# Patient Record
Sex: Male | Born: 1935 | Race: White | Hispanic: No | Marital: Married | Smoking: Never smoker
Health system: Southern US, Community
[De-identification: ages and names within clinical notes are randomized; demographics above are authoritative.]

## PROBLEM LIST (undated history)

## (undated) DIAGNOSIS — G4730 Sleep apnea, unspecified: Secondary | ICD-10-CM

## (undated) DIAGNOSIS — M1990 Unspecified osteoarthritis, unspecified site: Secondary | ICD-10-CM

## (undated) DIAGNOSIS — C61 Malignant neoplasm of prostate: Secondary | ICD-10-CM

## (undated) DIAGNOSIS — J449 Chronic obstructive pulmonary disease, unspecified: Secondary | ICD-10-CM

## (undated) DIAGNOSIS — F32A Depression, unspecified: Secondary | ICD-10-CM

## (undated) DIAGNOSIS — H409 Unspecified glaucoma: Secondary | ICD-10-CM

## (undated) DIAGNOSIS — F0390 Unspecified dementia without behavioral disturbance: Secondary | ICD-10-CM

## (undated) DIAGNOSIS — F329 Major depressive disorder, single episode, unspecified: Secondary | ICD-10-CM

## (undated) DIAGNOSIS — R55 Syncope and collapse: Secondary | ICD-10-CM

## (undated) HISTORY — DX: Depression, unspecified: F32.A

## (undated) HISTORY — DX: Unspecified glaucoma: H40.9

## (undated) HISTORY — DX: Unspecified osteoarthritis, unspecified site: M19.90

## (undated) HISTORY — DX: Syncope and collapse: R55

## (undated) HISTORY — DX: Chronic obstructive pulmonary disease, unspecified: J44.9

## (undated) HISTORY — DX: Major depressive disorder, single episode, unspecified: F32.9

## (undated) HISTORY — PX: HERNIA REPAIR: SHX51

## (undated) HISTORY — DX: Sleep apnea, unspecified: G47.30

---

## 1998-08-30 ENCOUNTER — Ambulatory Visit (HOSPITAL_BASED_OUTPATIENT_CLINIC_OR_DEPARTMENT_OTHER): Admission: RE | Admit: 1998-08-30 | Discharge: 1998-08-30 | Payer: Self-pay | Admitting: Surgery

## 2003-08-27 ENCOUNTER — Encounter: Payer: Self-pay | Admitting: Gastroenterology

## 2003-08-27 ENCOUNTER — Encounter: Admission: RE | Admit: 2003-08-27 | Discharge: 2003-08-27 | Payer: Self-pay | Admitting: Gastroenterology

## 2005-10-02 ENCOUNTER — Encounter: Admission: RE | Admit: 2005-10-02 | Discharge: 2005-10-02 | Payer: Self-pay | Admitting: Emergency Medicine

## 2005-10-02 IMAGING — CR DG CHEST 2V
2 series · 2 of 2 positions shown · non-contrast
Comparison: none

CLINICAL DATA: Cough. 
 PA AND LATERAL CHEST: 
 Lungs are clear.  Heart size is normal.  No effusion.  No focal bony abnormality.

[w chest pa]
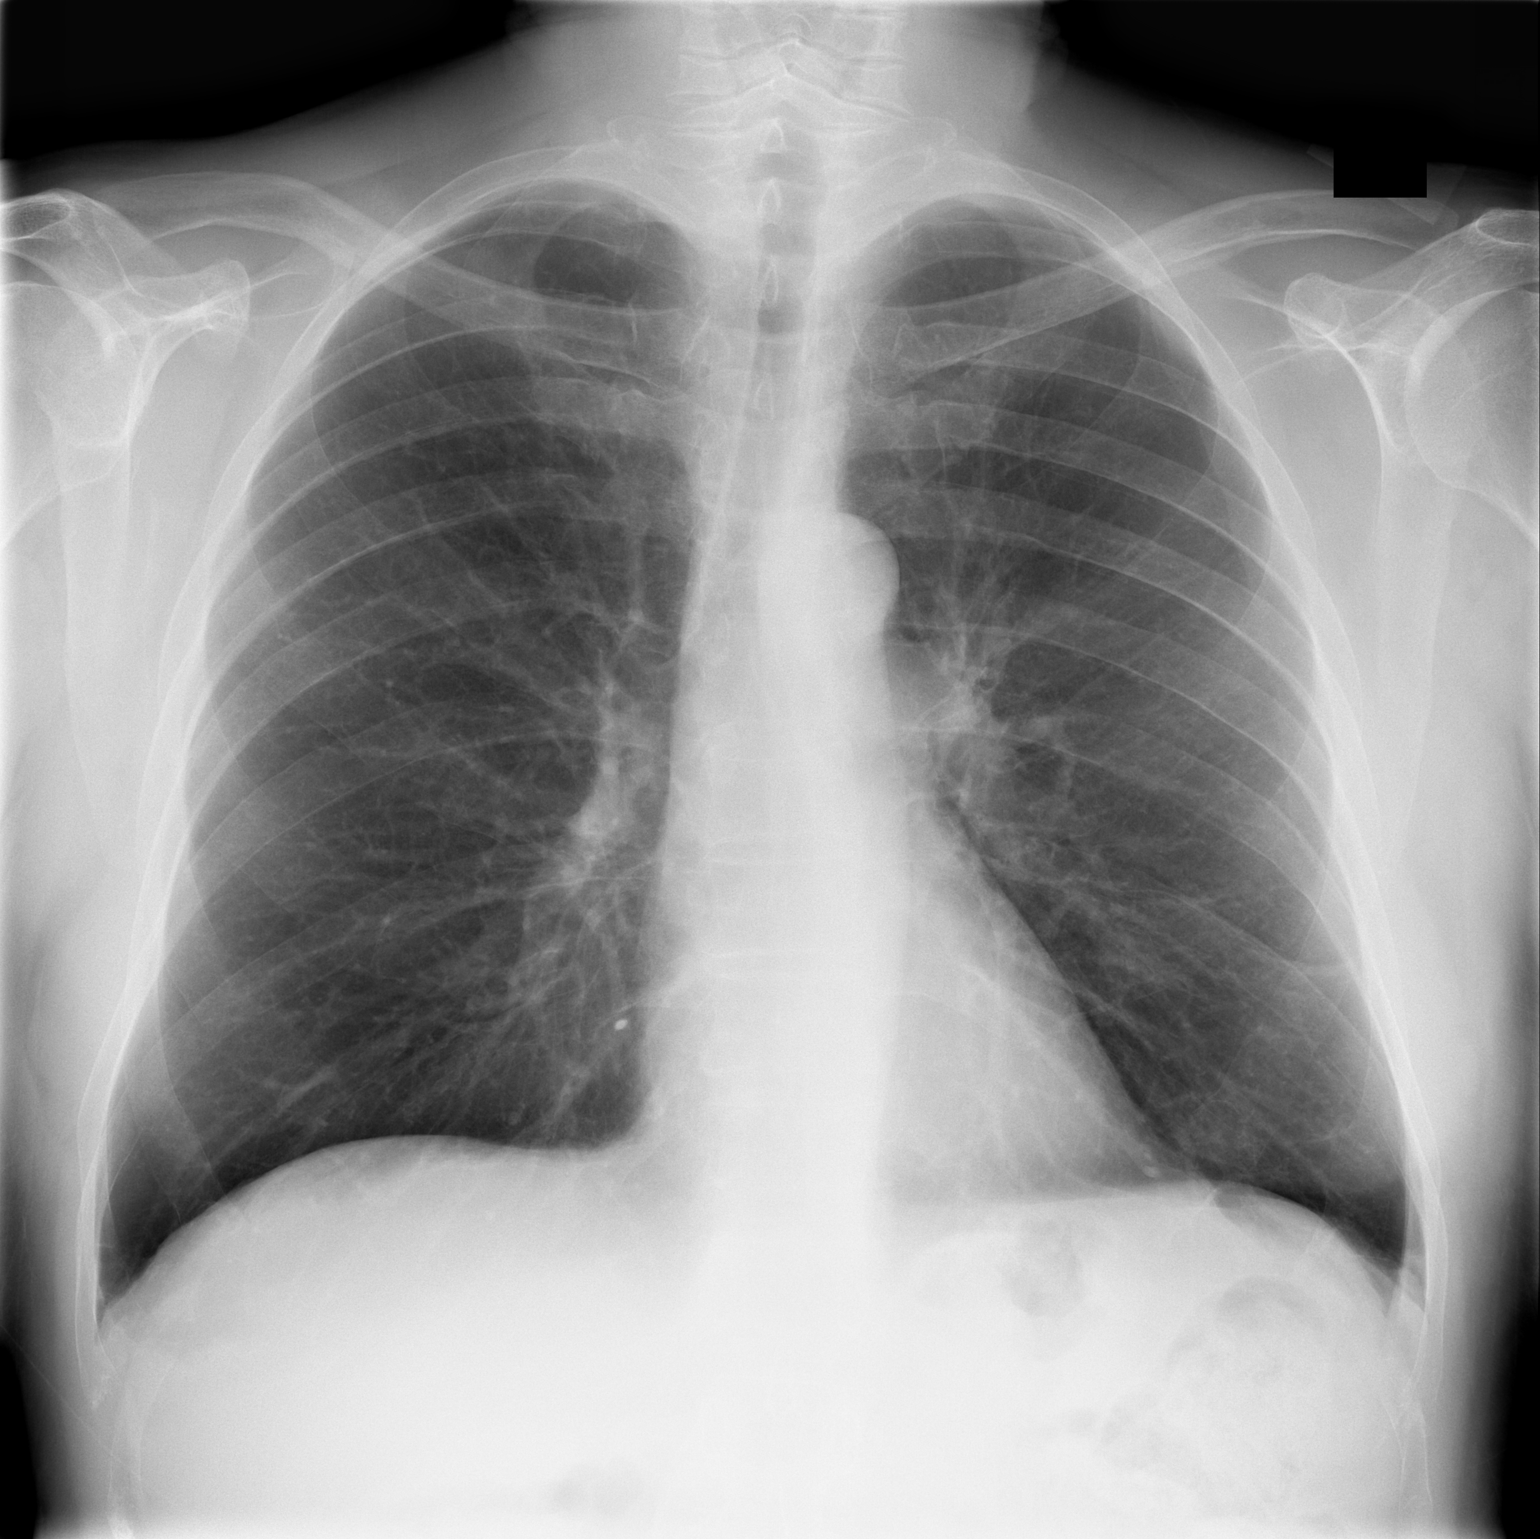

[w chest lat]
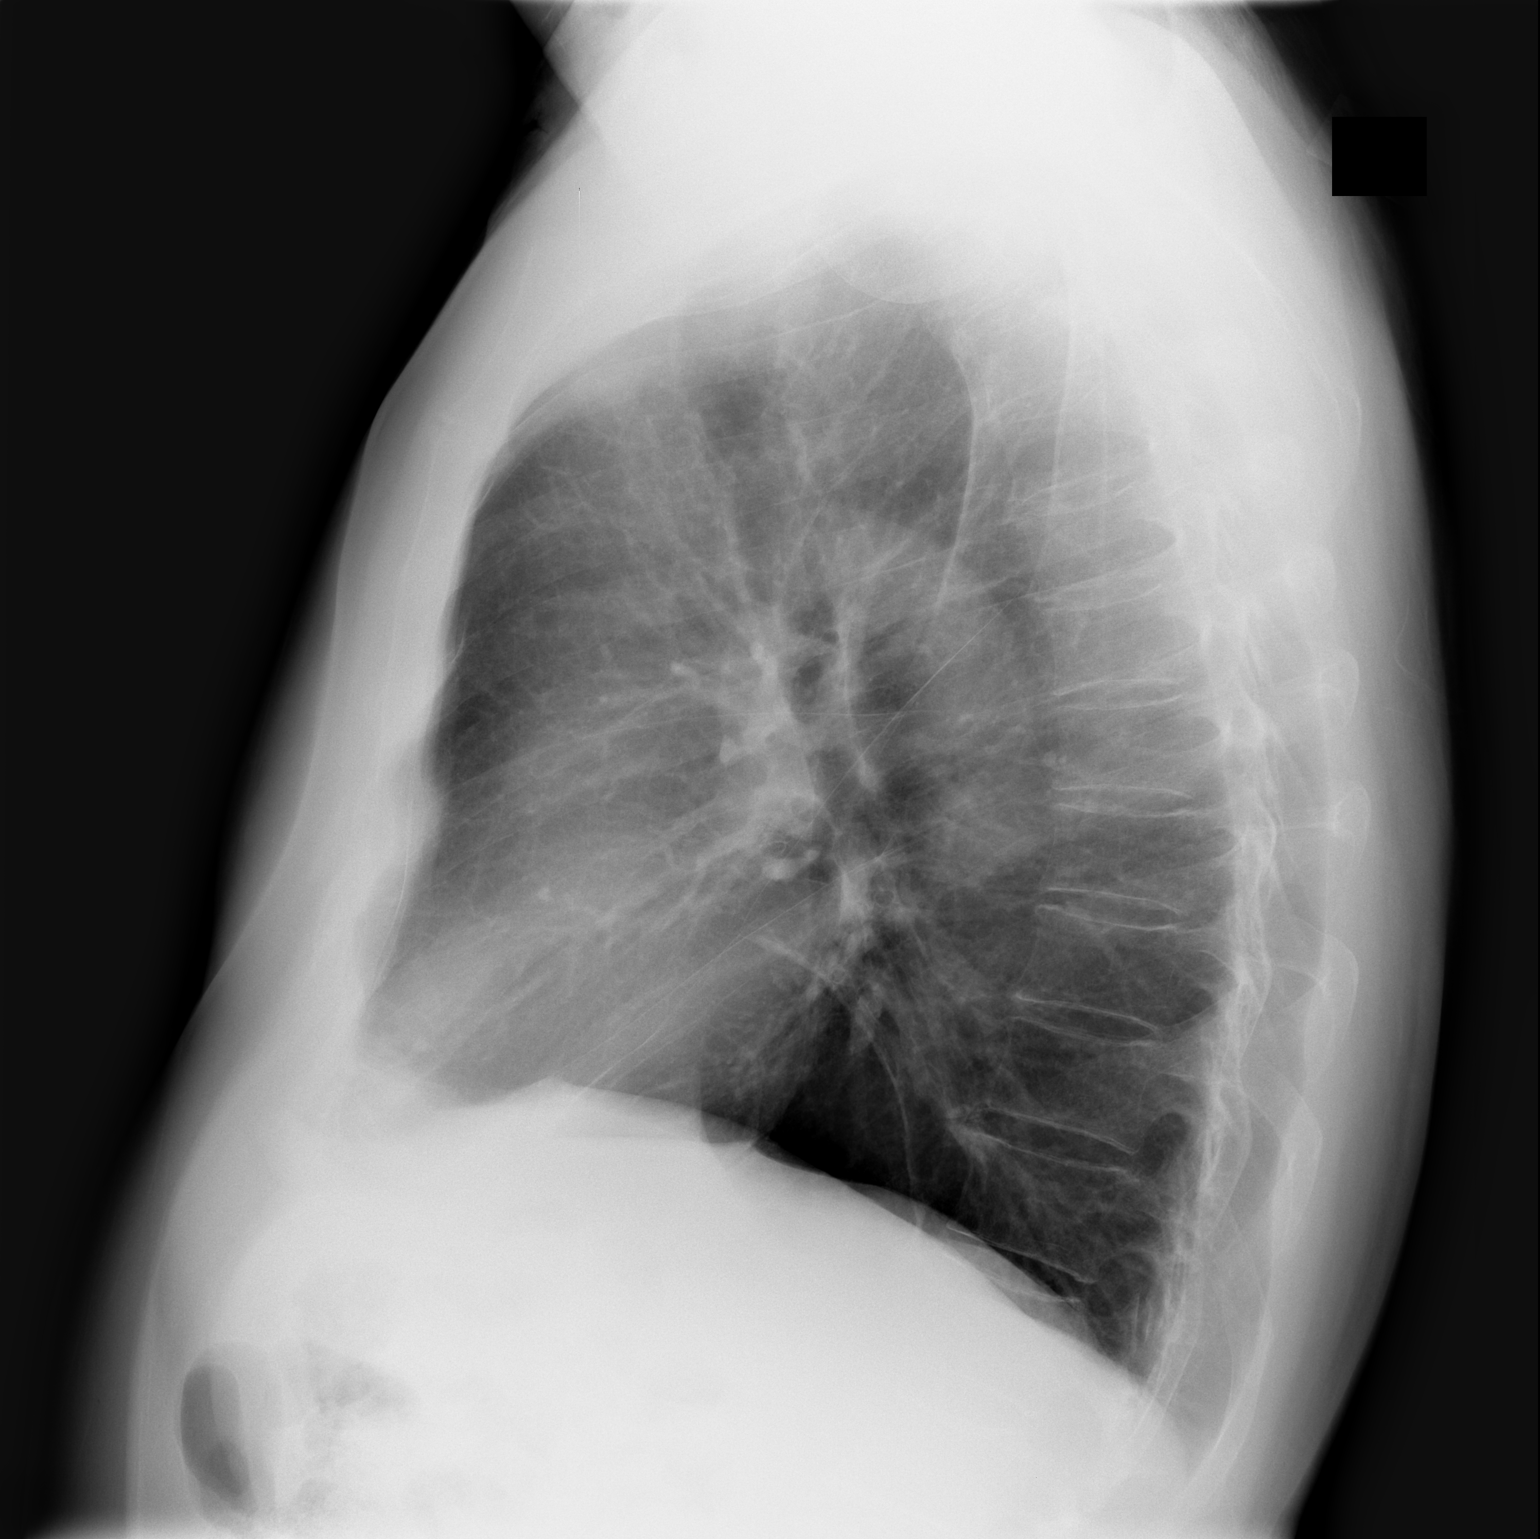

[2 of 2 positions shown; findings below may reference images not displayed]

IMPRESSION: No acute disease.

## 2008-03-26 ENCOUNTER — Encounter: Admission: RE | Admit: 2008-03-26 | Discharge: 2008-03-26 | Payer: Self-pay | Admitting: Emergency Medicine

## 2008-03-26 IMAGING — CT CT VIRTUAL COLONOSCOPY DIAGNOSTIC
2 of 7 series · 12 of 46 positions shown, 14 images · non-contrast
Comparison: None

CLINICAL DATA: The diverticulosis.  Redundant colon on barium
enema.  Diarrhea, screening.

CT VIRTUAL COLONOSCOPY FOR SCREENING:
TECHNIQUE: The patient was given a standard Lo-So bowel
preparation with Gastrografin and barium for fluid and stool
tagging respectively.  The quality of the bowel preparation is
excellent.  Automated CO2 insufflation of the colon was performed
prior to image acquisition and colonic distention is excellent.

[Series 6: prone · axial · 0.78mm/px · z∈[-500,-82]mm · 9 of 418 slices shown, 11 images]
[im 42/418  soft-tissue]
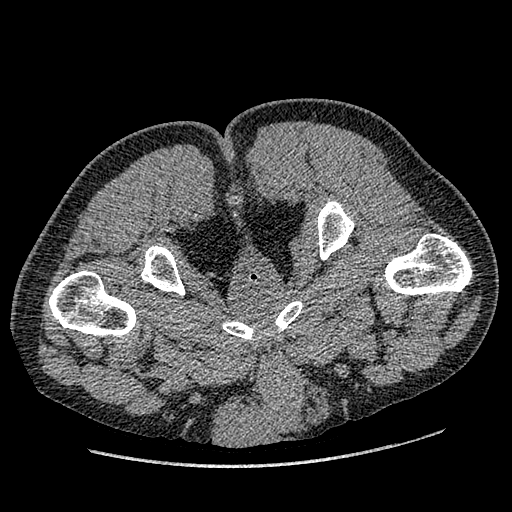
[im 42/418  bone]
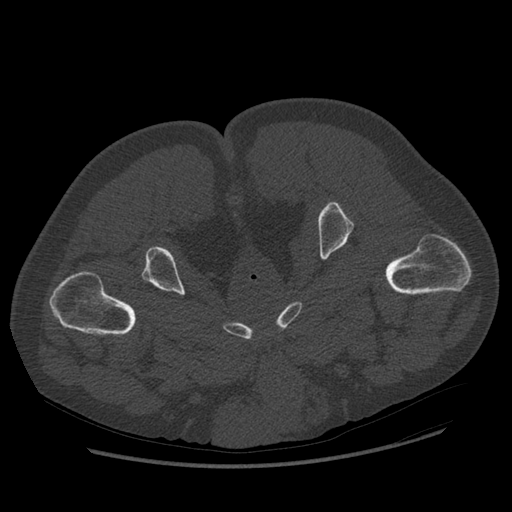
[im 84/418  soft-tissue]
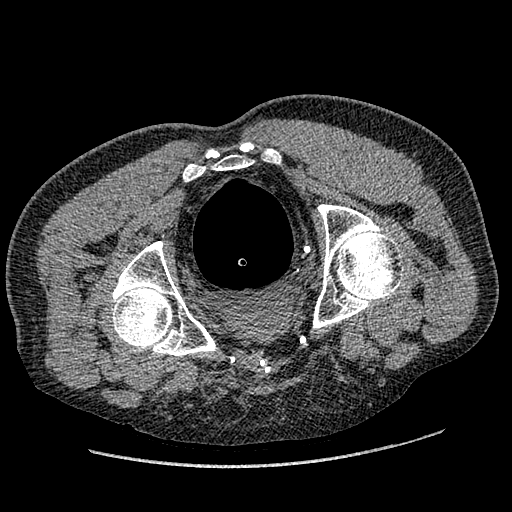
[im 126/418  soft-tissue]
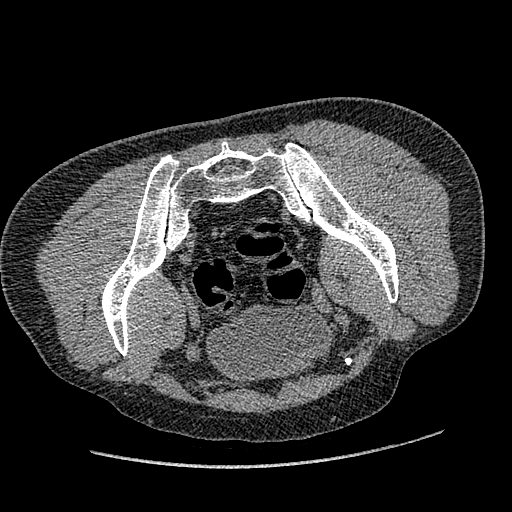
[im 167/418  soft-tissue]
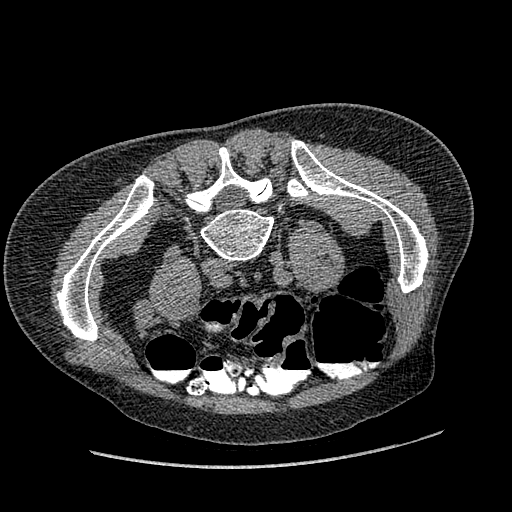
[im 209/418  soft-tissue]
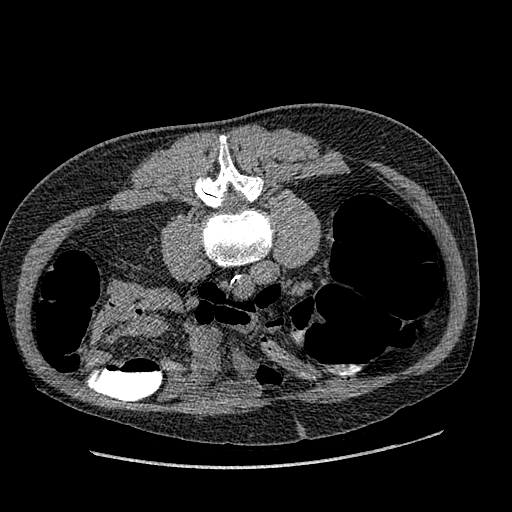
[im 251/418  soft-tissue]
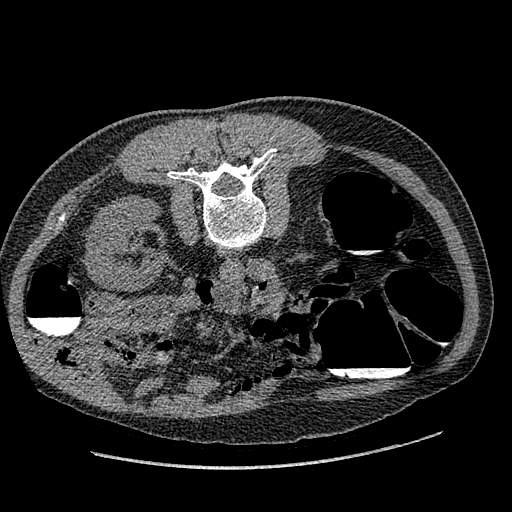
[im 292/418  soft-tissue]
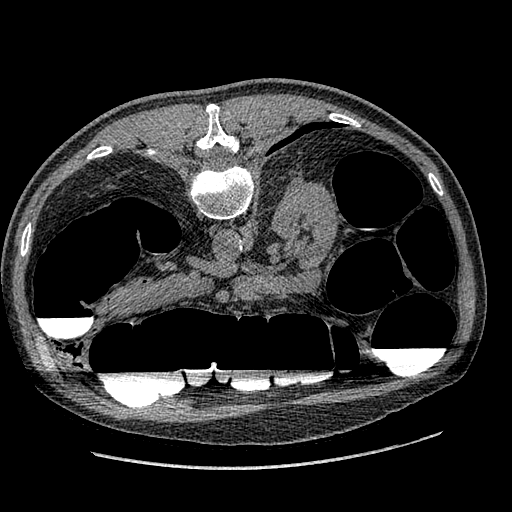
[im 334/418  soft-tissue]
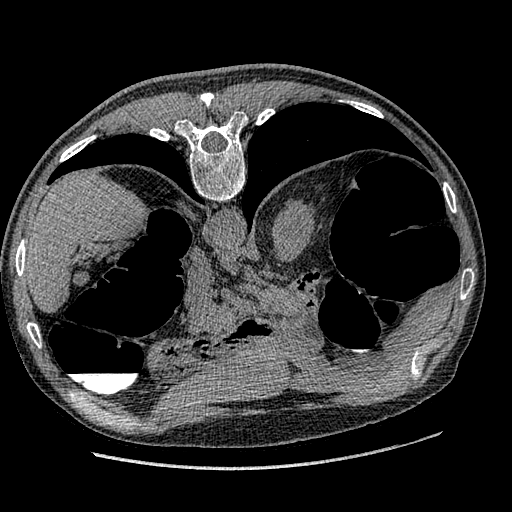
[im 376/418  soft-tissue]
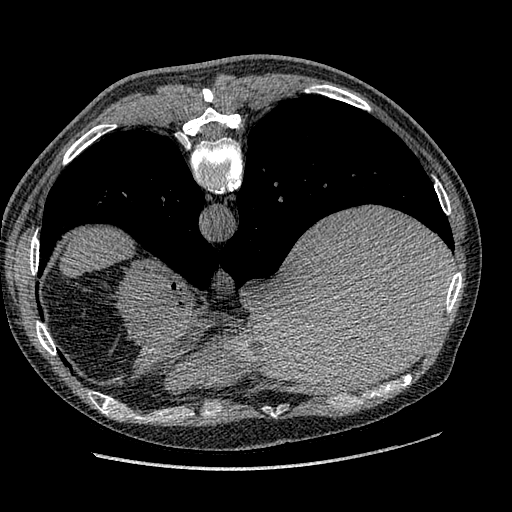
[im 376/418  bone]
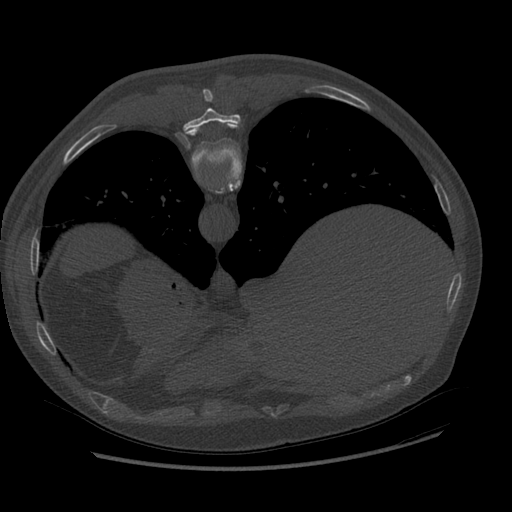

[Series 200: cor · coronal · 0.98mm/px · 3 of 145 slices shown]
[im 49/145  soft-tissue]
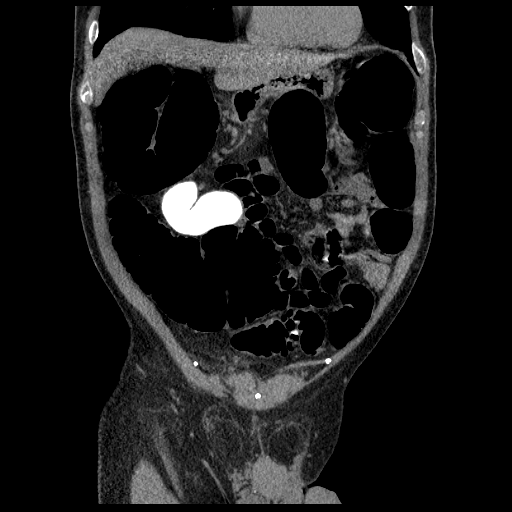
[im 65/145  soft-tissue]
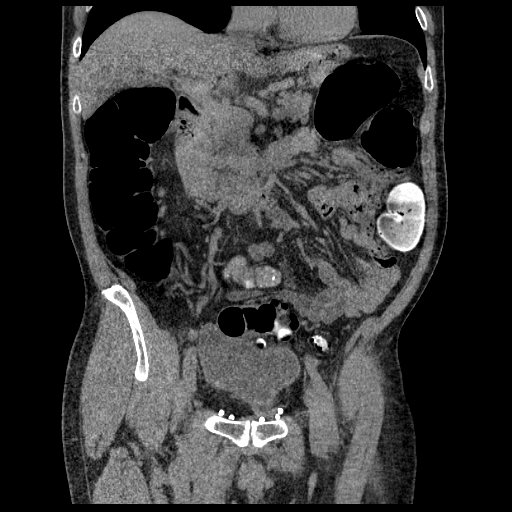
[im 81/145  soft-tissue]
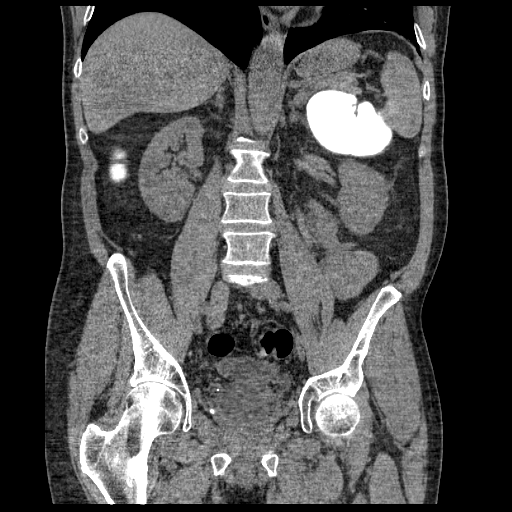

[12 of 46 positions shown; findings below may reference images not displayed]

FINDINGS: On the supine images, 178 cm from the anal verge within
the proximal to mid transverse colon, there is a 7 mm sessile
polypoid area noted only on the supine images.  This area cannot be
confirmed with the prone images.  This does not appear to contain
barium.  This could represent a small 7 mm polyp.

No other suspicious polypoid lesions.  No strictures or masses.
Scattered diverticula throughout the sigmoid and distal descending
colon.
IMPRESSION: Possible 7 mm polyp in the proximal to mid transverse colon 178 cm
from the anal verge.  This is only seen on the supine view.

This is a C-RADS category C2:  Intermediate polyp 6-9 mm.
Recommend continued surveillance or colonoscopy.

Virtual colonoscopy is not designed to detect diminutive polyps
(i.e., less than or equal to 5 mm), the presence or absence of
which may not affect clinical management

CT ABDOMEN AND PELVIS WITHOUT CONTRAST

CT ABDOMEN
FINDINGS: Linear scarring or atelectasis in the left lung base.
Right lung base clear.

Small low-density lesion in the dome of the liver measuring 14 mm,
nonspecific.  This most likely represents a small benign cyst
statistically.  Spleen, pancreas, adrenals unremarkable.  Small
nonobstructing stone in the mid pole of the right kidney.  No
stones on the left.  Kidneys otherwise unremarkable.

Minimal atherosclerotic calcifications in the abdominal aorta.  No
aneurysm.  No free fluid, free air, or adenopathy.
IMPRESSION: 14 mm nonspecific low density lesion in the dome of the liver.
Statistically, this most likely represents small cyst.

Punctate nonobstructing right mid pole renal stone.

CT PELVIS
FINDINGS: Bilateral inguinal hernias present containing fat.  No
free fluid, free air, or adenopathy.
IMPRESSION: Bilateral inguinal hernias containing fat.

## 2008-07-06 ENCOUNTER — Encounter: Admission: RE | Admit: 2008-07-06 | Discharge: 2008-07-06 | Payer: Self-pay | Admitting: Emergency Medicine

## 2008-07-06 IMAGING — CR DG CHEST 2V
2 series · 2 of 2 positions shown · non-contrast
Comparison: Chest x-ray of [DATE]

CLINICAL DATA: Cough, chest pain, smoking history

CHEST - 2 VIEW

[w chest pa]
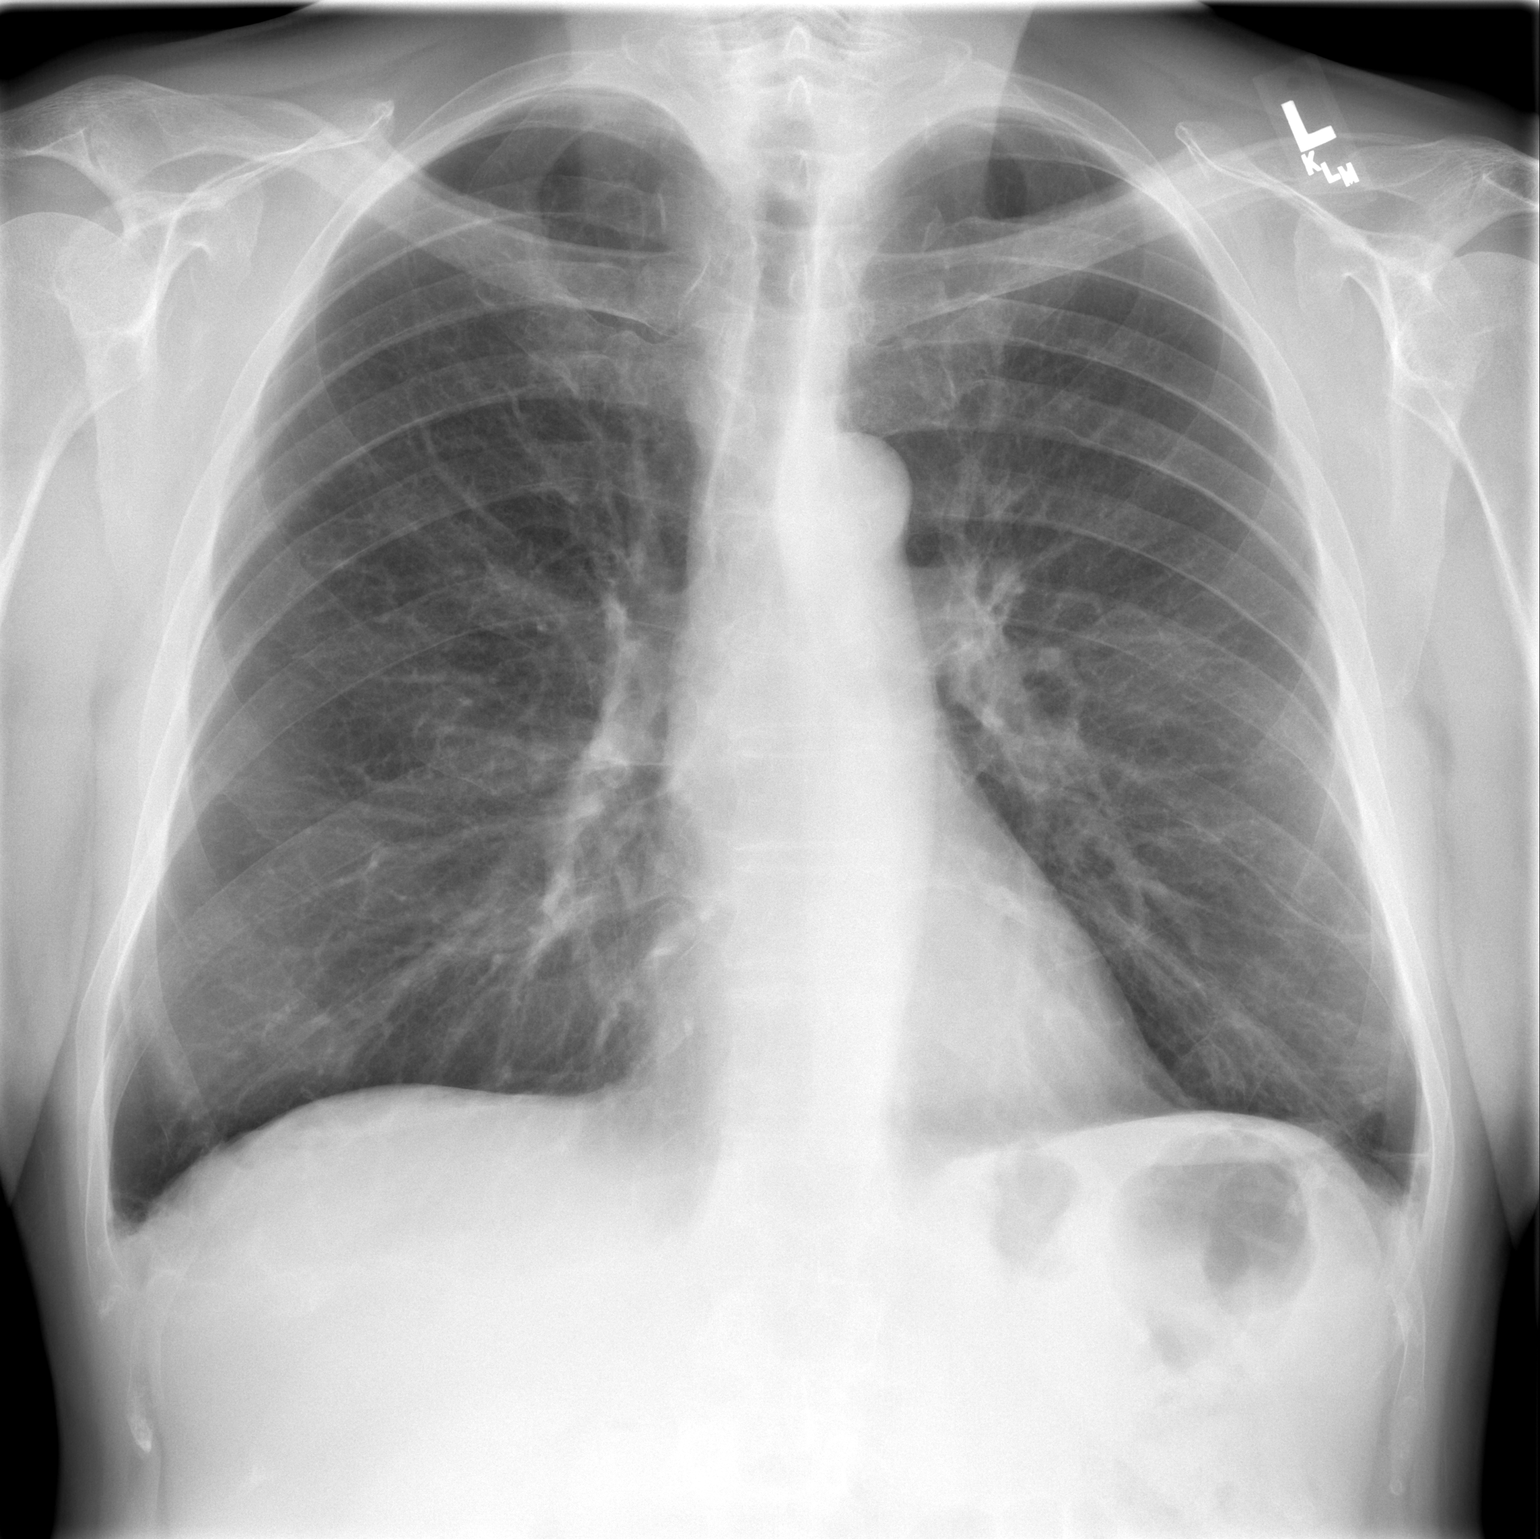

[w chest lat]
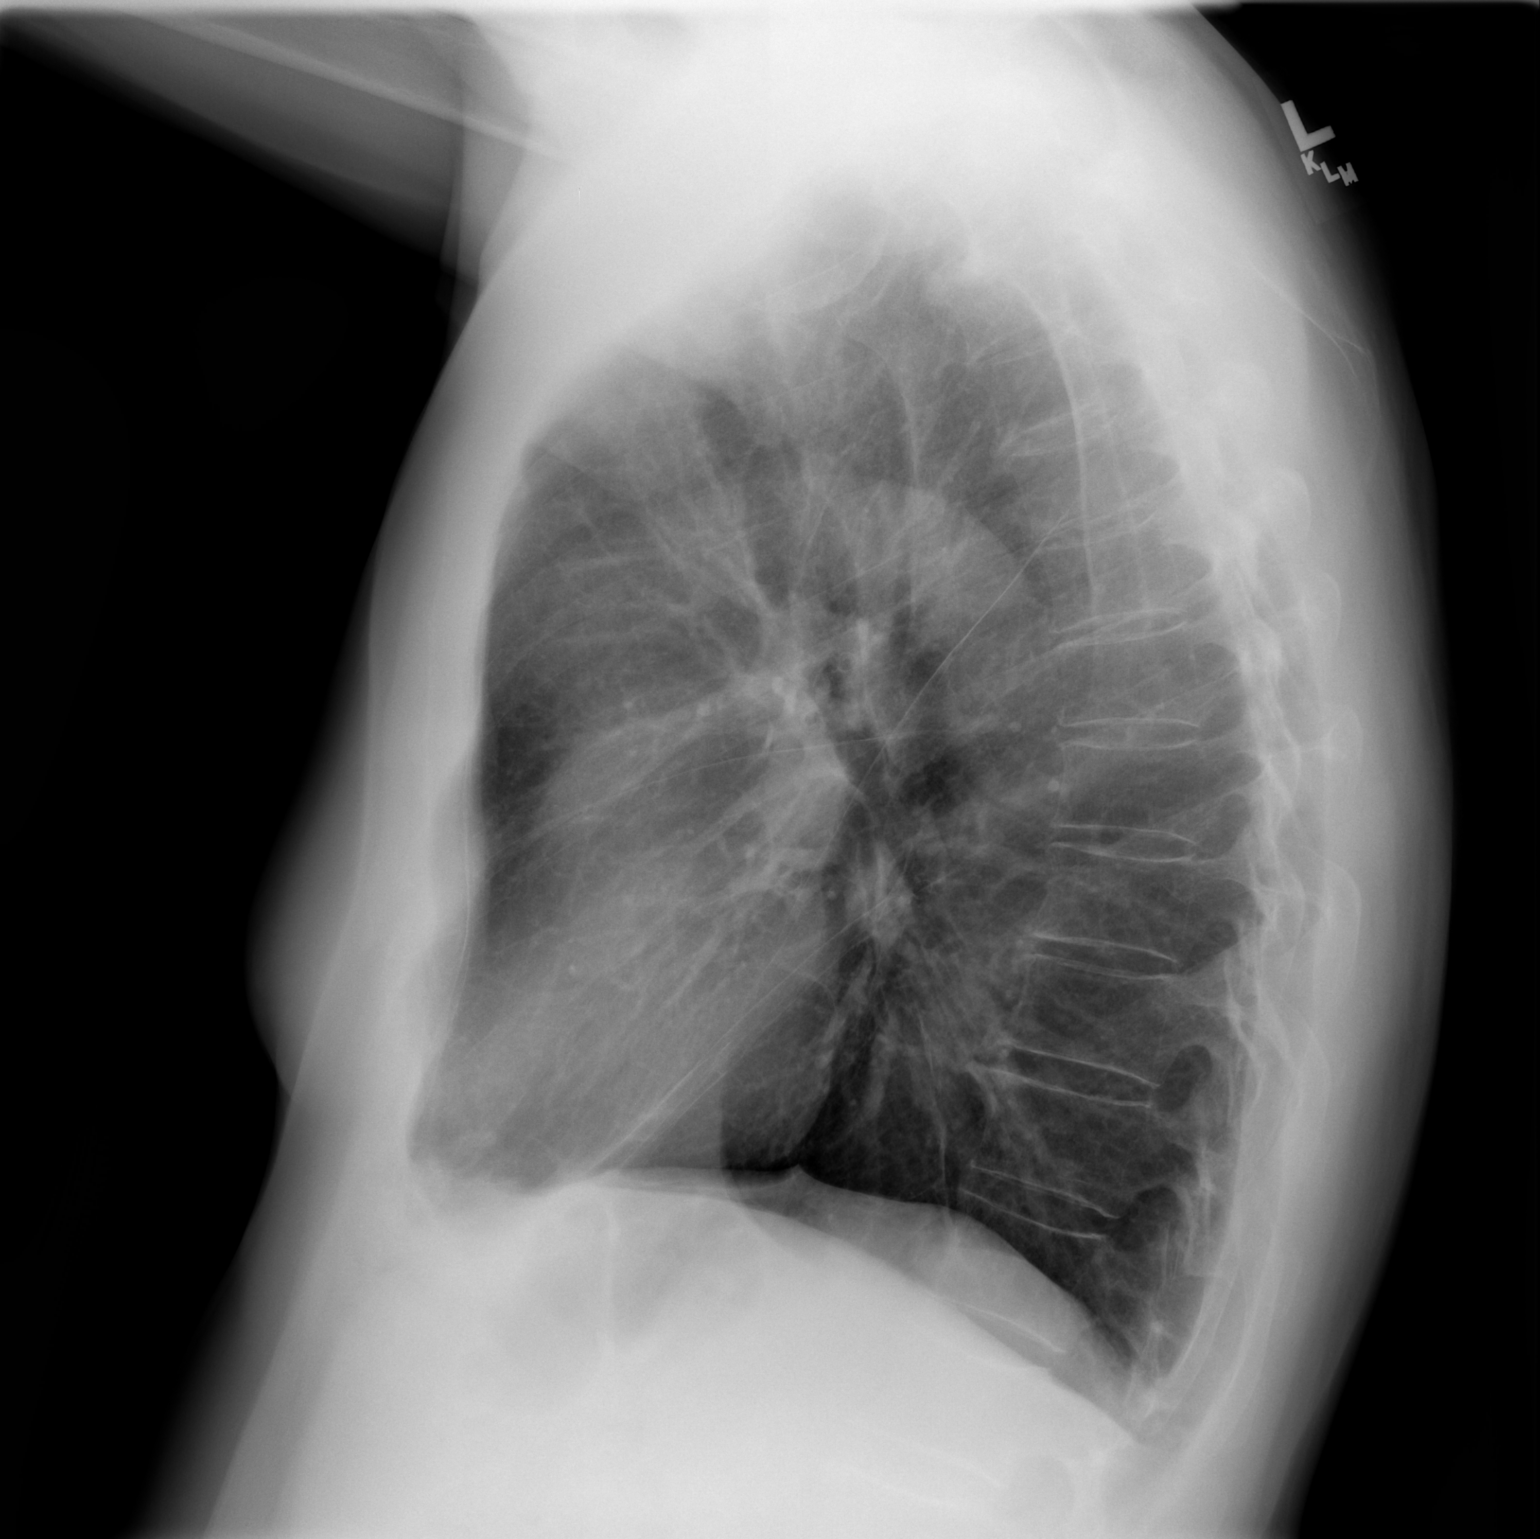

[2 of 2 positions shown; findings below may reference images not displayed]

FINDINGS: The lungs are clear and slightly hyperaerated.  The heart
is within normal limits in size.  No bony abnormality is seen.
IMPRESSION: No active lung disease.  Slight hyperaeration.

## 2008-08-02 ENCOUNTER — Encounter: Admission: RE | Admit: 2008-08-02 | Discharge: 2008-08-02 | Payer: Self-pay | Admitting: Surgery

## 2008-08-02 IMAGING — CT CT PELVIS W/ CM
2 of 3 series · 17 of 46 positions shown, 19 images · IV contrast (READICAT/WATER & [ID] OMNI 300)
Comparison: [DATE]

CLINICAL DATA: Recurrent bilateral inguinal hernias.

CT PELVIS WITH CONTRAST
TECHNIQUE: Multidetector CT imaging of the pelvis was performed
following the standard protocol during administration of
intravenous contrast.
Contrast: 100 ml [3O]

[Series 3: routine pelvis · axial · 0.77mm/px · z∈[-285,-45]mm · 14 of 53 slices shown, 16 images]
[im 4/53  soft-tissue]
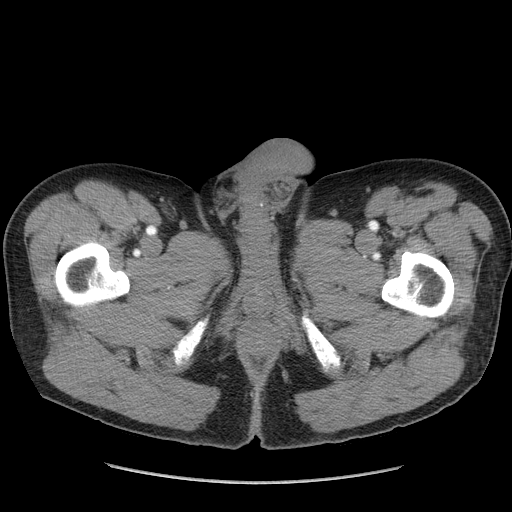
[im 4/53  bone]
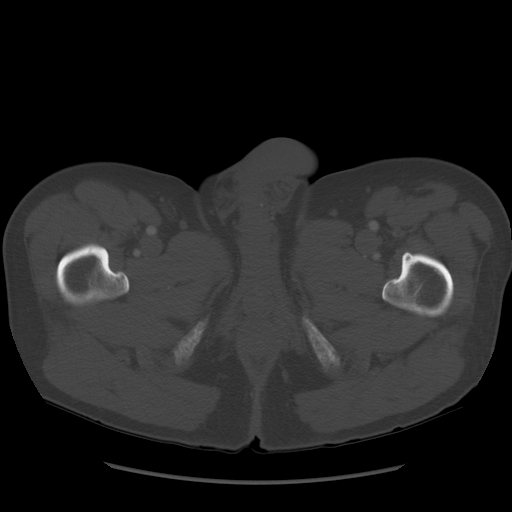
[im 7/53  soft-tissue]
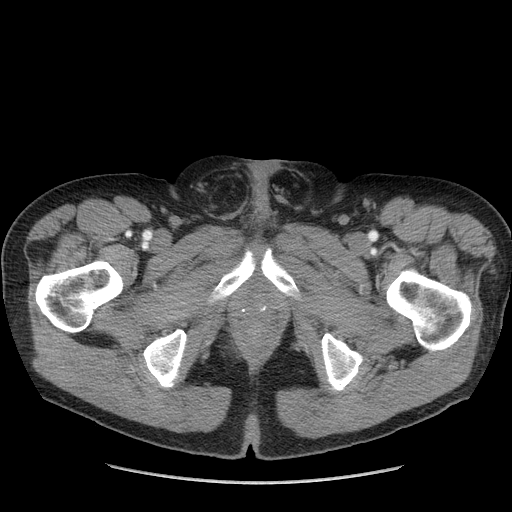
[im 11/53  soft-tissue]
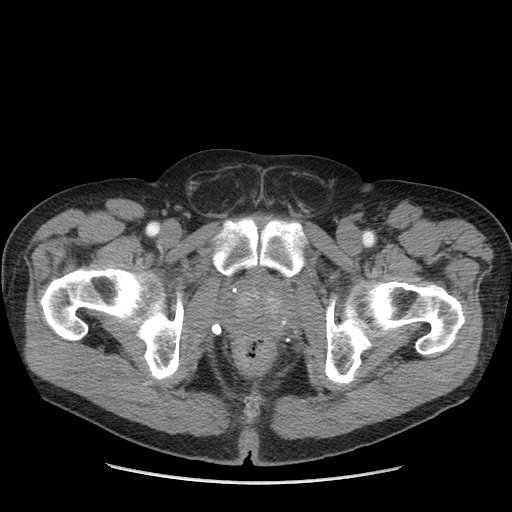
[im 14/53  soft-tissue]
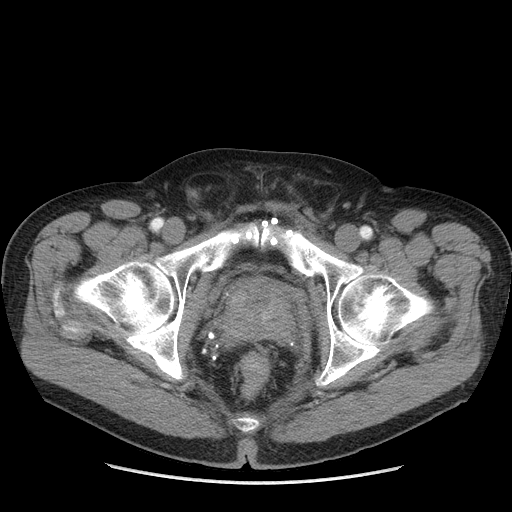
[im 17/53  soft-tissue]
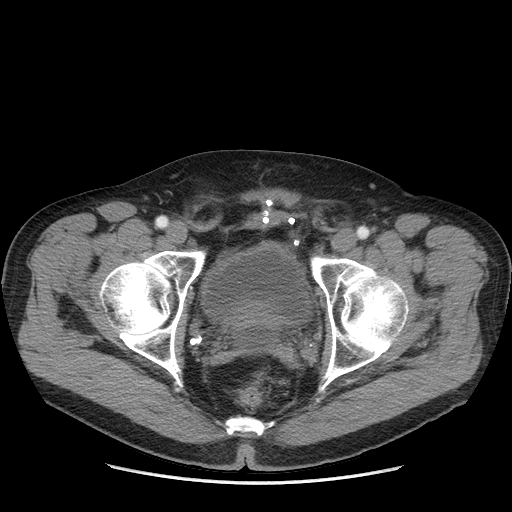
[im 21/53  soft-tissue]
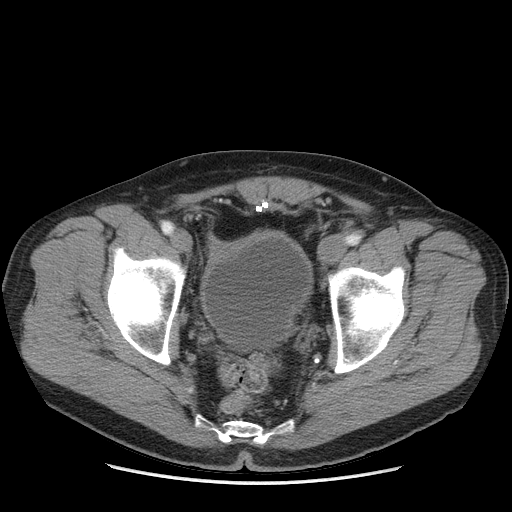
[im 24/53  soft-tissue]
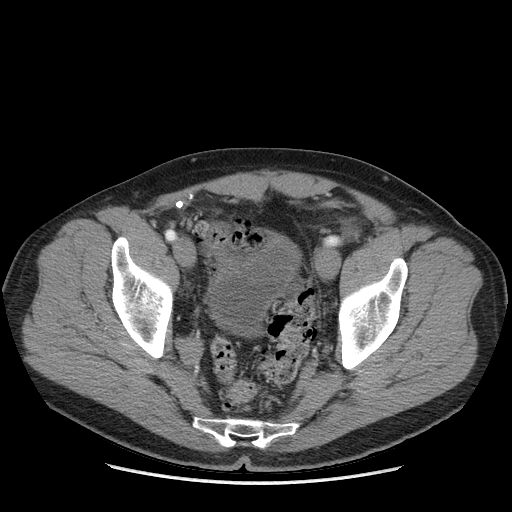
[im 29/53  soft-tissue]
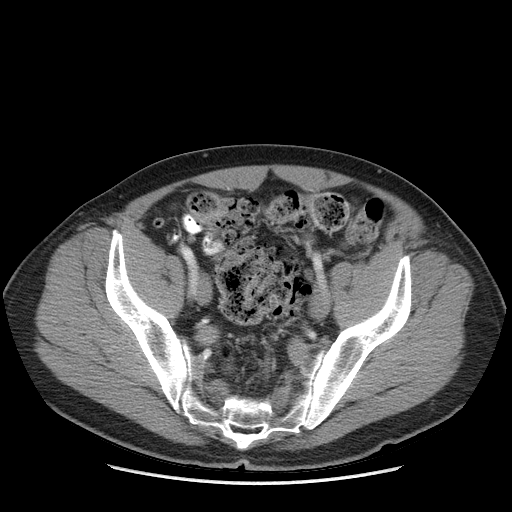
[im 32/53  soft-tissue]
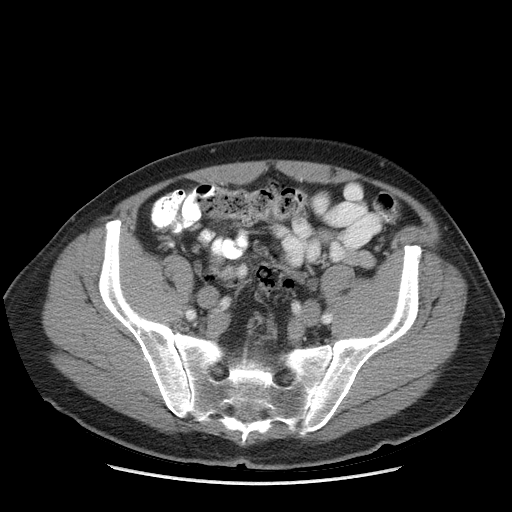
[im 32/53  bone]
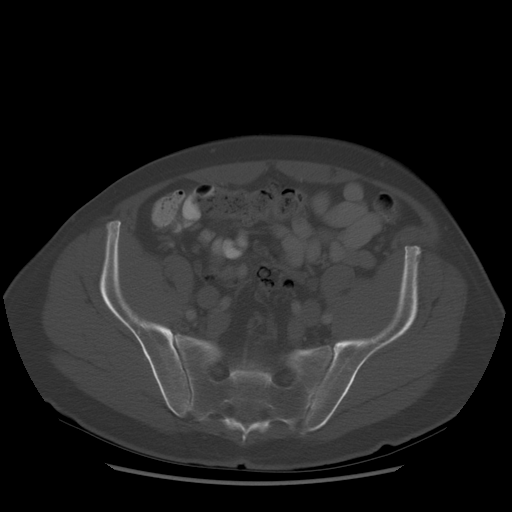
[im 36/53  soft-tissue]
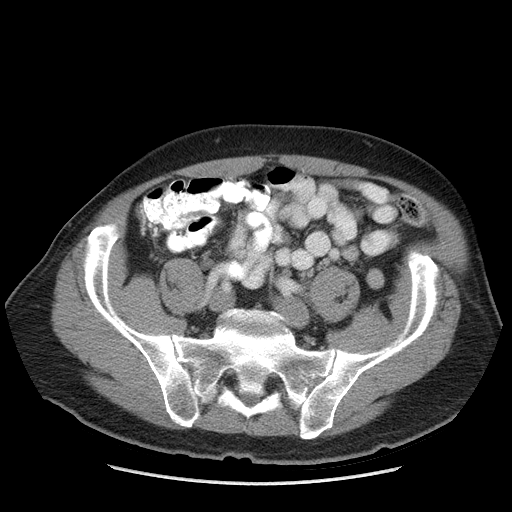
[im 39/53  soft-tissue]
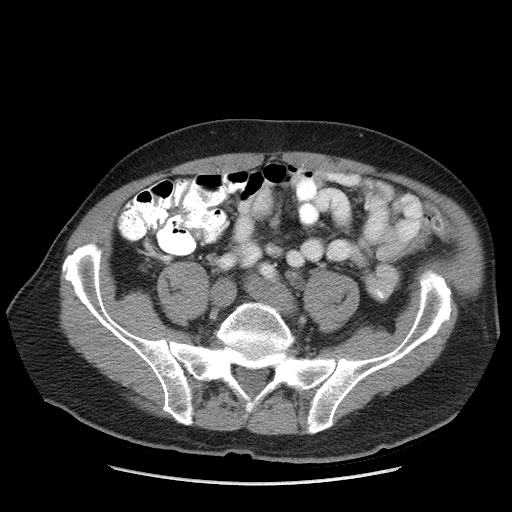
[im 42/53  soft-tissue]
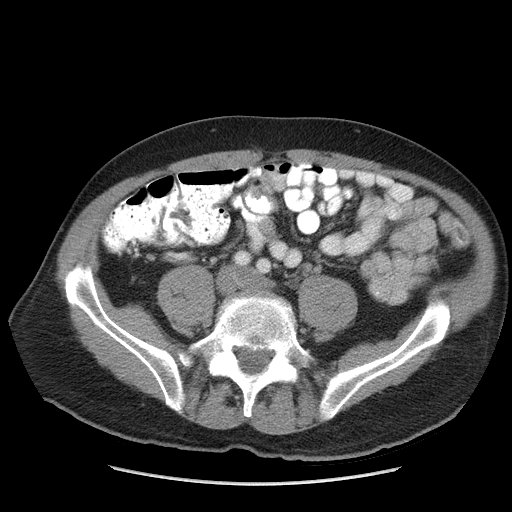
[im 46/53  soft-tissue]
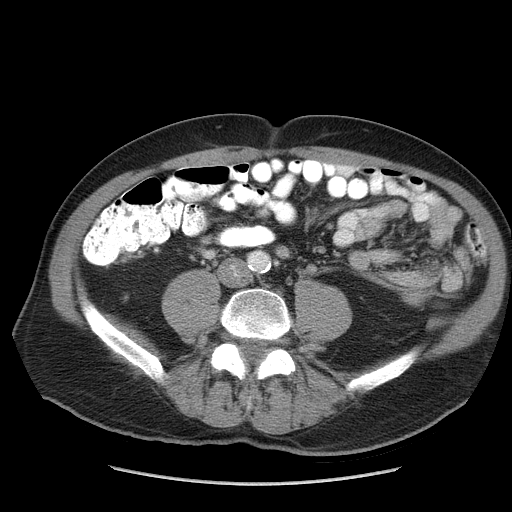
[im 49/53  soft-tissue]
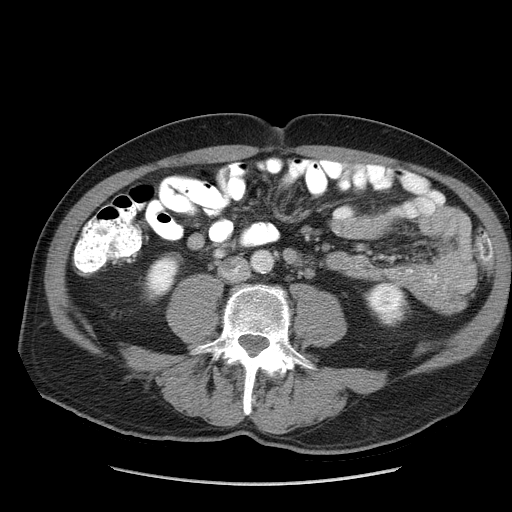

[Series 602: sagittal body · sagittal · 0.77mm/px · 3 of 158 slices shown]
[im 53/158  soft-tissue]
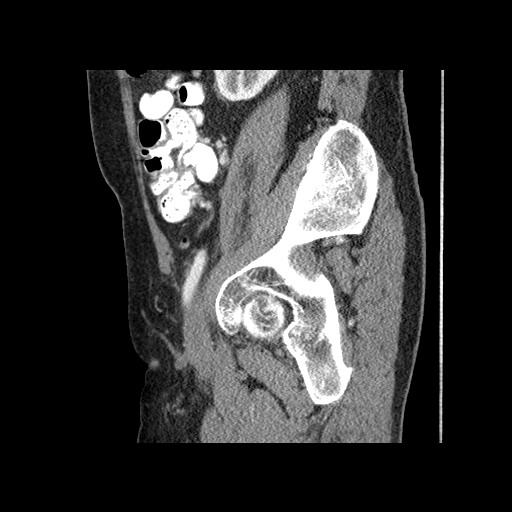
[im 70/158  soft-tissue]
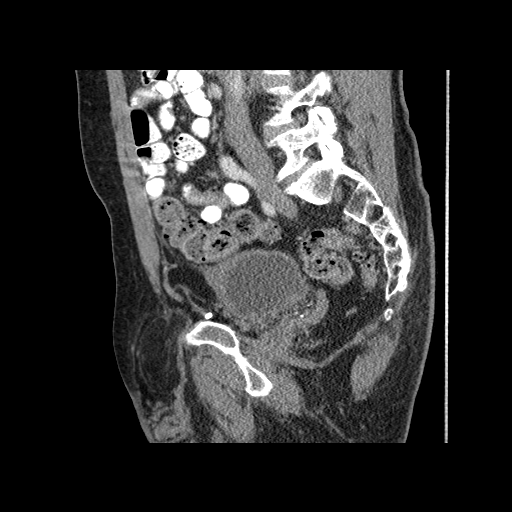
[im 88/158  soft-tissue]
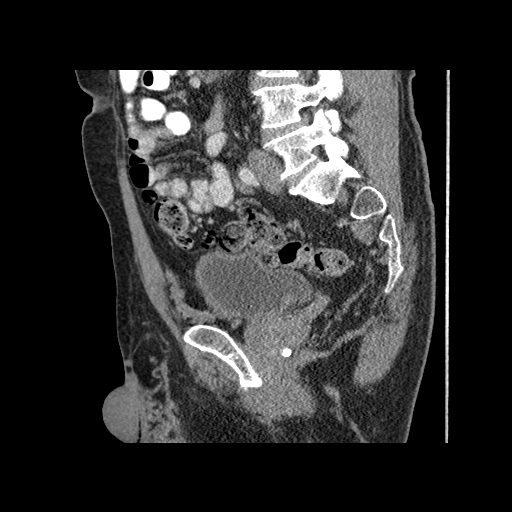

[17 of 46 positions shown; findings below may reference images not displayed]

FINDINGS: There are bilateral inguinal hernias containing fat,
similar in appearance to [DATE].  Evidence of previous hernia
repair is noted bilaterally.  Prostate is mildly enlarged and
somewhat heterogeneous, measuring approximately 5.4 cm.  Anterior
bladder wall appears mildly thickened. Visualized bowel
unremarkable.  Visualized kidneys are also unremarkable.  No
pathologically enlarged lymph nodes.  No free fluid.  No worrisome
lytic or sclerotic lesions.
IMPRESSION: 1.  Stable bilateral inguinal hernias contain fat.
2.  Mildly enlarged and heterogeneous prostate with anterior
bladder wall thickening.

## 2009-04-22 ENCOUNTER — Encounter: Admission: RE | Admit: 2009-04-22 | Discharge: 2009-04-22 | Payer: Self-pay | Admitting: Gastroenterology

## 2009-04-22 IMAGING — CT CT PELVIS W/ CM
3 of 5 series · 12 of 36 positions shown, 18 images · IV contrast (READICAT/WATER & [ID] OMNI 300)
Comparison: CT abdomen pelvis [DATE]

CT HEAD

Addendum Begins

Additional correction:  The CT ABDOMEN section is mislabeled CT
HEAD and the CT PELVIS section is mislabeled CT ABDOMEN.
Addendum Ends
Incorrect EXAM and TECHNIQUE in original report.
Correction:
CT ABDOMEN AND PELVIS WITH CONTRAST
TECHNIQUE: Multidetector CT imaging of the abdomen and pelvis was
performed using the standard protocol following bolus
administration of intravenous contrast.
CLINICAL DATA: Abdominal pain, diarrhea, concern for
diverticulitis.
CT HEAD WITHOUT CONTRAST
TECHNIQUE: Multidetector CT imaging of the head was performed
following the standard protocol without intravenous contrast.
Multidetector CT imaging of the abdomen and pelvis was performed
following the standard protocol during bolus administration of
intravenous contrast.
Contrast: 100 ml Omnipaque 300

[Series 3: routine abdomen · axial · 0.80mm/px · z∈[-451,-91]mm · 8 of 94 slices shown, 13 images]
[im 11/94  soft-tissue]
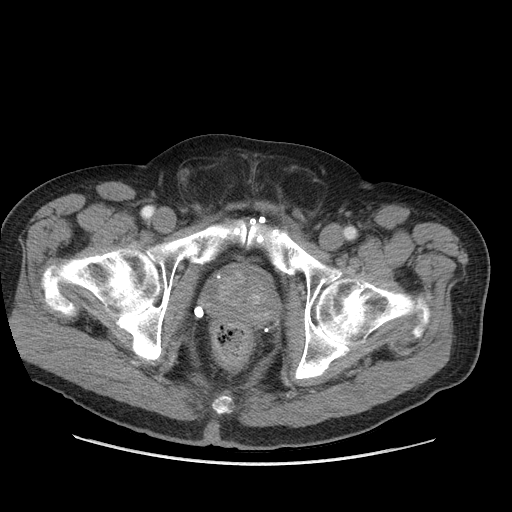
[im 11/94  bone]
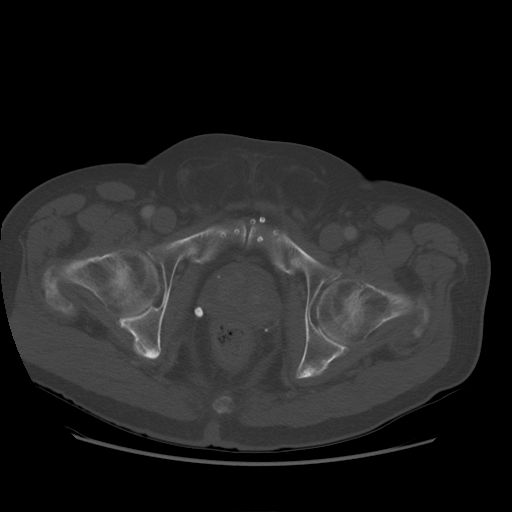
[im 21/94  soft-tissue]
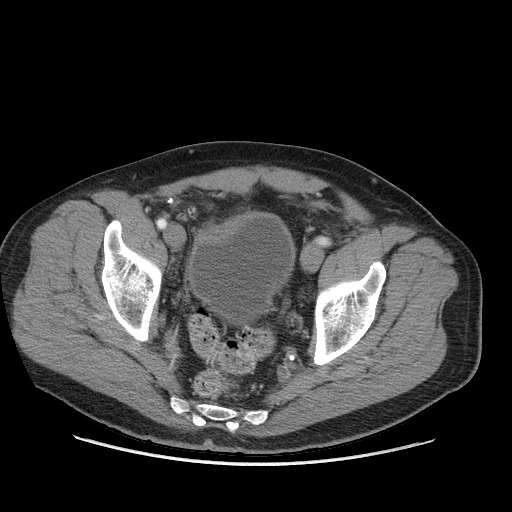
[im 32/94  soft-tissue]
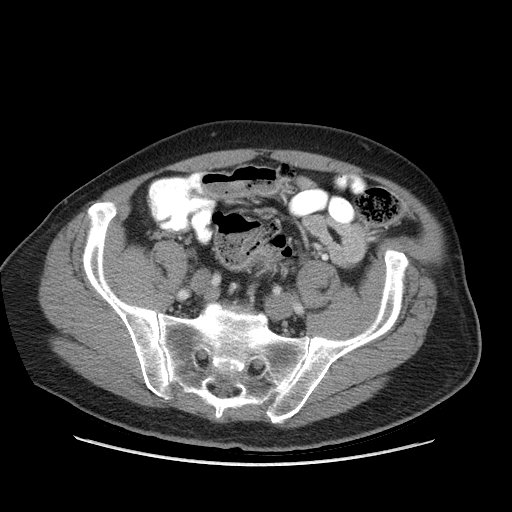
[im 42/94  soft-tissue]
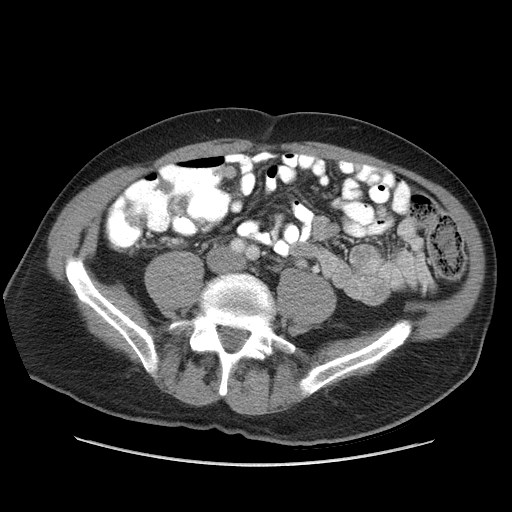
[im 52/94  soft-tissue]
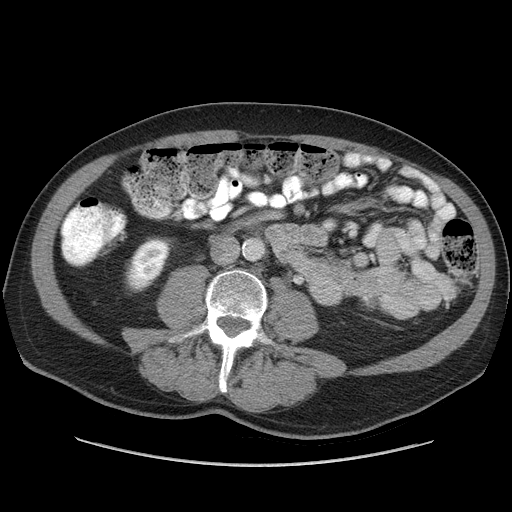
[im 52/94  lung]
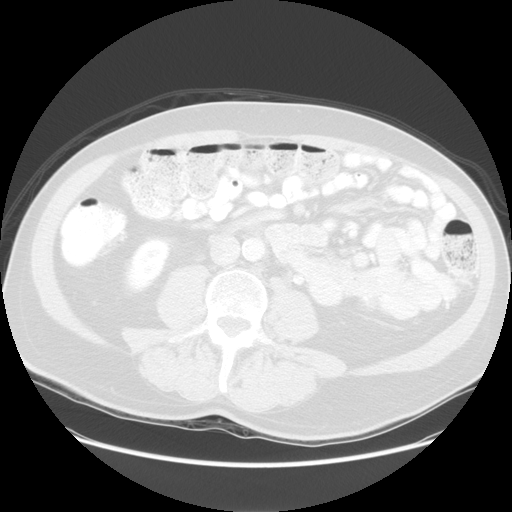
[im 63/94  soft-tissue]
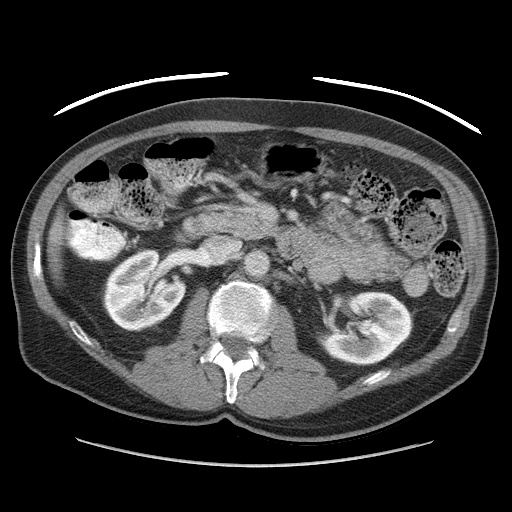
[im 63/94  lung]
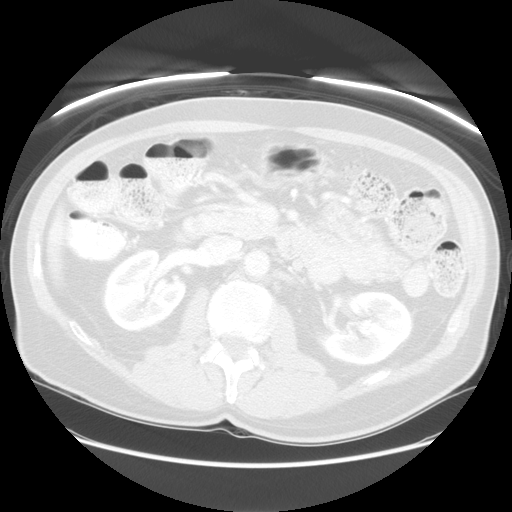
[im 73/94  soft-tissue]
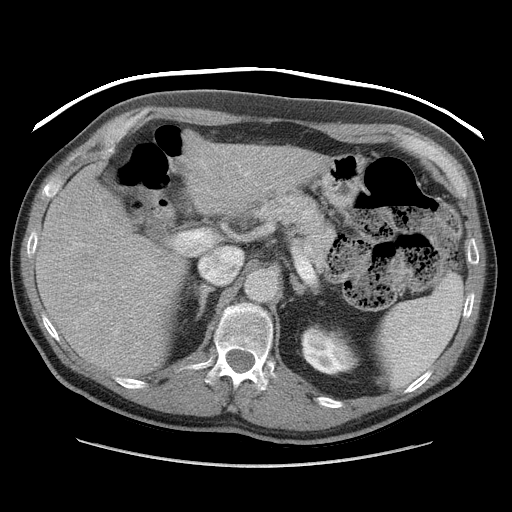
[im 73/94  lung]
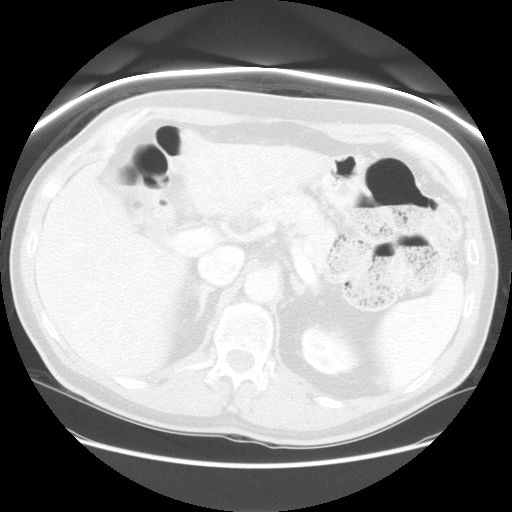
[im 83/94  soft-tissue]
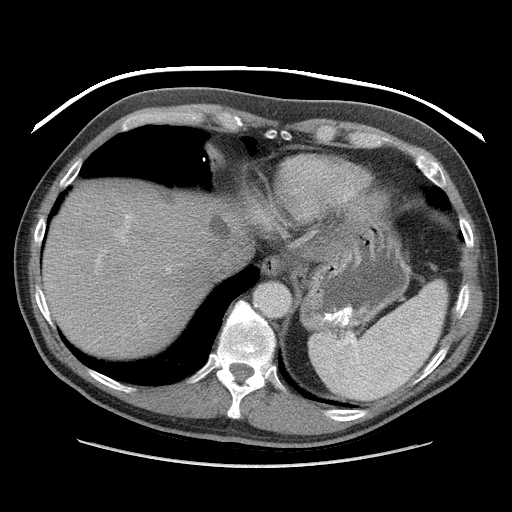
[im 83/94  lung]
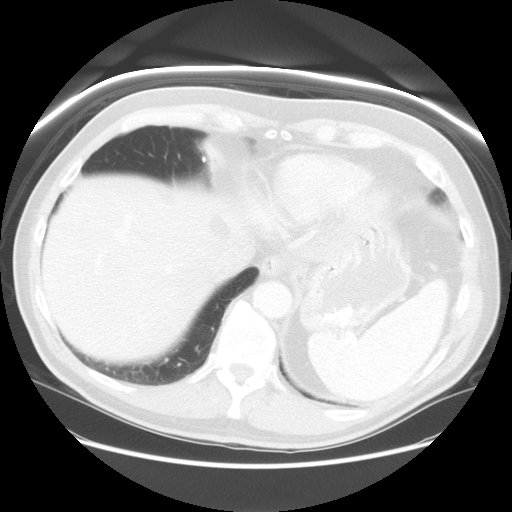

[Series 601: coronal body · coronal · 0.96mm/px · 1 of 121 slices shown, 2 images]
[im 41/121  soft-tissue]
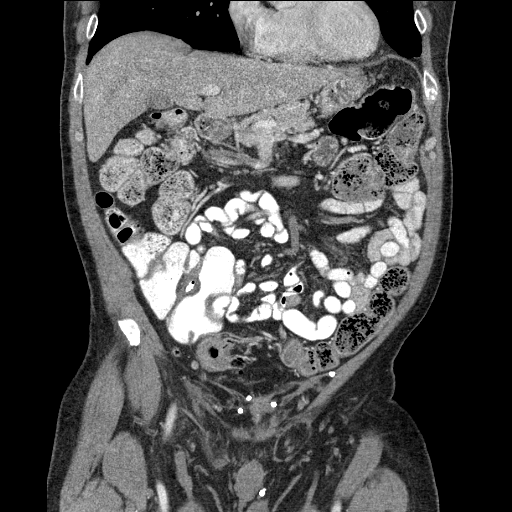
[im 41/121  bone]
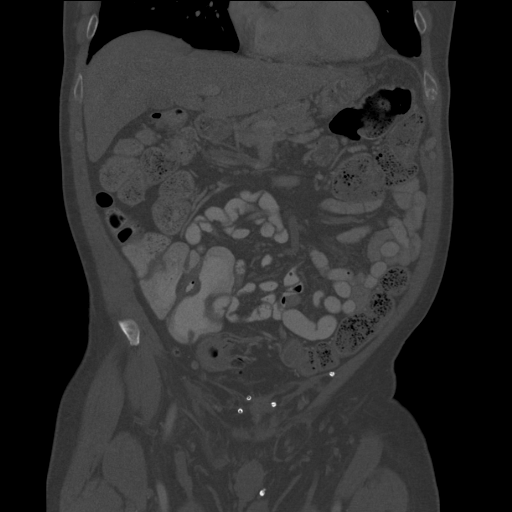

[Series 602: sagittal body · sagittal · 0.96mm/px · 3 of 165 slices shown]
[im 11/165  soft-tissue]
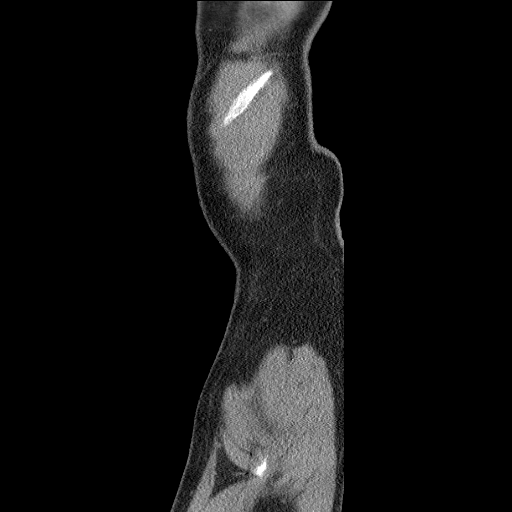
[im 33/165  soft-tissue]
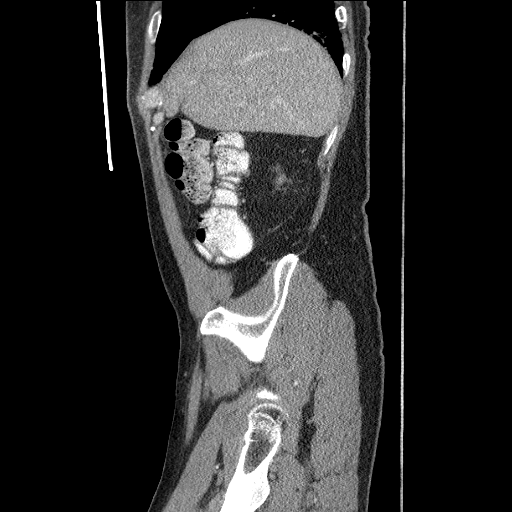
[im 55/165  soft-tissue]
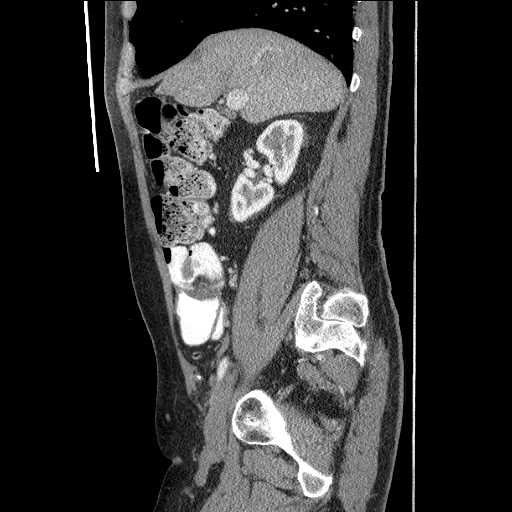

[12 of 36 positions shown; findings below may reference images not displayed]

FINDINGS: There is mild increase in basilar atelectasis compared
to prior.  No pericardial or pleural effusion.  18 mm hypodensity
in the superior left hepatic lobe has simple fluid attenuation and
is unchanged from prior.  The gallbladder, pancreas, spleen,
adrenal glands, and kidneys appear normal.

The stomach, small bowel, cecum, and appendix appear normal.  There
is a moderate volume of stool throughout the entirety of the colon.
No evidence of diverticulitis.

Abdominal aorta is normal caliber.  No evidence of abdominal
lymphadenopathy.
IMPRESSION: 1.  No acute abdominal process evident.
2.  Moderate volume of stool throughout the colon could represent
constipation.
3.  Stable hepatic cyst.
4.  No evidence of diverticulitis.

CT ABDOMEN
FINDINGS: The bladder and prostate appear normal.  The rectum
appears normal.  There are scattered diverticula of the sigmoid
colon without evidence of acute inflammation.  No free fluid in the
pelvis.

No evidence of pelvic lymphadenopathy.  Bilateral fat filled
inguinal hernias. Review of  bone windows demonstrates no
aggressive osseous lesions.
IMPRESSION: 1.  Sigmoid diverticulosis without evidence acute diverticulitis.

2.  Bilateral fat filled inguinal hernias.

## 2011-12-05 DIAGNOSIS — R972 Elevated prostate specific antigen [PSA]: Secondary | ICD-10-CM | POA: Diagnosis not present

## 2011-12-11 DIAGNOSIS — R972 Elevated prostate specific antigen [PSA]: Secondary | ICD-10-CM | POA: Diagnosis not present

## 2011-12-11 DIAGNOSIS — N401 Enlarged prostate with lower urinary tract symptoms: Secondary | ICD-10-CM | POA: Diagnosis not present

## 2012-04-15 DIAGNOSIS — H409 Unspecified glaucoma: Secondary | ICD-10-CM | POA: Diagnosis not present

## 2012-04-15 DIAGNOSIS — H4011X0 Primary open-angle glaucoma, stage unspecified: Secondary | ICD-10-CM | POA: Diagnosis not present

## 2012-04-21 DIAGNOSIS — E669 Obesity, unspecified: Secondary | ICD-10-CM | POA: Diagnosis not present

## 2012-04-21 DIAGNOSIS — G4733 Obstructive sleep apnea (adult) (pediatric): Secondary | ICD-10-CM | POA: Diagnosis not present

## 2012-05-09 DIAGNOSIS — R972 Elevated prostate specific antigen [PSA]: Secondary | ICD-10-CM | POA: Diagnosis not present

## 2012-05-12 DIAGNOSIS — N401 Enlarged prostate with lower urinary tract symptoms: Secondary | ICD-10-CM | POA: Diagnosis not present

## 2012-05-12 DIAGNOSIS — R972 Elevated prostate specific antigen [PSA]: Secondary | ICD-10-CM | POA: Diagnosis not present

## 2012-09-15 DIAGNOSIS — L570 Actinic keratosis: Secondary | ICD-10-CM | POA: Diagnosis not present

## 2012-09-15 DIAGNOSIS — L538 Other specified erythematous conditions: Secondary | ICD-10-CM | POA: Diagnosis not present

## 2012-10-06 DIAGNOSIS — Z125 Encounter for screening for malignant neoplasm of prostate: Secondary | ICD-10-CM | POA: Diagnosis not present

## 2012-10-06 DIAGNOSIS — Z0000 Encounter for general adult medical examination without abnormal findings: Secondary | ICD-10-CM | POA: Diagnosis not present

## 2012-10-06 DIAGNOSIS — K5909 Other constipation: Secondary | ICD-10-CM | POA: Diagnosis not present

## 2012-10-06 DIAGNOSIS — D751 Secondary polycythemia: Secondary | ICD-10-CM | POA: Diagnosis not present

## 2012-10-10 DIAGNOSIS — L259 Unspecified contact dermatitis, unspecified cause: Secondary | ICD-10-CM | POA: Diagnosis not present

## 2012-10-13 DIAGNOSIS — Z1212 Encounter for screening for malignant neoplasm of rectum: Secondary | ICD-10-CM | POA: Diagnosis not present

## 2012-10-13 DIAGNOSIS — Z1331 Encounter for screening for depression: Secondary | ICD-10-CM | POA: Diagnosis not present

## 2012-10-13 DIAGNOSIS — F329 Major depressive disorder, single episode, unspecified: Secondary | ICD-10-CM | POA: Diagnosis not present

## 2012-10-13 DIAGNOSIS — D751 Secondary polycythemia: Secondary | ICD-10-CM | POA: Diagnosis not present

## 2012-10-13 DIAGNOSIS — Z125 Encounter for screening for malignant neoplasm of prostate: Secondary | ICD-10-CM | POA: Diagnosis not present

## 2012-10-13 DIAGNOSIS — G4730 Sleep apnea, unspecified: Secondary | ICD-10-CM | POA: Diagnosis not present

## 2012-10-13 DIAGNOSIS — Z0000 Encounter for general adult medical examination without abnormal findings: Secondary | ICD-10-CM | POA: Diagnosis not present

## 2012-10-13 DIAGNOSIS — Z23 Encounter for immunization: Secondary | ICD-10-CM | POA: Diagnosis not present

## 2012-10-14 DIAGNOSIS — H409 Unspecified glaucoma: Secondary | ICD-10-CM | POA: Diagnosis not present

## 2012-10-14 DIAGNOSIS — H4011X0 Primary open-angle glaucoma, stage unspecified: Secondary | ICD-10-CM | POA: Diagnosis not present

## 2012-11-04 DIAGNOSIS — N401 Enlarged prostate with lower urinary tract symptoms: Secondary | ICD-10-CM | POA: Diagnosis not present

## 2012-11-11 DIAGNOSIS — R972 Elevated prostate specific antigen [PSA]: Secondary | ICD-10-CM | POA: Diagnosis not present

## 2012-11-11 DIAGNOSIS — N401 Enlarged prostate with lower urinary tract symptoms: Secondary | ICD-10-CM | POA: Diagnosis not present

## 2012-11-28 DIAGNOSIS — E669 Obesity, unspecified: Secondary | ICD-10-CM | POA: Diagnosis not present

## 2012-11-28 DIAGNOSIS — G4733 Obstructive sleep apnea (adult) (pediatric): Secondary | ICD-10-CM | POA: Diagnosis not present

## 2013-01-05 DIAGNOSIS — R109 Unspecified abdominal pain: Secondary | ICD-10-CM | POA: Diagnosis not present

## 2013-01-05 DIAGNOSIS — G4730 Sleep apnea, unspecified: Secondary | ICD-10-CM | POA: Diagnosis not present

## 2013-01-05 DIAGNOSIS — F17200 Nicotine dependence, unspecified, uncomplicated: Secondary | ICD-10-CM | POA: Diagnosis not present

## 2013-01-05 DIAGNOSIS — J0100 Acute maxillary sinusitis, unspecified: Secondary | ICD-10-CM | POA: Diagnosis not present

## 2013-04-15 DIAGNOSIS — H4011X0 Primary open-angle glaucoma, stage unspecified: Secondary | ICD-10-CM | POA: Diagnosis not present

## 2013-04-15 DIAGNOSIS — H409 Unspecified glaucoma: Secondary | ICD-10-CM | POA: Diagnosis not present

## 2013-05-13 DIAGNOSIS — L2089 Other atopic dermatitis: Secondary | ICD-10-CM | POA: Diagnosis not present

## 2013-05-13 DIAGNOSIS — L219 Seborrheic dermatitis, unspecified: Secondary | ICD-10-CM | POA: Diagnosis not present

## 2013-05-26 DIAGNOSIS — N401 Enlarged prostate with lower urinary tract symptoms: Secondary | ICD-10-CM | POA: Diagnosis not present

## 2013-05-28 DIAGNOSIS — K648 Other hemorrhoids: Secondary | ICD-10-CM | POA: Diagnosis not present

## 2013-05-28 DIAGNOSIS — K5730 Diverticulosis of large intestine without perforation or abscess without bleeding: Secondary | ICD-10-CM | POA: Diagnosis not present

## 2013-05-28 DIAGNOSIS — D126 Benign neoplasm of colon, unspecified: Secondary | ICD-10-CM | POA: Diagnosis not present

## 2013-05-28 DIAGNOSIS — Z86010 Personal history of colonic polyps: Secondary | ICD-10-CM | POA: Diagnosis not present

## 2013-06-03 DIAGNOSIS — D0460 Carcinoma in situ of skin of unspecified upper limb, including shoulder: Secondary | ICD-10-CM | POA: Diagnosis not present

## 2013-06-03 DIAGNOSIS — L988 Other specified disorders of the skin and subcutaneous tissue: Secondary | ICD-10-CM | POA: Diagnosis not present

## 2013-06-03 DIAGNOSIS — L821 Other seborrheic keratosis: Secondary | ICD-10-CM | POA: Diagnosis not present

## 2013-06-03 DIAGNOSIS — D485 Neoplasm of uncertain behavior of skin: Secondary | ICD-10-CM | POA: Diagnosis not present

## 2013-06-03 DIAGNOSIS — L570 Actinic keratosis: Secondary | ICD-10-CM | POA: Diagnosis not present

## 2013-06-03 DIAGNOSIS — L820 Inflamed seborrheic keratosis: Secondary | ICD-10-CM | POA: Diagnosis not present

## 2013-06-03 DIAGNOSIS — L2089 Other atopic dermatitis: Secondary | ICD-10-CM | POA: Diagnosis not present

## 2013-09-14 DIAGNOSIS — L2089 Other atopic dermatitis: Secondary | ICD-10-CM | POA: Diagnosis not present

## 2013-09-14 DIAGNOSIS — D0460 Carcinoma in situ of skin of unspecified upper limb, including shoulder: Secondary | ICD-10-CM | POA: Diagnosis not present

## 2013-09-14 DIAGNOSIS — L538 Other specified erythematous conditions: Secondary | ICD-10-CM | POA: Diagnosis not present

## 2013-09-14 DIAGNOSIS — D485 Neoplasm of uncertain behavior of skin: Secondary | ICD-10-CM | POA: Diagnosis not present

## 2013-09-14 DIAGNOSIS — C44721 Squamous cell carcinoma of skin of unspecified lower limb, including hip: Secondary | ICD-10-CM | POA: Diagnosis not present

## 2013-10-08 ENCOUNTER — Encounter: Payer: Self-pay | Admitting: Cardiology

## 2013-10-08 DIAGNOSIS — Z0000 Encounter for general adult medical examination without abnormal findings: Secondary | ICD-10-CM | POA: Diagnosis not present

## 2013-10-08 DIAGNOSIS — Z125 Encounter for screening for malignant neoplasm of prostate: Secondary | ICD-10-CM | POA: Diagnosis not present

## 2013-10-08 DIAGNOSIS — F329 Major depressive disorder, single episode, unspecified: Secondary | ICD-10-CM | POA: Diagnosis not present

## 2013-10-08 DIAGNOSIS — D751 Secondary polycythemia: Secondary | ICD-10-CM | POA: Diagnosis not present

## 2013-10-15 DIAGNOSIS — M1990 Unspecified osteoarthritis, unspecified site: Secondary | ICD-10-CM | POA: Diagnosis not present

## 2013-10-15 DIAGNOSIS — F329 Major depressive disorder, single episode, unspecified: Secondary | ICD-10-CM | POA: Diagnosis not present

## 2013-10-15 DIAGNOSIS — D751 Secondary polycythemia: Secondary | ICD-10-CM | POA: Diagnosis not present

## 2013-10-15 DIAGNOSIS — H409 Unspecified glaucoma: Secondary | ICD-10-CM | POA: Diagnosis not present

## 2013-10-15 DIAGNOSIS — F17200 Nicotine dependence, unspecified, uncomplicated: Secondary | ICD-10-CM | POA: Diagnosis not present

## 2013-10-15 DIAGNOSIS — J449 Chronic obstructive pulmonary disease, unspecified: Secondary | ICD-10-CM | POA: Diagnosis not present

## 2013-10-15 DIAGNOSIS — G4730 Sleep apnea, unspecified: Secondary | ICD-10-CM | POA: Diagnosis not present

## 2013-10-15 DIAGNOSIS — Z0000 Encounter for general adult medical examination without abnormal findings: Secondary | ICD-10-CM | POA: Diagnosis not present

## 2013-10-15 DIAGNOSIS — Z125 Encounter for screening for malignant neoplasm of prostate: Secondary | ICD-10-CM | POA: Diagnosis not present

## 2013-10-15 DIAGNOSIS — Z23 Encounter for immunization: Secondary | ICD-10-CM | POA: Diagnosis not present

## 2013-10-15 DIAGNOSIS — Z1331 Encounter for screening for depression: Secondary | ICD-10-CM | POA: Diagnosis not present

## 2013-10-16 ENCOUNTER — Ambulatory Visit (HOSPITAL_COMMUNITY)
Admission: RE | Admit: 2013-10-16 | Discharge: 2013-10-16 | Disposition: A | Discharge status: Home / Self Care | Payer: Medicare Other | Source: Ambulatory Visit | Attending: Vascular Surgery | Admitting: Vascular Surgery

## 2013-10-16 ENCOUNTER — Other Ambulatory Visit (HOSPITAL_COMMUNITY): Payer: Self-pay | Admitting: Internal Medicine

## 2013-10-16 DIAGNOSIS — Z1212 Encounter for screening for malignant neoplasm of rectum: Secondary | ICD-10-CM | POA: Diagnosis not present

## 2013-10-16 DIAGNOSIS — R55 Syncope and collapse: Secondary | ICD-10-CM

## 2013-10-21 DIAGNOSIS — C44721 Squamous cell carcinoma of skin of unspecified lower limb, including hip: Secondary | ICD-10-CM | POA: Diagnosis not present

## 2013-10-21 DIAGNOSIS — L538 Other specified erythematous conditions: Secondary | ICD-10-CM | POA: Diagnosis not present

## 2013-10-21 DIAGNOSIS — T148XXA Other injury of unspecified body region, initial encounter: Secondary | ICD-10-CM | POA: Diagnosis not present

## 2013-10-21 DIAGNOSIS — Z4802 Encounter for removal of sutures: Secondary | ICD-10-CM | POA: Diagnosis not present

## 2013-10-21 DIAGNOSIS — Z6827 Body mass index (BMI) 27.0-27.9, adult: Secondary | ICD-10-CM | POA: Diagnosis not present

## 2013-10-21 DIAGNOSIS — L2089 Other atopic dermatitis: Secondary | ICD-10-CM | POA: Diagnosis not present

## 2013-10-21 DIAGNOSIS — L905 Scar conditions and fibrosis of skin: Secondary | ICD-10-CM | POA: Diagnosis not present

## 2013-10-21 DIAGNOSIS — F17200 Nicotine dependence, unspecified, uncomplicated: Secondary | ICD-10-CM | POA: Diagnosis not present

## 2013-10-22 ENCOUNTER — Ambulatory Visit (INDEPENDENT_AMBULATORY_CARE_PROVIDER_SITE_OTHER): Payer: Medicare Other | Admitting: Cardiology

## 2013-10-22 ENCOUNTER — Encounter: Payer: Self-pay | Admitting: Cardiology

## 2013-10-22 VITALS — BP 148/87 | HR 72 | Ht 73.0 in | Wt 212.0 lb

## 2013-10-22 DIAGNOSIS — R079 Chest pain, unspecified: Secondary | ICD-10-CM | POA: Diagnosis not present

## 2013-10-22 DIAGNOSIS — R55 Syncope and collapse: Secondary | ICD-10-CM | POA: Diagnosis not present

## 2013-10-22 NOTE — Patient Instructions (Signed)
Your physician has requested that you have a lexiscan myoview. Please follow instruction sheet, as given.  Your physician recommends that you schedule a follow-up appointment in: WITH DR. Delton See AFTER LEXI  Your physician recommends that you continue on your current medications as directed. Please refer to the Current Medication list given to you today.

## 2013-10-22 NOTE — Progress Notes (Signed)
Patient ID: CHARLEY MISKE, male   DOB: December 03, 1933, 79 y.o.   MRN: [id number]     Patient Name: Wayne Walls Date of Encounter: 10/22/2013  Primary Care Provider:  Minda Meo, MD Primary Cardiologist:  Tobias Alexander, H  Problem List   No past medical history on file. No past surgical history on file.  Allergies  Allergies not on file  HPI  A very pleasant 79 year old gentleman who was referred to [place] after an episode of syncope. It happen while he was brushing his teeth and he was found by his wife, it is unknown for how long he was unconscious. He denies any preceding symptoms such as chest pain, SOB, palpitations. He is usually quite active, walking his two dogs daily for about a mile a day without any chest pain or SOB. Lately he went through a stressful time (moving, selling house) and felt more fatigued than usual. He admits to drinking 3 alcohol beverages every evening. He has no prior cardiac history, he was never diagnosed with hypertension and states that his blood pressure at home it's usually 120s, however at the last PCP visit his blood pressure was 140. He has been on and off smoking throughout his life currently smoking one pack a day. He denies any claudications.    Home Medications  Prior to Admission medications   Not on File   Family History  No family history on file.  Social History  History   Social History  . Marital Status: Married    Spouse Name: N/A    Number of Children: N/A  . Years of Education: N/A   Occupational History  . Not on file.   Social History Main Topics  . Smoking status: Current Every Day Smoker  . Smokeless tobacco: Not on file  . Alcohol Use: Not on file  . Drug Use: Not on file  . Sexual Activity: Not on file   Other Topics Concern  . Not on file   Social History Narrative  . No narrative on file     Review of Systems, as per HPI, otherwise negative General:  No chills, fever, night sweats or weight  changes.  Cardiovascular:  No chest pain, dyspnea on exertion, edema, orthopnea, palpitations, paroxysmal nocturnal dyspnea. Dermatological: No rash, lesions/masses Respiratory: No cough, dyspnea Urologic: No hematuria, dysuria Abdominal:   No nausea, vomiting, diarrhea, bright red blood per rectum, melena, or hematemesis Neurologic:  No visual changes, wkns, changes in mental status. All other systems reviewed and are otherwise negative except as noted above.  Physical Exam  Height 6\' 1"  (1.854 m), weight 212 lb (96.163 kg).  General: Pleasant, NAD Psych: Normal affect. Neuro: Alert and oriented X 3. Moves all extremities spontaneously. HEENT: Normal  Neck: Supple without bruits or JVD. Lungs:  Resp regular and unlabored, CTA. Heart: RRR no s3, s4, or murmurs. Abdomen: Soft, non-tender, non-distended, BS + x 4.  Extremities: No clubbing, cyanosis or edema. DP/PT/Radials 2+ and equal bilaterally.  Labs: From his PCP  Crea 0.8 AST 18 ALT 18 TAG 79 HDL 46 LDL 96  Accessory Clinical Findings  echocardiogram  ECG - sinus rhythm, 72 beats per minute, right bundle branch block and left anterior fascicular block, bifascicular block, abnormal EKG.    Assessment & Plan  Every pleasant 79 year old male  1. Syncope, abnormal ECG, no prior cardiac work up, we will order an exercise nuclear stress test. His risk factors include heavy smoking.  2. Hypertension - controlled  at home, we will reevaluate at the next visit.  3. Lipid profile - WNL  4. Smoking cessation - the patient asked about electronic cigarettes, that ar not ideal substitute, but better than cigarettes that contain smoke.    Follow up in 3 weeks.    Lars Masson, MD, [facility] 10/22/2013, 12:15 PM

## 2013-11-02 DIAGNOSIS — Z4802 Encounter for removal of sutures: Secondary | ICD-10-CM | POA: Diagnosis not present

## 2013-11-11 ENCOUNTER — Ambulatory Visit (HOSPITAL_COMMUNITY): Payer: Medicare Other | Attending: Cardiology | Admitting: Radiology

## 2013-11-11 ENCOUNTER — Encounter: Payer: Self-pay | Admitting: Cardiology

## 2013-11-11 VITALS — BP 127/75 | HR 56 | Ht 73.0 in | Wt 212.0 lb

## 2013-11-11 DIAGNOSIS — R55 Syncope and collapse: Secondary | ICD-10-CM | POA: Insufficient documentation

## 2013-11-11 DIAGNOSIS — R9431 Abnormal electrocardiogram [ECG] [EKG]: Secondary | ICD-10-CM | POA: Diagnosis not present

## 2013-11-11 DIAGNOSIS — F17200 Nicotine dependence, unspecified, uncomplicated: Secondary | ICD-10-CM | POA: Diagnosis not present

## 2013-11-11 DIAGNOSIS — R5381 Other malaise: Secondary | ICD-10-CM | POA: Diagnosis not present

## 2013-11-11 DIAGNOSIS — Z8249 Family history of ischemic heart disease and other diseases of the circulatory system: Secondary | ICD-10-CM | POA: Insufficient documentation

## 2013-11-11 DIAGNOSIS — I779 Disorder of arteries and arterioles, unspecified: Secondary | ICD-10-CM | POA: Diagnosis not present

## 2013-11-11 DIAGNOSIS — R079 Chest pain, unspecified: Secondary | ICD-10-CM

## 2013-11-11 MED ORDER — TECHNETIUM TC 99M SESTAMIBI GENERIC - CARDIOLITE
33.0000 | Freq: Once | INTRAVENOUS | Status: AC | PRN
  Administered 2013-11-11: 33 via INTRAVENOUS

## 2013-11-11 MED ORDER — TECHNETIUM TC 99M SESTAMIBI GENERIC - CARDIOLITE
11.0000 | Freq: Once | INTRAVENOUS | Status: AC | PRN
  Administered 2013-11-11: 11 via INTRAVENOUS

## 2013-11-11 MED ORDER — REGADENOSON 0.4 MG/5ML IV SOLN
0.4000 mg | Freq: Once | INTRAVENOUS | Status: AC
  Administered 2013-11-11: 0.4 mg via INTRAVENOUS

## 2013-11-11 NOTE — Progress Notes (Signed)
[facility] SITE 3 NUCLEAR MED [address] [place], [place] [address] [phone]    Cardiology Nuclear Med Study  Wayne Walls is a 80 y.o. male     MRN : [id number]     DOB: 1933-09-14  Procedure Date: 11/11/2013  Nuclear Med Background Indication for Stress Test:  Evaluation for Ischemia, Abnormal EKG and Syncope episode 3 to 4 weeks ago History:  No known CAD Cardiac Risk Factors: Carotid Disease, Family History - CAD and Smoker  Symptoms:  Fatigue and Syncope   Nuclear Pre-Procedure Caffeine/Decaff Intake:  None >12 hrs NPO After: 7:30am   Lungs:  clear O2 Sat: 94% on room air. IV 0.9% NS with Angio Cath:  20g  IV Site: R Antecubital x 1, tolerated well IV Started by:  Irean Hong, RN  Chest Size (in):  46 Cup Size: n/a  Height: 6\' 1"  (1.854 m)  Weight:  212 lb (96.163 kg)  BMI:  Body mass index is 27.98 kg/(m^2). Tech Comments:  No medications this am per patient    Nuclear Med Study 1 or 2 day study: 1 day  Stress Test Type:  Treadmill/Lexiscan  Reading MD: Kristeen Miss, MD  Order Authorizing Provider:  Tobias Alexander, MD  Resting Radionuclide: Technetium 63m Sestamibi  Resting Radionuclide Dose: 11.0 mCi   Stress Radionuclide:  Technetium 6m Sestamibi  Stress Radionuclide Dose: 33.0 mCi           Stress Protocol Rest HR: 56 Stress HR: 100  Rest BP: 127/75 Stress BP: 152/78  Exercise Time (min): n/a METS: n/a           Dose of Adenosine (mg):  n/a Dose of Lexiscan: 0.4 mg  Dose of Atropine (mg): n/a Dose of Dobutamine: n/a mcg/kg/min (at max HR)  Stress Test Technologist: Nelson Chimes, BS-ES  Nuclear Technologist:  Harlow Asa, CNMT     Rest Procedure:  Myocardial perfusion imaging was performed at rest 45 minutes following the intravenous administration of Technetium 18m Sestamibi. Rest ECG: NSR-RBBB  Stress Procedure:  The patient received IV Lexiscan 0.4 mg over 15-seconds with concurrent low level exercise and then  Technetium 66m Sestamibi was injected at 30-seconds while the patient continued walking one more minute.  Quantitative spect images were obtained after a 45-minute delay. Stress ECG: No significant change from baseline ECG  QPS Raw Data Images:  Normal; no motion artifact; normal heart/lung ratio. Stress Images:  There is a small mild defect in the basal lateral region with fairly normal uptake in the other regions.   Rest Images:  There is a small mild defect in the basal lateral region with fairly normal uptake in the other regions.  Subtraction (SDS):  No evidence of ischemia.  The basal lateral attenuation is fixed.  Transient Ischemic Dilatation (Normal <1.22):  1.06 Lung/Heart Ratio (Normal <0.45):  0.35  Quantitative Gated Spect Images QGS EDV:  140 ml QGS ESV:  56 ml  Impression Exercise Capacity:  Lexiscan with low level exercise. BP Response:  Normal blood pressure response. Clinical Symptoms:  No significant symptoms noted. ECG Impression:  No significant ST segment change suggestive of ischemia. Comparison with Prior Nuclear Study: No images to compare  Overall Impression:  Low risk stress nuclear study   There is a small, fixed defect in the basal lateral region. SSS=4,   There is no ischemia.  The basal lateral region contracts well but  I cannot rule out a previous subendocardial MI. .  LV Ejection Fraction:  60%.  LV Wall Motion:  NL LV Function; NL Wall Motion.   [name], [name]., MD, [facility] 11/11/2013, 4:24 PM Office - [phone] Pager [phone]

## 2013-11-12 DIAGNOSIS — N401 Enlarged prostate with lower urinary tract symptoms: Secondary | ICD-10-CM | POA: Diagnosis not present

## 2013-11-12 DIAGNOSIS — N139 Obstructive and reflux uropathy, unspecified: Secondary | ICD-10-CM | POA: Diagnosis not present

## 2013-11-12 DIAGNOSIS — R972 Elevated prostate specific antigen [PSA]: Secondary | ICD-10-CM | POA: Diagnosis not present

## 2013-11-16 DIAGNOSIS — T148XXA Other injury of unspecified body region, initial encounter: Secondary | ICD-10-CM | POA: Diagnosis not present

## 2013-11-16 DIAGNOSIS — Z6828 Body mass index (BMI) 28.0-28.9, adult: Secondary | ICD-10-CM | POA: Diagnosis not present

## 2013-11-17 ENCOUNTER — Ambulatory Visit (INDEPENDENT_AMBULATORY_CARE_PROVIDER_SITE_OTHER): Payer: Medicare Other | Admitting: Cardiology

## 2013-11-17 ENCOUNTER — Encounter: Payer: Self-pay | Admitting: Cardiology

## 2013-11-17 ENCOUNTER — Ambulatory Visit: Payer: Medicare Other | Admitting: Cardiology

## 2013-11-17 VITALS — BP 102/76 | HR 70 | Ht 72.0 in | Wt 212.0 lb

## 2013-11-17 DIAGNOSIS — R55 Syncope and collapse: Secondary | ICD-10-CM

## 2013-11-17 DIAGNOSIS — H409 Unspecified glaucoma: Secondary | ICD-10-CM | POA: Diagnosis not present

## 2013-11-17 DIAGNOSIS — H4011X0 Primary open-angle glaucoma, stage unspecified: Secondary | ICD-10-CM | POA: Diagnosis not present

## 2013-11-17 DIAGNOSIS — R079 Chest pain, unspecified: Secondary | ICD-10-CM | POA: Diagnosis not present

## 2013-11-17 NOTE — Progress Notes (Signed)
Patient ID: AVISHAI REIHL, male   DOB: 04-Apr-1933, 79 y.o.   MRN: [id number]     Patient Name: OSIEL STICK Date of Encounter: 11/17/2013  Primary Care Provider:  Minda Meo, MD Primary Cardiologist:  Tobias Alexander, H  Problem List   Past Medical History  Diagnosis Date  . Unspecified sleep apnea     Chronic  . Tobacco use   . Depression   . Osteoarthrosis, unspecified whether generalized or localized, unspecified site   . Unspecified glaucoma(365.9)   . COPD (chronic obstructive pulmonary disease)   . Syncope     Carotids, cardiology   No past surgical history on file.  Allergies  No Known Allergies  HPI  A very pleasant 79 year old gentleman who was referred to [place] after an episode of syncope. It happen while he was brushing his teeth and he was found by his wife, it is unknown for how long he was unconscious. He denies any preceding symptoms such as chest pain, SOB, palpitations. He is usually quite active, walking his two dogs daily for about a mile a day without any chest pain or SOB. Lately he went through a stressful time (moving, selling house) and felt more fatigued than usual. He admits to drinking 3 alcohol beverages every evening. He has no prior cardiac history, he was never diagnosed with hypertension and states that his blood pressure at home it's usually 120s, however at the last PCP visit his blood pressure was 140. He has been on and off smoking throughout his life currently smoking one pack a day. He denies any claudications.   Follow up after 1 months, the patient is asymptomatic, no more syncopal episodes.  Home Medications  Prior to Admission medications   Not on File   Family History  No family history on file.  Social History  History   Social History  . Marital Status: Married    Spouse Name: N/A    Number of Children: N/A  . Years of Education: N/A   Occupational History  . Not on file.   Social History Main Topics  .  Smoking status: Current Every Day Smoker  . Smokeless tobacco: Not on file  . Alcohol Use: Not on file  . Drug Use: Not on file  . Sexual Activity: Not on file   Other Topics Concern  . Not on file   Social History Narrative  . No narrative on file     Review of Systems, as per HPI, otherwise negative General:  No chills, fever, night sweats or weight changes.  Cardiovascular:  No chest pain, dyspnea on exertion, edema, orthopnea, palpitations, paroxysmal nocturnal dyspnea. Dermatological: No rash, lesions/masses Respiratory: No cough, dyspnea Urologic: No hematuria, dysuria Abdominal:   No nausea, vomiting, diarrhea, bright red blood per rectum, melena, or hematemesis Neurologic:  No visual changes, wkns, changes in mental status. All other systems reviewed and are otherwise negative except as noted above.  Physical Exam  Blood pressure 102/76, pulse 70, height 6' (1.829 m), weight 212 lb (96.163 kg), SpO2 92.00%.  General: Pleasant, NAD Psych: Normal affect. Neuro: Alert and oriented X 3. Moves all extremities spontaneously. HEENT: Normal  Neck: Supple without bruits or JVD. Lungs:  Resp regular and unlabored, CTA. Heart: RRR no s3, s4, or murmurs. Abdomen: Soft, non-tender, non-distended, BS + x 4.  Extremities: No clubbing, cyanosis or edema. DP/PT/Radials 2+ and equal bilaterally.  Labs: From his PCP  Crea 0.8 AST 18 ALT 18 TAG 79  HDL 46 LDL 96  Accessory Clinical Findings  echocardiogram  ECG - sinus rhythm, 72 beats per minute, right bundle branch block and left anterior fascicular block, bifascicular block, abnormal EKG.  Lexiscan nuclear stress test 11/12/13 Impression  Exercise Capacity: Lexiscan with low level exercise.  BP Response: Normal blood pressure response.  Clinical Symptoms: No significant symptoms noted.  ECG Impression: No significant ST segment change suggestive of ischemia.  Comparison with Prior Nuclear Study: No images to compare    Overall Impression: Low risk stress nuclear study There is a small, fixed defect in the basal lateral region. SSS=4, There is no ischemia. The basal lateral region contracts well but I cannot rule out a previous subendocardial MI. .  LV Ejection Fraction: 60%. LV Wall Motion: NL LV Function; NL Wall Motion.   Assessment & Plan  Every pleasant 79 year old male  1. Syncope, abnormal ECG, no prior cardiac work up, we will order an exercise nuclear stress test. His risk factors include heavy smoking. Hi stress test was negative for ischemia, normal LVEF.  2. Hypertension - controlled   3. Lipid profile - WNL  4. Smoking cessation - the patient asked about electronic cigarettes, that ar not ideal substitute, but better than cigarettes that contain smoke.   5. OSA - on CPAP, followed by Armanda Magic  Follow up in 1 year with lipid profile.    Tobias Alexander, Rexene Edison, MD, [facility] 11/17/2013, 9:12 AM

## 2013-11-17 NOTE — Patient Instructions (Addendum)
Your physician wants you to follow-up in: 1 year with fasting lab work ( lipid panel,cmet ). You will receive a reminder letter in the mail two months in advance. If you don't receive a letter, please call our office to schedule the follow-up appointment.

## 2014-02-09 DIAGNOSIS — Z85828 Personal history of other malignant neoplasm of skin: Secondary | ICD-10-CM | POA: Diagnosis not present

## 2014-02-09 DIAGNOSIS — L570 Actinic keratosis: Secondary | ICD-10-CM | POA: Diagnosis not present

## 2014-02-09 DIAGNOSIS — L821 Other seborrheic keratosis: Secondary | ICD-10-CM | POA: Diagnosis not present

## 2014-02-09 DIAGNOSIS — L723 Sebaceous cyst: Secondary | ICD-10-CM | POA: Diagnosis not present

## 2014-02-09 DIAGNOSIS — Q828 Other specified congenital malformations of skin: Secondary | ICD-10-CM | POA: Diagnosis not present

## 2014-02-09 DIAGNOSIS — L538 Other specified erythematous conditions: Secondary | ICD-10-CM | POA: Diagnosis not present

## 2014-02-10 DIAGNOSIS — K5900 Constipation, unspecified: Secondary | ICD-10-CM | POA: Diagnosis not present

## 2014-04-29 DIAGNOSIS — R079 Chest pain, unspecified: Secondary | ICD-10-CM | POA: Diagnosis not present

## 2014-05-26 DIAGNOSIS — H409 Unspecified glaucoma: Secondary | ICD-10-CM | POA: Diagnosis not present

## 2014-05-26 DIAGNOSIS — H4011X0 Primary open-angle glaucoma, stage unspecified: Secondary | ICD-10-CM | POA: Diagnosis not present

## 2014-06-25 DIAGNOSIS — Z85828 Personal history of other malignant neoplasm of skin: Secondary | ICD-10-CM | POA: Diagnosis not present

## 2014-06-25 DIAGNOSIS — L988 Other specified disorders of the skin and subcutaneous tissue: Secondary | ICD-10-CM | POA: Diagnosis not present

## 2014-06-25 DIAGNOSIS — L538 Other specified erythematous conditions: Secondary | ICD-10-CM | POA: Diagnosis not present

## 2014-06-25 DIAGNOSIS — L905 Scar conditions and fibrosis of skin: Secondary | ICD-10-CM | POA: Diagnosis not present

## 2014-10-07 DIAGNOSIS — L304 Erythema intertrigo: Secondary | ICD-10-CM | POA: Diagnosis not present

## 2014-10-07 DIAGNOSIS — L821 Other seborrheic keratosis: Secondary | ICD-10-CM | POA: Diagnosis not present

## 2014-10-07 DIAGNOSIS — Z85828 Personal history of other malignant neoplasm of skin: Secondary | ICD-10-CM | POA: Diagnosis not present

## 2014-10-07 DIAGNOSIS — D1801 Hemangioma of skin and subcutaneous tissue: Secondary | ICD-10-CM | POA: Diagnosis not present

## 2014-10-07 DIAGNOSIS — Q828 Other specified congenital malformations of skin: Secondary | ICD-10-CM | POA: Diagnosis not present

## 2014-10-07 DIAGNOSIS — L111 Transient acantholytic dermatosis [Grover]: Secondary | ICD-10-CM | POA: Diagnosis not present

## 2014-10-07 DIAGNOSIS — L570 Actinic keratosis: Secondary | ICD-10-CM | POA: Diagnosis not present

## 2014-10-13 DIAGNOSIS — F329 Major depressive disorder, single episode, unspecified: Secondary | ICD-10-CM | POA: Diagnosis not present

## 2014-10-13 DIAGNOSIS — Z0000 Encounter for general adult medical examination without abnormal findings: Secondary | ICD-10-CM | POA: Diagnosis not present

## 2014-10-13 DIAGNOSIS — D751 Secondary polycythemia: Secondary | ICD-10-CM | POA: Diagnosis not present

## 2014-10-13 DIAGNOSIS — K5909 Other constipation: Secondary | ICD-10-CM | POA: Diagnosis not present

## 2014-10-13 DIAGNOSIS — Z125 Encounter for screening for malignant neoplasm of prostate: Secondary | ICD-10-CM | POA: Diagnosis not present

## 2014-10-18 DIAGNOSIS — Z1212 Encounter for screening for malignant neoplasm of rectum: Secondary | ICD-10-CM | POA: Diagnosis not present

## 2014-10-21 DIAGNOSIS — Z720 Tobacco use: Secondary | ICD-10-CM | POA: Diagnosis not present

## 2014-10-21 DIAGNOSIS — F329 Major depressive disorder, single episode, unspecified: Secondary | ICD-10-CM | POA: Diagnosis not present

## 2014-10-21 DIAGNOSIS — G4739 Other sleep apnea: Secondary | ICD-10-CM | POA: Diagnosis not present

## 2014-10-21 DIAGNOSIS — Z23 Encounter for immunization: Secondary | ICD-10-CM | POA: Diagnosis not present

## 2014-10-21 DIAGNOSIS — Z0000 Encounter for general adult medical examination without abnormal findings: Secondary | ICD-10-CM | POA: Diagnosis not present

## 2014-10-21 DIAGNOSIS — J449 Chronic obstructive pulmonary disease, unspecified: Secondary | ICD-10-CM | POA: Diagnosis not present

## 2014-10-21 DIAGNOSIS — Z1389 Encounter for screening for other disorder: Secondary | ICD-10-CM | POA: Diagnosis not present

## 2014-10-21 DIAGNOSIS — Z6828 Body mass index (BMI) 28.0-28.9, adult: Secondary | ICD-10-CM | POA: Diagnosis not present

## 2014-10-21 DIAGNOSIS — D751 Secondary polycythemia: Secondary | ICD-10-CM | POA: Diagnosis not present

## 2014-10-21 DIAGNOSIS — M1990 Unspecified osteoarthritis, unspecified site: Secondary | ICD-10-CM | POA: Diagnosis not present

## 2014-12-08 DIAGNOSIS — R05 Cough: Secondary | ICD-10-CM | POA: Diagnosis not present

## 2014-12-08 DIAGNOSIS — Z6827 Body mass index (BMI) 27.0-27.9, adult: Secondary | ICD-10-CM | POA: Diagnosis not present

## 2014-12-08 DIAGNOSIS — J069 Acute upper respiratory infection, unspecified: Secondary | ICD-10-CM | POA: Diagnosis not present

## 2015-02-15 DIAGNOSIS — C44629 Squamous cell carcinoma of skin of left upper limb, including shoulder: Secondary | ICD-10-CM | POA: Diagnosis not present

## 2015-02-15 DIAGNOSIS — Z85828 Personal history of other malignant neoplasm of skin: Secondary | ICD-10-CM | POA: Diagnosis not present

## 2015-03-24 DIAGNOSIS — H4011X1 Primary open-angle glaucoma, mild stage: Secondary | ICD-10-CM | POA: Diagnosis not present

## 2015-06-16 ENCOUNTER — Telehealth: Payer: Self-pay | Admitting: Cardiology

## 2015-06-16 DIAGNOSIS — G4733 Obstructive sleep apnea (adult) (pediatric): Secondary | ICD-10-CM

## 2015-06-16 NOTE — Telephone Encounter (Signed)
Patient has a follow up appointment in August with Turner.  He is in need of supplies, [facility] said they would help him out if I would send in an order for him to get new supplies until he sees turner.  I have submitted the order to [facility], and sent messages to the appropriate people to let them know the order was in.

## 2015-06-16 NOTE — Telephone Encounter (Signed)
New Message  Pt calling to speak w/ Romelle Starcher, Pt stated [facility] needs paperwork from Dr. Radford Pax concerning CPAP supplies. Please call back and discuss.

## 2015-07-13 ENCOUNTER — Ambulatory Visit (INDEPENDENT_AMBULATORY_CARE_PROVIDER_SITE_OTHER): Payer: Medicare Other | Admitting: Cardiology

## 2015-07-13 ENCOUNTER — Encounter: Payer: Self-pay | Admitting: Cardiology

## 2015-07-13 VITALS — BP 110/58 | HR 87 | Ht 72.0 in | Wt 215.8 lb

## 2015-07-13 DIAGNOSIS — G4733 Obstructive sleep apnea (adult) (pediatric): Secondary | ICD-10-CM | POA: Insufficient documentation

## 2015-07-13 NOTE — Patient Instructions (Signed)
Medication Instructions:  Your physician recommends that you continue on your current medications as directed. Please refer to the Current Medication list given to you today.   Labwork: None  Testing/Procedures: None  Follow-Up: Your physician wants you to follow-up in: 1 year with Dr. Radford Pax. You will receive a reminder letter in the mail two months in advance. If you don't receive a letter, please call our office to schedule the follow-up appointment.   Any Other Special Instructions Will Be Listed Below (If Applicable). [facility] will be in touch with you soon for supplies.

## 2015-07-13 NOTE — Progress Notes (Signed)
Cardiology Office Note   Date:  07/13/2015   ID:  Wayne Walls, DOB 1933/08/18, MRN [id number]  PCP:  Geoffery Lyons, MD    Chief Complaint  Patient presents with  . Follow-up    OSA      History of Present Illness: Wayne Walls is a 81 y.o. male who presents for followup of OSA.  He was followed with me in the past but has not seen me in several years.  He is doing well with his CPAP therapy.  He tolerates his device well.  He tolerates the nasal mask and feels the pressure is adequate.  He feels rested in the am and has no daytime sleepiness.He has no dry mouth or nasal congestion.      Past Medical History  Diagnosis Date  . Unspecified sleep apnea     Chronic  . Tobacco use   . Depression   . Osteoarthrosis, unspecified whether generalized or localized, unspecified site   . Unspecified glaucoma   . COPD (chronic obstructive pulmonary disease)   . Syncope     Carotids, cardiology    History reviewed. No pertinent past surgical history.   Current Outpatient Prescriptions  Medication Sig Dispense Refill  . aspirin 81 MG tablet Take 81 mg by mouth daily.    . cholecalciferol (VITAMIN D) 1000 UNITS tablet Take 1,000 Units by mouth daily.    . clotrimazole-betamethasone (LOTRISONE) cream As directed    . fluocinonide cream (LIDEX) 0.05 % As directed    . latanoprost (XALATAN) 0.005 % ophthalmic solution As directed    . escitalopram (LEXAPRO) 20 MG tablet Take 1 tablet by mouth daily.    . traZODone (DESYREL) 50 MG tablet Take 1 tablet by mouth daily.     No current facility-administered medications for this visit.    Allergies:   Review of patient's allergies indicates no known allergies.    Social History:  The patient  reports that he has been smoking.  He does not have any smokeless tobacco history on file.   Family History:  The patient's family history is not on file.    ROS:  Please see the history of present illness.    Otherwise, review of systems are positive for none.   All other systems are reviewed and negative.    PHYSICAL EXAM: VS:  BP 110/58 mmHg  Pulse 87  Ht 6' (1.829 m)  Wt 215 lb 12.8 oz (97.886 kg)  BMI 29.26 kg/m2  SpO2 92% , BMI Body mass index is 29.26 kg/(m^2). GEN: Well nourished, well developed, in no acute distress HEENT: normal Neck: no JVD, carotid bruits, or masses Cardiac: RRR; no murmurs, rubs, or gallops,no edema  Respiratory:  clear to auscultation bilaterally, normal work of breathing GI: soft, nontender, nondistended, + BS MS: no deformity or atrophy Skin: warm and dry, no rash Neuro:  Strength and sensation are intact Psych: euthymic mood, full affect   EKG:  EKG is not ordered today.    Recent Labs: No results found for requested labs within last 365 days.    Lipid Panel No results found for: CHOL, TRIG, HDL, CHOLHDL, VLDL, LDLCALC, LDLDIRECT    Wt Readings from Last 3 Encounters:  07/13/15 215 lb 12.8 oz (97.886 kg)  11/17/13 212 lb (96.163 kg)  11/11/13 212 lb (96.163 kg)      ASSESSMENT AND PLAN:  1.  OSA  on CPAP and tolerating well.  His d/l today showed an AHI of 3.9/hr on 11cm H2O and 100% compliance in using more than 4 hours nightly. He will continue on current settings.     Current medicines are reviewed at length with the patient today.  The patient does not have concerns regarding medicines.  The following changes have been made:  no change  Labs/ tests ordered today: See above Assessment and Plan No orders of the defined types were placed in this encounter.     Disposition:   FU with me in 1 year  Signed, Sueanne Margarita, MD  07/13/2015 2:53 PM    [facility] [place], [place], [place]  [address] Phone: [phone]; Fax: [phone]

## 2015-07-20 ENCOUNTER — Encounter: Payer: Self-pay | Admitting: Cardiology

## 2015-07-22 ENCOUNTER — Telehealth: Payer: Self-pay | Admitting: Cardiology

## 2015-07-22 NOTE — Telephone Encounter (Signed)
Pt called to let Dr. Radford Pax know that no one from  [facility] has  call him for the CPAP supplies. Pt though that they would called him by this past Wednesday.

## 2015-07-22 NOTE — Telephone Encounter (Signed)
New message     Pt calling in regards to CPAP machine  Pt was with [employer] and pt states being switched to a new company Pt checking on status of switch to new company Please call to discuss

## 2015-07-26 NOTE — Telephone Encounter (Signed)
Message sent back to [facility] to have them contact patient.   Patient is aware

## 2015-07-29 ENCOUNTER — Telehealth: Payer: Self-pay | Admitting: Cardiology

## 2015-07-29 NOTE — Telephone Encounter (Signed)
Spoke with Jonni Sanger [facility]), they are processing the paperwork and will contact the patient by Tuesday or Wednesday for him to be able to pick up supplies. Patient informed.

## 2015-07-29 NOTE — Telephone Encounter (Signed)
New Message  Pt following up on new CPAP distributor previously discussed w/ Dr Radford Pax. Please call back and discuss.

## 2015-09-22 DIAGNOSIS — H2513 Age-related nuclear cataract, bilateral: Secondary | ICD-10-CM | POA: Diagnosis not present

## 2015-09-22 DIAGNOSIS — H401131 Primary open-angle glaucoma, bilateral, mild stage: Secondary | ICD-10-CM | POA: Diagnosis not present

## 2015-10-12 DIAGNOSIS — L304 Erythema intertrigo: Secondary | ICD-10-CM | POA: Diagnosis not present

## 2015-10-12 DIAGNOSIS — L821 Other seborrheic keratosis: Secondary | ICD-10-CM | POA: Diagnosis not present

## 2015-10-12 DIAGNOSIS — D225 Melanocytic nevi of trunk: Secondary | ICD-10-CM | POA: Diagnosis not present

## 2015-10-12 DIAGNOSIS — L814 Other melanin hyperpigmentation: Secondary | ICD-10-CM | POA: Diagnosis not present

## 2015-10-12 DIAGNOSIS — L570 Actinic keratosis: Secondary | ICD-10-CM | POA: Diagnosis not present

## 2015-10-12 DIAGNOSIS — Z85828 Personal history of other malignant neoplasm of skin: Secondary | ICD-10-CM | POA: Diagnosis not present

## 2015-10-12 DIAGNOSIS — D1801 Hemangioma of skin and subcutaneous tissue: Secondary | ICD-10-CM | POA: Diagnosis not present

## 2015-10-17 DIAGNOSIS — Z125 Encounter for screening for malignant neoplasm of prostate: Secondary | ICD-10-CM | POA: Diagnosis not present

## 2015-10-17 DIAGNOSIS — K5909 Other constipation: Secondary | ICD-10-CM | POA: Diagnosis not present

## 2015-10-17 DIAGNOSIS — Z79899 Other long term (current) drug therapy: Secondary | ICD-10-CM | POA: Diagnosis not present

## 2015-10-24 DIAGNOSIS — Z720 Tobacco use: Secondary | ICD-10-CM | POA: Diagnosis not present

## 2015-10-24 DIAGNOSIS — D751 Secondary polycythemia: Secondary | ICD-10-CM | POA: Diagnosis not present

## 2015-10-24 DIAGNOSIS — F329 Major depressive disorder, single episode, unspecified: Secondary | ICD-10-CM | POA: Diagnosis not present

## 2015-10-24 DIAGNOSIS — Z6828 Body mass index (BMI) 28.0-28.9, adult: Secondary | ICD-10-CM | POA: Diagnosis not present

## 2015-10-24 DIAGNOSIS — Z79899 Other long term (current) drug therapy: Secondary | ICD-10-CM | POA: Diagnosis not present

## 2015-10-24 DIAGNOSIS — N401 Enlarged prostate with lower urinary tract symptoms: Secondary | ICD-10-CM | POA: Diagnosis not present

## 2015-10-24 DIAGNOSIS — J449 Chronic obstructive pulmonary disease, unspecified: Secondary | ICD-10-CM | POA: Diagnosis not present

## 2015-10-24 DIAGNOSIS — Z0000 Encounter for general adult medical examination without abnormal findings: Secondary | ICD-10-CM | POA: Diagnosis not present

## 2015-10-24 DIAGNOSIS — Z23 Encounter for immunization: Secondary | ICD-10-CM | POA: Diagnosis not present

## 2015-10-24 DIAGNOSIS — R972 Elevated prostate specific antigen [PSA]: Secondary | ICD-10-CM | POA: Diagnosis not present

## 2015-10-24 DIAGNOSIS — Z1389 Encounter for screening for other disorder: Secondary | ICD-10-CM | POA: Diagnosis not present

## 2015-10-24 DIAGNOSIS — G4739 Other sleep apnea: Secondary | ICD-10-CM | POA: Diagnosis not present

## 2016-03-23 DIAGNOSIS — H04123 Dry eye syndrome of bilateral lacrimal glands: Secondary | ICD-10-CM | POA: Diagnosis not present

## 2016-05-02 DIAGNOSIS — F329 Major depressive disorder, single episode, unspecified: Secondary | ICD-10-CM | POA: Diagnosis not present

## 2016-05-02 DIAGNOSIS — M1990 Unspecified osteoarthritis, unspecified site: Secondary | ICD-10-CM | POA: Diagnosis not present

## 2016-05-02 DIAGNOSIS — J449 Chronic obstructive pulmonary disease, unspecified: Secondary | ICD-10-CM | POA: Diagnosis not present

## 2016-05-02 DIAGNOSIS — Z720 Tobacco use: Secondary | ICD-10-CM | POA: Diagnosis not present

## 2016-05-02 DIAGNOSIS — D751 Secondary polycythemia: Secondary | ICD-10-CM | POA: Diagnosis not present

## 2016-05-02 DIAGNOSIS — R972 Elevated prostate specific antigen [PSA]: Secondary | ICD-10-CM | POA: Diagnosis not present

## 2016-05-02 DIAGNOSIS — N401 Enlarged prostate with lower urinary tract symptoms: Secondary | ICD-10-CM | POA: Diagnosis not present

## 2016-05-02 DIAGNOSIS — Z6827 Body mass index (BMI) 27.0-27.9, adult: Secondary | ICD-10-CM | POA: Diagnosis not present

## 2016-05-02 DIAGNOSIS — G4739 Other sleep apnea: Secondary | ICD-10-CM | POA: Diagnosis not present

## 2016-05-02 DIAGNOSIS — Z1389 Encounter for screening for other disorder: Secondary | ICD-10-CM | POA: Diagnosis not present

## 2016-05-17 DIAGNOSIS — H401111 Primary open-angle glaucoma, right eye, mild stage: Secondary | ICD-10-CM | POA: Diagnosis not present

## 2016-05-17 DIAGNOSIS — H401122 Primary open-angle glaucoma, left eye, moderate stage: Secondary | ICD-10-CM | POA: Diagnosis not present

## 2016-06-08 ENCOUNTER — Encounter (HOSPITAL_COMMUNITY): Payer: Self-pay | Admitting: Emergency Medicine

## 2016-06-08 ENCOUNTER — Emergency Department (HOSPITAL_COMMUNITY): Payer: Medicare Other

## 2016-06-08 ENCOUNTER — Emergency Department (HOSPITAL_COMMUNITY)
Admission: EM | Admit: 2016-06-08 | Discharge: 2016-06-08 | Disposition: A | Discharge status: Home / Self Care | Payer: Medicare Other | Attending: Emergency Medicine | Admitting: Emergency Medicine

## 2016-06-08 DIAGNOSIS — F17200 Nicotine dependence, unspecified, uncomplicated: Secondary | ICD-10-CM | POA: Insufficient documentation

## 2016-06-08 DIAGNOSIS — K5900 Constipation, unspecified: Secondary | ICD-10-CM | POA: Diagnosis not present

## 2016-06-08 DIAGNOSIS — Z7982 Long term (current) use of aspirin: Secondary | ICD-10-CM | POA: Diagnosis not present

## 2016-06-08 DIAGNOSIS — J449 Chronic obstructive pulmonary disease, unspecified: Secondary | ICD-10-CM | POA: Diagnosis not present

## 2016-06-08 LAB — COMPREHENSIVE METABOLIC PANEL
ALK PHOS: 94 U/L (ref 38–126)
ALT: 17 U/L (ref 17–63)
AST: 22 U/L (ref 15–41)
Albumin: 3.7 g/dL (ref 3.5–5.0)
Anion gap: 7 (ref 5–15)
BUN: 14 mg/dL (ref 6–20)
CHLORIDE: 108 mmol/L (ref 101–111)
CO2: 22 mmol/L (ref 22–32)
Calcium: 9 mg/dL (ref 8.9–10.3)
Creatinine, Ser: 0.89 mg/dL (ref 0.61–1.24)
GFR calc Af Amer: >60 mL/min (ref >60)
GFR calc non Af Amer: >60 mL/min (ref >60)
GLUCOSE: 97 mg/dL (ref 65–99)
Potassium: 4 mmol/L (ref 3.5–5.1)
SODIUM: 137 mmol/L (ref 135–145)
Total Bilirubin: 1.2 mg/dL (ref 0.3–1.2)
Total Protein: 6.4 g/dL — ABNORMAL LOW (ref 6.5–8.1)

## 2016-06-08 LAB — CBC
HCT: 51.6 % (ref 39.0–52.0)
Hemoglobin: 18 g/dL — ABNORMAL HIGH (ref 13.0–17.0)
MCH: 34.7 pg — ABNORMAL HIGH (ref 26.0–34.0)
MCHC: 34.9 g/dL (ref 30.0–36.0)
MCV: 99.6 fL (ref 78.0–100.0)
Platelets: 145 10*3/uL — ABNORMAL LOW (ref 150–400)
RBC: 5.18 MIL/uL (ref 4.22–5.81)
RDW: 13.6 % (ref 11.5–15.5)
WBC: 8.8 10*3/uL (ref 4.0–10.5)

## 2016-06-08 LAB — URINALYSIS, ROUTINE W REFLEX MICROSCOPIC
Bilirubin Urine: NEGATIVE
GLUCOSE, UA: NEGATIVE mg/dL
Hgb urine dipstick: NEGATIVE
Ketones, ur: NEGATIVE mg/dL
Leukocytes, UA: NEGATIVE
Nitrite: NEGATIVE
PH: 7.5 (ref 5.0–8.0)
Protein, ur: NEGATIVE mg/dL
SPECIFIC GRAVITY, URINE: 1.016 (ref 1.005–1.030)

## 2016-06-08 IMAGING — DX DG ABDOMEN ACUTE W/ 1V CHEST
4 series · 4 of 4 positions shown · non-contrast
Comparison: CT abdomen and pelvis [DATE]

CLINICAL DATA: Constipation for 4 days, not improved with laxative.

EXAM:
DG ABDOMEN ACUTE W/ 1V CHEST

[chest pa]
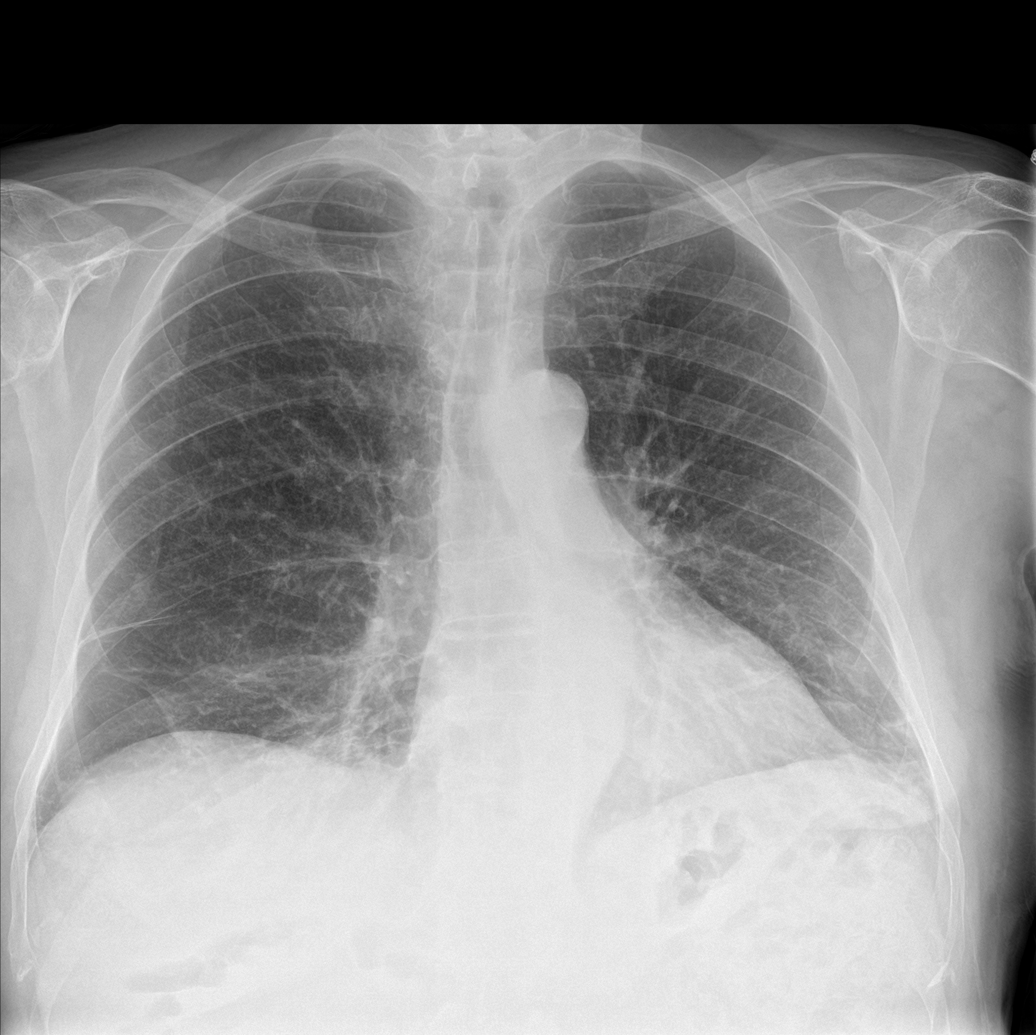

[abdomen erect]
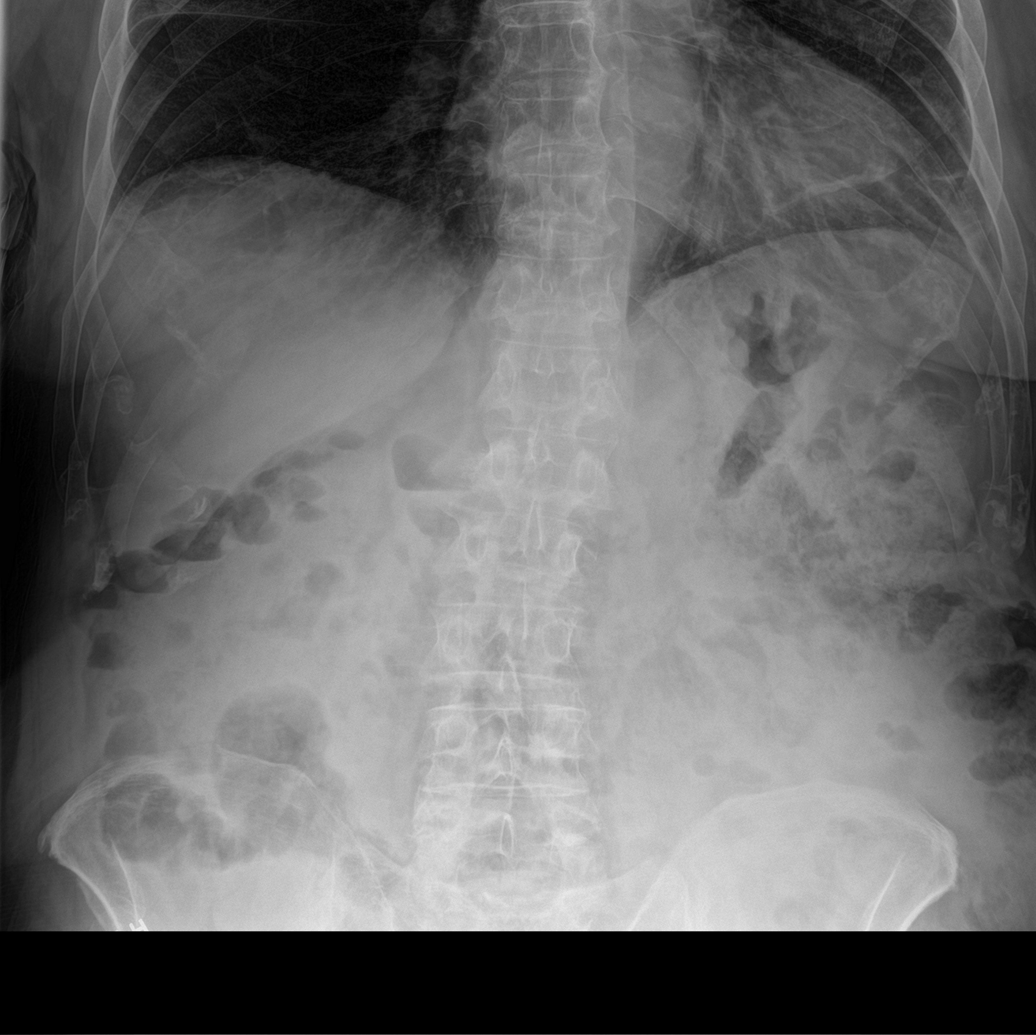

[abdomen supine (1 of 2)]
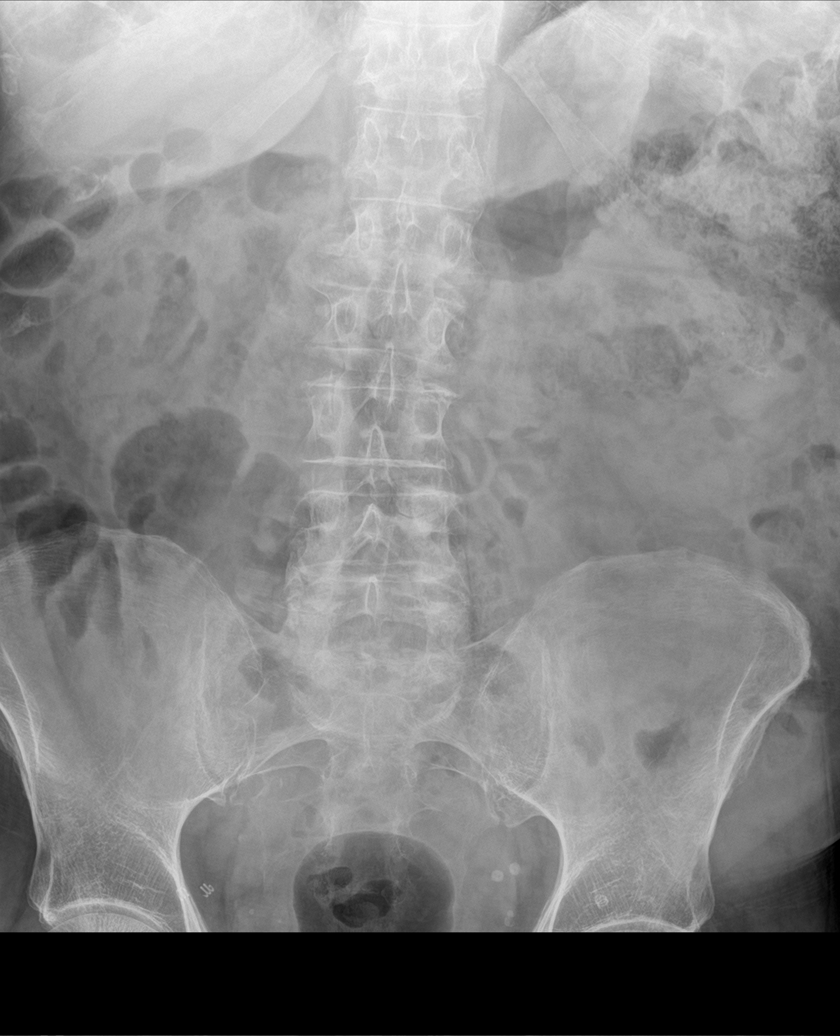

[abdomen supine (2 of 2)]
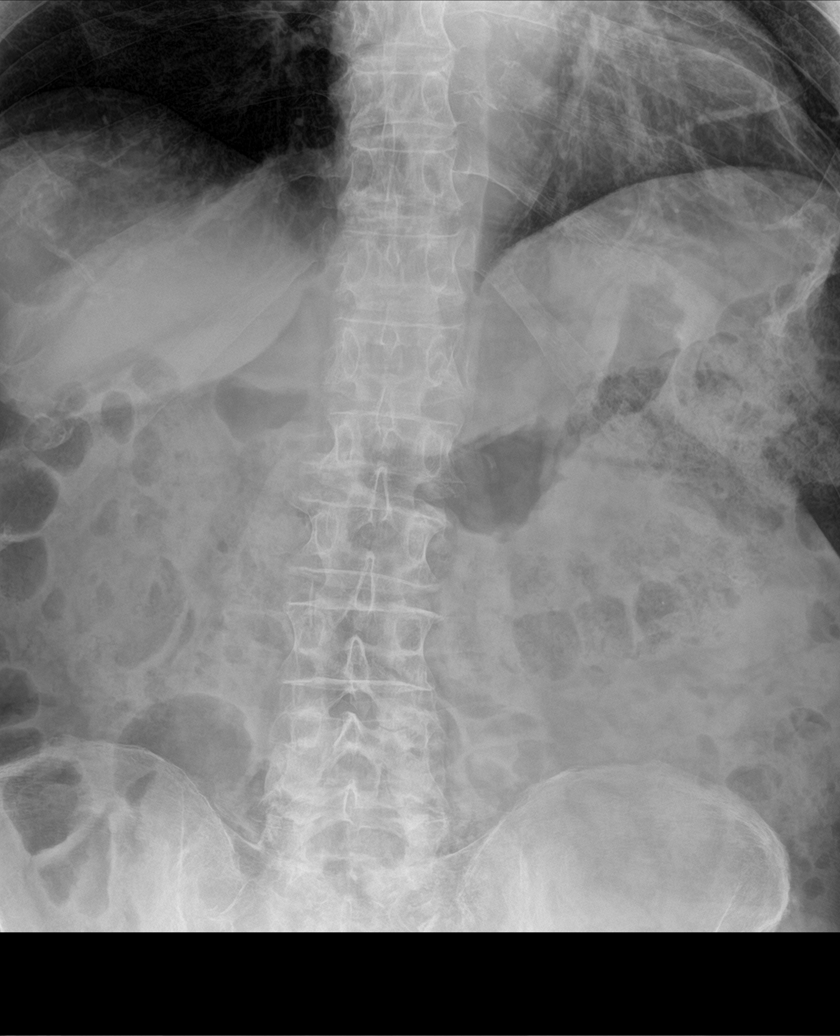

[4 of 4 positions shown; findings below may reference images not displayed]

FINDINGS: Cardiomediastinal silhouette is normal. Bibasilar strandy densities.
Lungs are otherwise clear, no pleural effusions. No pneumothorax.
Soft tissue planes and included osseous structures are nonacute. Old
RIGHT posterior ninth rib fracture.

Bowel gas pattern is nondilated and nonobstructive. Moderate amount
of retained large bowel stool. No intra-abdominal mass effect,
pathologic calcifications or free air. Soft tissue planes and
included osseous structures are non-suspicious. Status post inguinal
herniorrhaphy. Phleboliths project in the pelvis.
IMPRESSION: Bibasilar atelectasis.

Moderate amount of retained large bowel stool, nonobstructive bowel
gas pattern.

## 2016-06-08 MED ORDER — POLYETHYLENE GLYCOL 3350 17 G PO PACK: 17.0000 g | PACK | Freq: Every day | ORAL | Status: DC

## 2016-06-08 NOTE — ED Notes (Signed)
Per pt, "ive not had a bowel movement for several days, i took a laxative last night and its not worked, i also took an enema and it didn't have any affect either". Denies pain. Pt AAOX4, ambulatory with steady gait. VSS.

## 2016-06-08 NOTE — ED Notes (Signed)
Patient transported to X-ray 

## 2016-06-08 NOTE — Discharge Instructions (Signed)
Return to the ED with any concerns including abdominal pain, vomiting, decreased level of alertness/lethargy, or any other alarming symptoms  You should take miralax 2-3 times daily until you have a bowel movement, if you start to have loose stools, then decrease miralax to once daily

## 2016-06-08 NOTE — ED Provider Notes (Signed)
CSN: [id number]     Arrival date & time 06/08/16  1803 History   First MD Initiated Contact with Patient 06/08/16 2139     Chief Complaint  Patient presents with  . Constipation     (Consider location/radiation/quality/duration/timing/severity/associated sxs/prior Treatment) HPI  Pt presenting with c/o constipation.  He states his last bm was several days ago.  He took milk of magnesia last night and did a fleets enema today without passing a stool. He states he does have issues with constipation at times and becomes more irregular with age.  No abdominal pain, no vomiting.  No dysuria.  No fever.  There are no other associated systemic symptoms, there are no other alleviating or modifying factors.  He states he has been advised by GI in the past to take miralax but does not take it regularly.    Past Medical History  Diagnosis Date  . Unspecified sleep apnea     Chronic  . Tobacco use   . Depression   . Osteoarthrosis, unspecified whether generalized or localized, unspecified site   . Unspecified glaucoma   . COPD (chronic obstructive pulmonary disease) ([place])   . Syncope     Carotids, cardiology   Past Surgical History  Procedure Laterality Date  . Hernia repair     No family history on file. Social History  Substance Use Topics  . Smoking status: Current Every Day Smoker  . Smokeless tobacco: None  . Alcohol Use: Yes    Review of Systems  ROS reviewed and all otherwise negative except for mentioned in HPI    Allergies  Review of patient's allergies indicates no known allergies.  Home Medications   Prior to Admission medications   Medication Sig Start Date End Date Taking? Authorizing Provider  aspirin 81 MG tablet Take 81 mg by mouth every evening.    Yes Historical Provider, MD  cholecalciferol (VITAMIN D) 1000 UNITS tablet Take 2,000 Units by mouth daily.    Yes Historical Provider, MD  escitalopram (LEXAPRO) 10 MG tablet Take 10 mg by mouth daily. 05/21/16  Yes  Historical Provider, MD  hydrocortisone 2.5 % cream Apply 1 application topically daily. 05/21/16  Yes Historical Provider, MD  ketoconazole (NIZORAL) 2 % cream Apply 1 application topically daily as needed. 03/27/16  Yes Historical Provider, MD  latanoprost (XALATAN) 0.005 % ophthalmic solution Place 1 drop into the left eye at bedtime. As directed 10/15/13  Yes Historical Provider, MD  polyethylene glycol (MIRALAX) packet Take 17 g by mouth daily. 06/08/16   Alfonzo Beers, MD   BP 140/64 mmHg  Pulse 55  Temp(Src) 97.9 F (36.6 C) (Oral)  Resp 18  SpO2 94%  Vitals reviewed Physical Exam  Physical Examination: General appearance - alert, well appearing, and in no distress Mental status - alert, oriented to person, place, and time Eyes - no conjunctival injection, no scleral icterus Mouth - mucous membranes moist, pharynx normal without lesions Chest - clear to auscultation, no wheezes, rales or rhonchi, symmetric air entry Heart - normal rate, regular rhythm, normal S1, S2, no murmurs, rubs, clicks or gallops Abdomen - soft, nontender, nondistended, no masses or organomegaly Neurological - alert, oriented, normal speech Extremities - peripheral pulses normal, no pedal edema, no clubbing or cyanosis Skin - normal coloration and turgor, no rashes  ED Course  Procedures (including critical care time) Labs Review Labs Reviewed  CBC - Abnormal; Notable for the following:    Hemoglobin 18.0 (*)    MCH 34.7 (*)  Platelets 145 (*)    All other components within normal limits  COMPREHENSIVE METABOLIC PANEL - Abnormal; Notable for the following:    Total Protein 6.4 (*)    All other components within normal limits  URINALYSIS, ROUTINE W REFLEX MICROSCOPIC (NOT AT [facility]) - Abnormal; Notable for the following:    APPearance HAZY (*)    All other components within normal limits    Imaging Review Dg Abd Acute W/chest  06/08/2016  CLINICAL DATA:  Constipation for 4 days, not improved with  laxative. EXAM: DG ABDOMEN ACUTE W/ 1V CHEST COMPARISON:  CT abdomen and pelvis Apr 22, 2009 FINDINGS: Cardiomediastinal silhouette is normal. Bibasilar strandy densities. Lungs are otherwise clear, no pleural effusions. No pneumothorax. Soft tissue planes and included osseous structures are nonacute. Old RIGHT posterior ninth rib fracture. Bowel gas pattern is nondilated and nonobstructive. Moderate amount of retained large bowel stool. No intra-abdominal mass effect, pathologic calcifications or free air. Soft tissue planes and included osseous structures are non-suspicious. Status post inguinal herniorrhaphy. Phleboliths project in the pelvis. IMPRESSION: Bibasilar atelectasis. Moderate amount of retained large bowel stool, nonobstructive bowel gas pattern. Electronically Signed   By: Elon Alas M.D.   On: 06/08/2016 23:20   I have personally reviewed and evaluated these images and lab results as part of my medical decision-making.   EKG Interpretation None      MDM   Final diagnoses:  Constipation, unspecified constipation type    Pt presenting with c/o constipation, he has no abdominal pain, no vomiting.  Xray is c/w constipation but does not show volvulus, sbo or other acute emergent problem.  Advised starting miralax, continuing milk of magnesia.  Discharged with strict return precautions.  Pt agreeable with plan.    Alfonzo Beers, MD 06/08/16 [phone]

## 2016-06-08 NOTE — ED Notes (Signed)
Pt back in room.

## 2016-06-11 DIAGNOSIS — Z85828 Personal history of other malignant neoplasm of skin: Secondary | ICD-10-CM | POA: Diagnosis not present

## 2016-06-11 DIAGNOSIS — L570 Actinic keratosis: Secondary | ICD-10-CM | POA: Diagnosis not present

## 2016-08-23 ENCOUNTER — Telehealth: Payer: Self-pay | Admitting: Cardiology

## 2016-08-23 NOTE — Telephone Encounter (Signed)
New message    Pt wants to know if he needs to bring anything for his appt on Monday ie CPAP machine. Please call.

## 2016-08-23 NOTE — Telephone Encounter (Signed)
Left detailed message on patient's voice mail that he should bring his download card or "chip" as per Talbert Cage, CMA to his appointment with Dr. Radford Pax on Monday. Left call back number in case of other questions.

## 2016-08-27 ENCOUNTER — Ambulatory Visit (INDEPENDENT_AMBULATORY_CARE_PROVIDER_SITE_OTHER): Payer: Medicare Other | Admitting: Cardiology

## 2016-08-27 ENCOUNTER — Encounter: Payer: Self-pay | Admitting: Cardiology

## 2016-08-27 VITALS — BP 120/58 | HR 79 | Ht 72.0 in | Wt 212.0 lb

## 2016-08-27 DIAGNOSIS — G4733 Obstructive sleep apnea (adult) (pediatric): Secondary | ICD-10-CM | POA: Diagnosis not present

## 2016-08-27 NOTE — Progress Notes (Signed)
Cardiology Office Note    Date:  08/27/2016   ID:  GIANLUCAS GOVIN, DOB [birth year], MRN [id number]  PCP:  Geoffery Lyons, MD  Cardiologist:  Fransico Him, MD   No chief complaint on file.   History of Present Illness:  Wayne Walls is a 82 y.o. male who presents for followup of OSA.  He was followed with me in the past but has not seen me in several years.  He is doing well with his CPAP therapy.  He tolerates his device well.  He tolerates the nasal mask and feels the pressure is adequate.  He doesn't really feel rested in the am.  He goes to sleep around 11-12MN and get up from 8-10am but gets up 1-2 times at night. He occasionally gets sleepy during the day and has to nap.  He does not have any significant mouth dryness or nasal congestion.   Past Medical History:  Diagnosis Date  . COPD (chronic obstructive pulmonary disease) ([place])   . Depression   . Osteoarthrosis, unspecified whether generalized or localized, unspecified site   . Syncope    Carotids, cardiology  . Tobacco use   . Unspecified glaucoma   . Unspecified sleep apnea    Chronic    Past Surgical History:  Procedure Laterality Date  . HERNIA REPAIR      Current Medications: Outpatient Medications Prior to Visit  Medication Sig Dispense Refill  . aspirin 81 MG tablet Take 81 mg by mouth every evening.     . cholecalciferol (VITAMIN D) 1000 UNITS tablet Take 2,000 Units by mouth daily.     Marland Kitchen escitalopram (LEXAPRO) 10 MG tablet Take 10 mg by mouth daily.    . hydrocortisone 2.5 % cream Apply 1 application topically daily.    Marland Kitchen ketoconazole (NIZORAL) 2 % cream Apply 1 application topically daily as needed.    . latanoprost (XALATAN) 0.005 % ophthalmic solution Place 1 drop into the left eye at bedtime. As directed    . polyethylene glycol (MIRALAX) packet Take 17 g by mouth daily. 14 each 0   No facility-administered medications prior to visit.      Allergies:   Review of patient's allergies indicates  no known allergies.   Social History   Social History  . Marital status: Married    Spouse name: N/A  . Number of children: N/A  . Years of education: N/A   Social History Main Topics  . Smoking status: Current Every Day Smoker  . Smokeless tobacco: Never Used  . Alcohol use Yes  . Drug use: Unknown  . Sexual activity: Not Asked   Other Topics Concern  . None   Social History Narrative  . None     Family History:  The patient's family history includes Heart disease in his mother; Hypertension in his mother.   ROS:   Please see the history of present illness.    ROS All other systems reviewed and are negative.  No flowsheet data found.     PHYSICAL EXAM:   VS:  BP (!) 120/58   Pulse 79   Ht 6' (1.829 m)   Wt 212 lb (96.2 kg)   SpO2 93%   BMI 28.75 kg/m    GEN: Well nourished, well developed, in no acute distress  HEENT: normal  Neck: no JVD, carotid bruits, or masses Cardiac: RRR; no murmurs, rubs, or gallops,no edema.  Intact distal pulses bilaterally.  Respiratory:  clear to auscultation bilaterally, normal work  of breathing GI: soft, nontender, nondistended, + BS MS: no deformity or atrophy  Skin: warm and dry, no rash Neuro:  Alert and Oriented x 3, Strength and sensation are intact Psych: euthymic mood, full affect  Wt Readings from Last 3 Encounters:  08/27/16 212 lb (96.2 kg)  07/13/15 215 lb 12.8 oz (97.9 kg)  11/17/13 212 lb (96.2 kg)      Studies/Labs Reviewed:   EKG:  EKG is not ordered today.    Recent Labs: 06/08/2016: ALT 17; BUN 14; Creatinine, Ser 0.89; Hemoglobin 18.0; Platelets 145; Potassium 4.0; Sodium 137   Lipid Panel No results found for: CHOL, TRIG, HDL, CHOLHDL, VLDL, LDLCALC, LDLDIRECT  Additional studies/ records that were reviewed today include:  CPAP d/l    ASSESSMENT:    1. OSA (obstructive sleep apnea)      PLAN:  In order of problems listed above:  OSA - the patient is tolerating PAP therapy well without  any problems. The PAP download was reviewed today and showed an AHI of 4.2/hr on 11 cm H2O with 100% compliance in using more than 4 hours nightly.  The patient has been using and benefiting from CPAP use and will continue to benefit from therapy.      Medication Adjustments/Labs and Tests Ordered: Current medicines are reviewed at length with the patient today.  Concerns regarding medicines are outlined above.  Medication changes, Labs and Tests ordered today are listed in the Patient Instructions below.  There are no Patient Instructions on file for this visit.   Signed, Fransico Him, MD  08/27/2016 2:13 PM    [facility] [place], [place], [place]  [address] Phone: [phone]; Fax: [phone]

## 2016-08-27 NOTE — Patient Instructions (Signed)

## 2016-08-27 NOTE — Addendum Note (Signed)
Addended by: Harland German A on: 08/27/2016 02:30 PM   Modules accepted: Orders

## 2016-09-25 ENCOUNTER — Telehealth: Payer: Self-pay | Admitting: Cardiology

## 2016-09-25 NOTE — Telephone Encounter (Signed)
According to his set up date the cut off would be January 9th.   Patient is aware of this date, and knows that he will have to keep that appointment on 12/10/16 in order to be compliant.     I let him know to call if he had any further questions.

## 2016-09-25 NOTE — Telephone Encounter (Signed)
Mr. Wayne Walls wanted has changed his appt to 12/11/15 and wants to know will that still be in compliant . Please call

## 2016-10-11 DIAGNOSIS — Z85828 Personal history of other malignant neoplasm of skin: Secondary | ICD-10-CM | POA: Diagnosis not present

## 2016-10-11 DIAGNOSIS — L821 Other seborrheic keratosis: Secondary | ICD-10-CM | POA: Diagnosis not present

## 2016-10-11 DIAGNOSIS — D225 Melanocytic nevi of trunk: Secondary | ICD-10-CM | POA: Diagnosis not present

## 2016-10-11 DIAGNOSIS — C44712 Basal cell carcinoma of skin of right lower limb, including hip: Secondary | ICD-10-CM | POA: Diagnosis not present

## 2016-10-11 DIAGNOSIS — L858 Other specified epidermal thickening: Secondary | ICD-10-CM | POA: Diagnosis not present

## 2016-10-11 DIAGNOSIS — L309 Dermatitis, unspecified: Secondary | ICD-10-CM | POA: Diagnosis not present

## 2016-10-11 DIAGNOSIS — D1801 Hemangioma of skin and subcutaneous tissue: Secondary | ICD-10-CM | POA: Diagnosis not present

## 2016-10-11 DIAGNOSIS — C44722 Squamous cell carcinoma of skin of right lower limb, including hip: Secondary | ICD-10-CM | POA: Diagnosis not present

## 2016-10-16 DIAGNOSIS — L270 Generalized skin eruption due to drugs and medicaments taken internally: Secondary | ICD-10-CM | POA: Diagnosis not present

## 2016-10-16 DIAGNOSIS — Z85828 Personal history of other malignant neoplasm of skin: Secondary | ICD-10-CM | POA: Diagnosis not present

## 2016-10-16 DIAGNOSIS — L309 Dermatitis, unspecified: Secondary | ICD-10-CM | POA: Diagnosis not present

## 2016-10-17 DIAGNOSIS — Z79899 Other long term (current) drug therapy: Secondary | ICD-10-CM | POA: Diagnosis not present

## 2016-10-17 DIAGNOSIS — D751 Secondary polycythemia: Secondary | ICD-10-CM | POA: Diagnosis not present

## 2016-10-17 DIAGNOSIS — Z125 Encounter for screening for malignant neoplasm of prostate: Secondary | ICD-10-CM | POA: Diagnosis not present

## 2016-10-17 DIAGNOSIS — K5909 Other constipation: Secondary | ICD-10-CM | POA: Diagnosis not present

## 2016-10-24 DIAGNOSIS — M1990 Unspecified osteoarthritis, unspecified site: Secondary | ICD-10-CM | POA: Diagnosis not present

## 2016-10-24 DIAGNOSIS — Z23 Encounter for immunization: Secondary | ICD-10-CM | POA: Diagnosis not present

## 2016-10-24 DIAGNOSIS — R9721 Rising PSA following treatment for malignant neoplasm of prostate: Secondary | ICD-10-CM | POA: Diagnosis not present

## 2016-10-24 DIAGNOSIS — J449 Chronic obstructive pulmonary disease, unspecified: Secondary | ICD-10-CM | POA: Diagnosis not present

## 2016-10-24 DIAGNOSIS — N401 Enlarged prostate with lower urinary tract symptoms: Secondary | ICD-10-CM | POA: Diagnosis not present

## 2016-10-24 DIAGNOSIS — K5909 Other constipation: Secondary | ICD-10-CM | POA: Diagnosis not present

## 2016-10-24 DIAGNOSIS — Z0000 Encounter for general adult medical examination without abnormal findings: Secondary | ICD-10-CM | POA: Diagnosis not present

## 2016-10-24 DIAGNOSIS — D751 Secondary polycythemia: Secondary | ICD-10-CM | POA: Diagnosis not present

## 2016-10-24 DIAGNOSIS — Z720 Tobacco use: Secondary | ICD-10-CM | POA: Diagnosis not present

## 2016-10-24 DIAGNOSIS — F329 Major depressive disorder, single episode, unspecified: Secondary | ICD-10-CM | POA: Diagnosis not present

## 2016-10-24 DIAGNOSIS — Z6828 Body mass index (BMI) 28.0-28.9, adult: Secondary | ICD-10-CM | POA: Diagnosis not present

## 2016-11-02 ENCOUNTER — Other Ambulatory Visit: Payer: Self-pay | Admitting: Internal Medicine

## 2016-11-02 ENCOUNTER — Ambulatory Visit
Admission: RE | Admit: 2016-11-02 | Discharge: 2016-11-02 | Disposition: A | Discharge status: Home / Self Care | Payer: Medicare Other | Source: Ambulatory Visit | Attending: Internal Medicine | Admitting: Internal Medicine

## 2016-11-02 DIAGNOSIS — J449 Chronic obstructive pulmonary disease, unspecified: Secondary | ICD-10-CM

## 2016-11-02 DIAGNOSIS — L308 Other specified dermatitis: Secondary | ICD-10-CM | POA: Diagnosis not present

## 2016-11-02 DIAGNOSIS — C44712 Basal cell carcinoma of skin of right lower limb, including hip: Secondary | ICD-10-CM | POA: Diagnosis not present

## 2016-11-02 DIAGNOSIS — Z85828 Personal history of other malignant neoplasm of skin: Secondary | ICD-10-CM | POA: Diagnosis not present

## 2016-11-02 IMAGING — CR DG CHEST 2V
2 series · 2 of 2 positions shown · non-contrast
Comparison: Chest x-ray of [DATE] and [DATE].

CLINICAL DATA: COPD, current smoker.

EXAM:
CHEST  2 VIEW

[w chest pa]
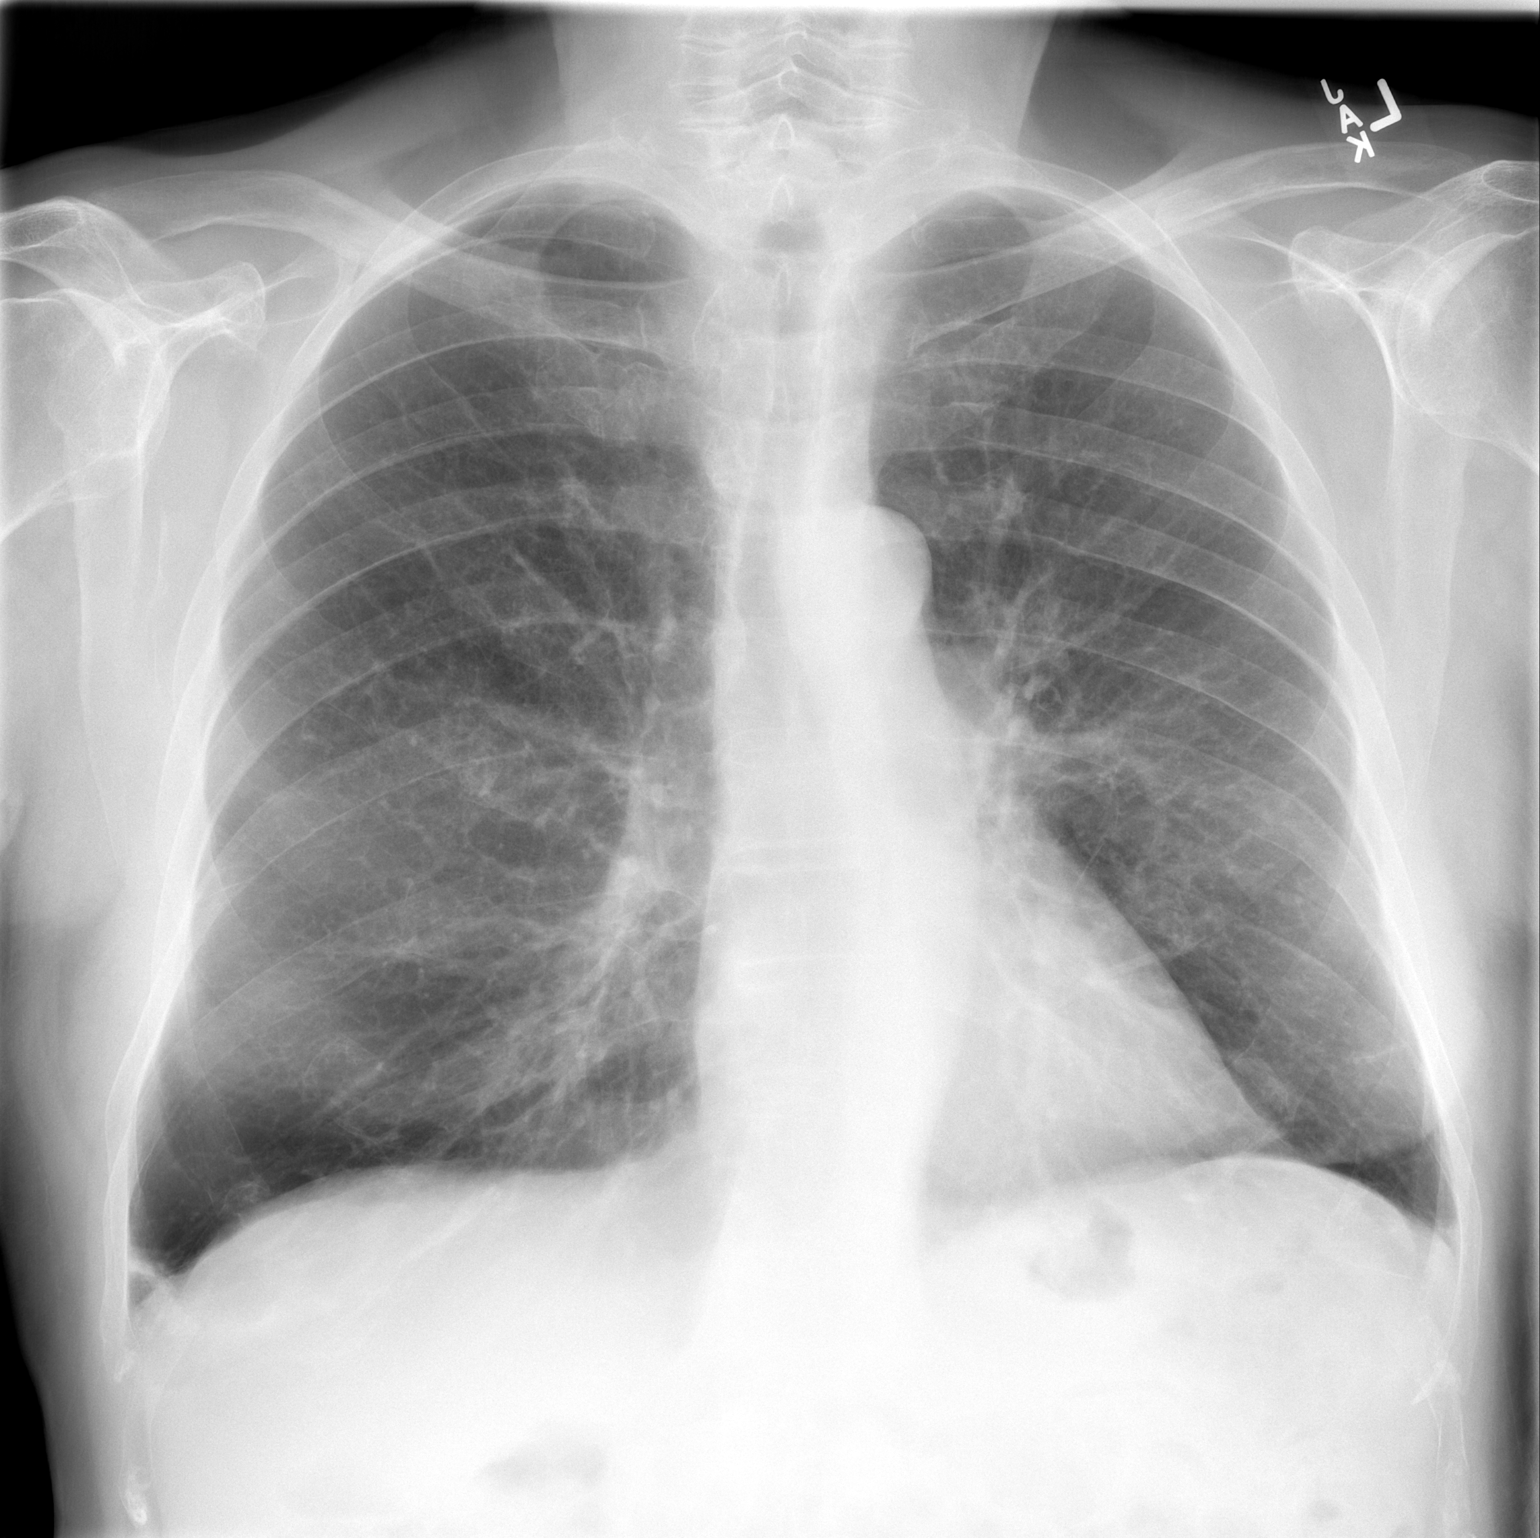

[w chest lat]
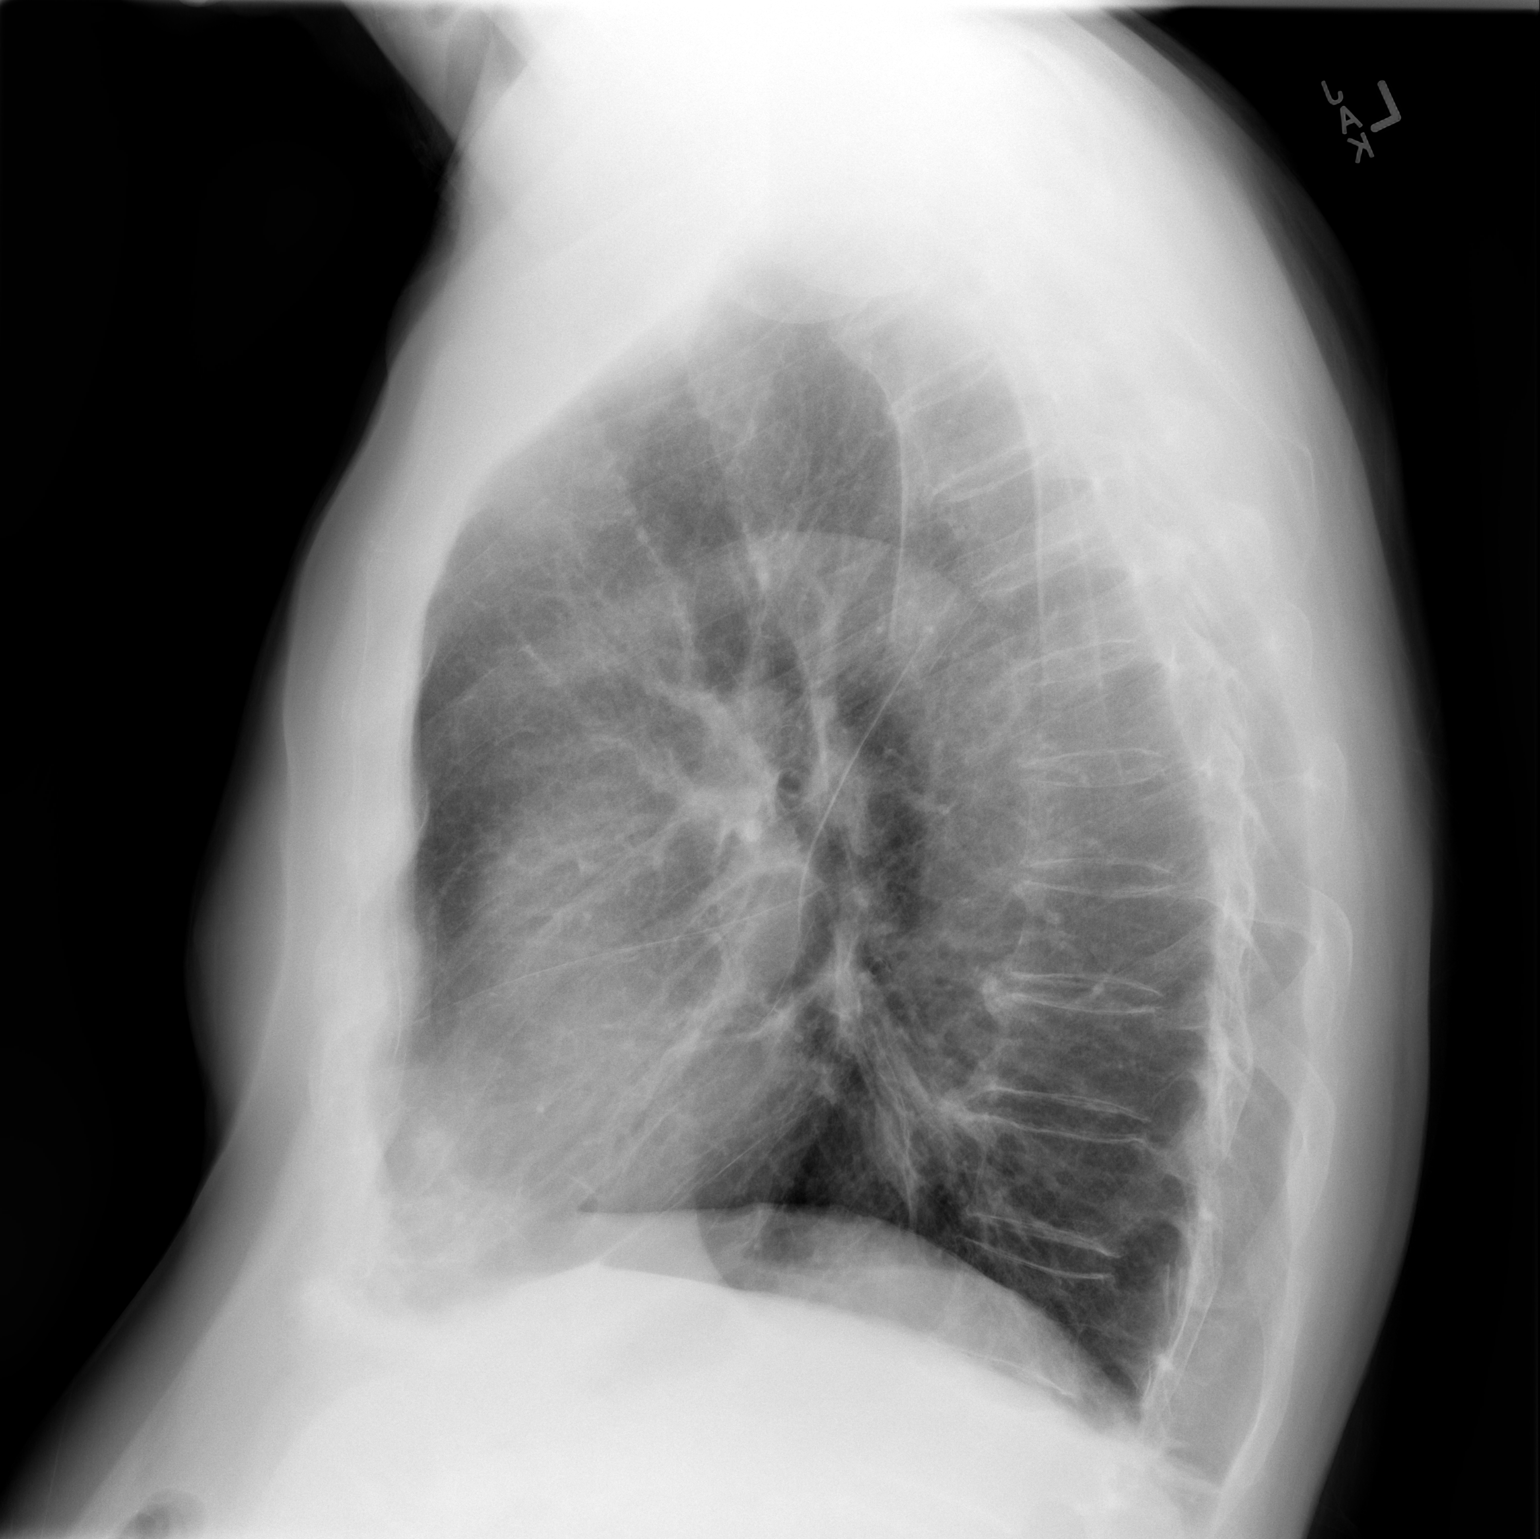

[2 of 2 positions shown; findings below may reference images not displayed]

FINDINGS: The lungs remain hyperinflated. The interstitial markings have
improved consistent with resolution of the atelectasis. Minimal
density persists in the costophrenic gutters most compatible with
scarring. The heart and pulmonary vascularity are normal. The
mediastinum is normal in width. There is no pleural effusion. The
bony thorax is unremarkable.
IMPRESSION: COPD. No pneumonia, CHF, nor other acute cardiopulmonary
abnormality.

## 2016-11-15 ENCOUNTER — Ambulatory Visit: Payer: Medicare Other | Admitting: Cardiology

## 2016-11-15 DIAGNOSIS — Z1212 Encounter for screening for malignant neoplasm of rectum: Secondary | ICD-10-CM | POA: Diagnosis not present

## 2016-12-04 ENCOUNTER — Telehealth: Payer: Self-pay

## 2016-12-04 NOTE — Telephone Encounter (Signed)
Received note from [facility] technician that the patient left a message on her extension that he would like to reschedule OV with Dr. Radford Pax to this week. Informed patient that that would not be possible as Dr. Radford Pax is not in the office this week. Reiterated to patient that only the main [facility] number and the CPAP Assistant's number should be used. Confirmed numbers with patient. He agrees with treatment plan.

## 2016-12-10 ENCOUNTER — Ambulatory Visit (INDEPENDENT_AMBULATORY_CARE_PROVIDER_SITE_OTHER): Payer: Medicare Other | Admitting: Cardiology

## 2016-12-10 ENCOUNTER — Ambulatory Visit: Payer: Medicare Other | Admitting: Cardiology

## 2016-12-10 ENCOUNTER — Encounter: Payer: Self-pay | Admitting: Cardiology

## 2016-12-10 VITALS — BP 124/60 | HR 66 | Ht 72.0 in | Wt 212.8 lb

## 2016-12-10 DIAGNOSIS — G4733 Obstructive sleep apnea (adult) (pediatric): Secondary | ICD-10-CM

## 2016-12-10 NOTE — Progress Notes (Signed)
Cardiology Office Note    Date:  12/10/2016   ID:  Wayne Walls, DOB 15-Jul-1933, MRN [id number]  PCP:  Geoffery Lyons, MD  Cardiologist:  Fransico Him, MD   Chief Complaint  Patient presents with  . Sleep Apnea    History of Present Illness:  Wayne Walls is a 83 y.o. male  who presents for followup of OSA. He is doing well with his CPAP therapy and got a new device several months ago. He tolerates his device well. He tolerates the nasal mask and feels the pressure is adequate. He does not think that he snores.  He feels rested in the am for the most part.   He occasionally gets sleepy during the day and has to nap.  He does not have any significant mouth dryness or nasal congestion.     Past Medical History:  Diagnosis Date  . COPD (chronic obstructive pulmonary disease) ([place])   . Depression   . Osteoarthrosis, unspecified whether generalized or localized, unspecified site   . Syncope    Carotids, cardiology  . Tobacco use   . Unspecified glaucoma(365.9)   . Unspecified sleep apnea    Chronic    Past Surgical History:  Procedure Laterality Date  . HERNIA REPAIR      Current Medications: Outpatient Medications Prior to Visit  Medication Sig Dispense Refill  . aspirin 81 MG tablet Take 81 mg by mouth every evening.     . cholecalciferol (VITAMIN D) 1000 UNITS tablet Take 2,000 Units by mouth daily.     Marland Kitchen escitalopram (LEXAPRO) 10 MG tablet Take 10 mg by mouth daily.    . hydrocortisone 2.5 % cream Apply 1 application topically daily.    Marland Kitchen ketoconazole (NIZORAL) 2 % cream Apply 1 application topically daily as needed.    . latanoprost (XALATAN) 0.005 % ophthalmic solution Place 1 drop into the left eye at bedtime. As directed    . polyethylene glycol (MIRALAX) packet Take 17 g by mouth daily. 14 each 0   No facility-administered medications prior to visit.      Allergies:   Patient has no known allergies.   Social History   Social History  . Marital  status: Married    Spouse name: N/A  . Number of children: N/A  . Years of education: N/A   Social History Main Topics  . Smoking status: Current Every Day Smoker  . Smokeless tobacco: Never Used  . Alcohol use Yes  . Drug use: Unknown  . Sexual activity: Not Asked   Other Topics Concern  . None   Social History Narrative  . None     Family History:  The patient's family history includes Heart disease in his mother; Hypertension in his mother.   ROS:   Please see the history of present illness.    ROS All other systems reviewed and are negative.  No flowsheet data found.     PHYSICAL EXAM:   VS:  BP 124/60   Pulse 66   Ht 6' (1.829 m)   Wt 212 lb 12.8 oz (96.5 kg)   BMI 28.86 kg/m    GEN: Well nourished, well developed, in no acute distress  HEENT: normal  Neck: no JVD, carotid bruits, or masses Cardiac: RRR; no murmurs, rubs, or gallops,no edema.  Intact distal pulses bilaterally.  Respiratory:  clear to auscultation bilaterally, normal work of breathing GI: soft, nontender, nondistended, + BS MS: no deformity or atrophy  Skin: warm and  dry, no rash Neuro:  Alert and Oriented x 3, Strength and sensation are intact Psych: euthymic mood, full affect  Wt Readings from Last 3 Encounters:  12/10/16 212 lb 12.8 oz (96.5 kg)  08/27/16 212 lb (96.2 kg)  07/13/15 215 lb 12.8 oz (97.9 kg)      Studies/Labs Reviewed:   EKG:  EKG is not ordered today.    Recent Labs: 06/08/2016: ALT 17; BUN 14; Creatinine, Ser 0.89; Hemoglobin 18.0; Platelets 145; Potassium 4.0; Sodium 137   Lipid Panel No results found for: CHOL, TRIG, HDL, CHOLHDL, VLDL, LDLCALC, LDLDIRECT  Additional studies/ records that were reviewed today include:  CPAP download    ASSESSMENT:    1. OSA (obstructive sleep apnea)      PLAN:  In order of problems listed above:  OSA - the patient is tolerating PAP therapy well without any problems. The PAP download was reviewed today and showed an  AHI of 0.8/hr on 11 cm H2O with 100% compliance in using more than 4 hours nightly.  The patient has been using and benefiting from CPAP use and will continue to benefit from therapy.      Medication Adjustments/Labs and Tests Ordered: Current medicines are reviewed at length with the patient today.  Concerns regarding medicines are outlined above.  Medication changes, Labs and Tests ordered today are listed in the Patient Instructions below.  There are no Patient Instructions on file for this visit.   Signed, Fransico Him, MD  12/10/2016 2:56 PM    [facility], [place], [place]  [address] Phone: [phone]; Fax: [phone]

## 2016-12-10 NOTE — Patient Instructions (Signed)

## 2016-12-24 ENCOUNTER — Encounter: Payer: Self-pay | Admitting: Cardiology

## 2017-01-02 DIAGNOSIS — H25011 Cortical age-related cataract, right eye: Secondary | ICD-10-CM | POA: Diagnosis not present

## 2017-01-02 DIAGNOSIS — H2513 Age-related nuclear cataract, bilateral: Secondary | ICD-10-CM | POA: Diagnosis not present

## 2017-01-02 DIAGNOSIS — H401122 Primary open-angle glaucoma, left eye, moderate stage: Secondary | ICD-10-CM | POA: Diagnosis not present

## 2017-03-13 DIAGNOSIS — Z1389 Encounter for screening for other disorder: Secondary | ICD-10-CM | POA: Diagnosis not present

## 2017-03-13 DIAGNOSIS — K921 Melena: Secondary | ICD-10-CM | POA: Diagnosis not present

## 2017-03-13 DIAGNOSIS — J449 Chronic obstructive pulmonary disease, unspecified: Secondary | ICD-10-CM | POA: Diagnosis not present

## 2017-03-13 DIAGNOSIS — N401 Enlarged prostate with lower urinary tract symptoms: Secondary | ICD-10-CM | POA: Diagnosis not present

## 2017-03-13 DIAGNOSIS — K5909 Other constipation: Secondary | ICD-10-CM | POA: Diagnosis not present

## 2017-03-13 DIAGNOSIS — D751 Secondary polycythemia: Secondary | ICD-10-CM | POA: Diagnosis not present

## 2017-03-13 DIAGNOSIS — F329 Major depressive disorder, single episode, unspecified: Secondary | ICD-10-CM | POA: Diagnosis not present

## 2017-03-13 DIAGNOSIS — Z720 Tobacco use: Secondary | ICD-10-CM | POA: Diagnosis not present

## 2017-03-13 DIAGNOSIS — Z6828 Body mass index (BMI) 28.0-28.9, adult: Secondary | ICD-10-CM | POA: Diagnosis not present

## 2017-03-13 DIAGNOSIS — M1990 Unspecified osteoarthritis, unspecified site: Secondary | ICD-10-CM | POA: Diagnosis not present

## 2017-06-27 DIAGNOSIS — H401122 Primary open-angle glaucoma, left eye, moderate stage: Secondary | ICD-10-CM | POA: Diagnosis not present

## 2017-08-08 DIAGNOSIS — Z85828 Personal history of other malignant neoplasm of skin: Secondary | ICD-10-CM | POA: Diagnosis not present

## 2017-08-08 DIAGNOSIS — L308 Other specified dermatitis: Secondary | ICD-10-CM | POA: Diagnosis not present

## 2017-08-20 DIAGNOSIS — Z85828 Personal history of other malignant neoplasm of skin: Secondary | ICD-10-CM | POA: Diagnosis not present

## 2017-08-20 DIAGNOSIS — L308 Other specified dermatitis: Secondary | ICD-10-CM | POA: Diagnosis not present

## 2017-08-20 DIAGNOSIS — L304 Erythema intertrigo: Secondary | ICD-10-CM | POA: Diagnosis not present

## 2017-08-20 DIAGNOSIS — L821 Other seborrheic keratosis: Secondary | ICD-10-CM | POA: Diagnosis not present

## 2017-09-27 DIAGNOSIS — R21 Rash and other nonspecific skin eruption: Secondary | ICD-10-CM | POA: Diagnosis not present

## 2017-09-27 DIAGNOSIS — Z23 Encounter for immunization: Secondary | ICD-10-CM | POA: Diagnosis not present

## 2017-09-27 DIAGNOSIS — Z6828 Body mass index (BMI) 28.0-28.9, adult: Secondary | ICD-10-CM | POA: Diagnosis not present

## 2017-09-27 DIAGNOSIS — K921 Melena: Secondary | ICD-10-CM | POA: Diagnosis not present

## 2017-09-27 DIAGNOSIS — D751 Secondary polycythemia: Secondary | ICD-10-CM | POA: Diagnosis not present

## 2017-10-09 ENCOUNTER — Encounter: Payer: Self-pay | Admitting: Oncology

## 2017-10-15 ENCOUNTER — Telehealth: Payer: Self-pay

## 2017-10-15 ENCOUNTER — Ambulatory Visit (HOSPITAL_BASED_OUTPATIENT_CLINIC_OR_DEPARTMENT_OTHER): Payer: Medicare Other | Admitting: Oncology

## 2017-10-15 ENCOUNTER — Ambulatory Visit (HOSPITAL_BASED_OUTPATIENT_CLINIC_OR_DEPARTMENT_OTHER): Payer: Medicare Other

## 2017-10-15 VITALS — BP 137/81 | HR 75 | Temp 98.7°F | Resp 18 | Ht 72.0 in | Wt 217.0 lb

## 2017-10-15 DIAGNOSIS — D751 Secondary polycythemia: Secondary | ICD-10-CM

## 2017-10-15 DIAGNOSIS — Z720 Tobacco use: Secondary | ICD-10-CM | POA: Diagnosis not present

## 2017-10-15 LAB — CBC WITH DIFFERENTIAL/PLATELET
BASO%: 0.5 % (ref 0.0–2.0)
Basophils Absolute: 0.1 10*3/uL (ref 0.0–0.1)
EOS%: 3.6 % (ref 0.0–7.0)
Eosinophils Absolute: 0.4 10*3/uL (ref 0.0–0.5)
HEMATOCRIT: 53.2 % — AB (ref 38.4–49.9)
HGB: 18.5 g/dL — ABNORMAL HIGH (ref 13.0–17.1)
LYMPH%: 15.9 % (ref 14.0–49.0)
MCH: 35 pg — ABNORMAL HIGH (ref 27.2–33.4)
MCHC: 34.8 g/dL (ref 32.0–36.0)
MCV: 100.6 fL — ABNORMAL HIGH (ref 79.3–98.0)
MONO#: 0.8 10*3/uL (ref 0.1–0.9)
MONO%: 8.1 % (ref 0.0–14.0)
NEUT%: 71.9 % (ref 39.0–75.0)
NEUTROS ABS: 7.3 10*3/uL — AB (ref 1.5–6.5)
Platelets: 140 10*3/uL (ref 140–400)
RBC: 5.29 10*6/uL (ref 4.20–5.82)
RDW: 14.1 % (ref 11.0–14.6)
WBC: 10.2 10*3/uL (ref 4.0–10.3)
lymph#: 1.6 10*3/uL (ref 0.9–3.3)
nRBC: 0 % (ref 0–0)

## 2017-10-15 LAB — TECHNOLOGIST REVIEW

## 2017-10-15 NOTE — Progress Notes (Signed)
Reason for Referral: Polycythemia.  HPI: 83 year old gentleman currently of [place] where he lived the majority of his life.  He is a gentleman with history of osteoarthritis and tobacco use.  He has also diagnosis of obstructive sleep apnea and uses CPAP at nighttime.  He reports no major changes in his health although for the last few years he had reported symptoms of fatigue and tiredness.  He also has reported discoloration of his stool and currently under evaluation for possible colonoscopy.  He was found to have an elevated hemoglobin of 18.0 with a hematocrit of 51.  He had normal white cell count and normal platelet count in July 2017.  He denies any headaches or altered mental status.  He denies any thrombosis or bleeding episodes.  He does take low-dose aspirin.  He still ambulates without any difficulties and denies any falls or syncope.  He does not report any headaches, blurry vision, syncope or seizures.  He does not report any fevers, chills or sweats.  He does not report any cough, wheezing or hemoptysis.  He does not report any chest pain, palpitation, orthopnea or leg edema.  He does not report any nausea, vomiting or abdominal pain.  He does not report any frequency urgency or hesitancy.  He does not for any skeletal complaints.  Remainder review of systems unremarkable.  Past Medical History:  Diagnosis Date  . COPD (chronic obstructive pulmonary disease) ([place])   . Depression   . Osteoarthrosis, unspecified whether generalized or localized, unspecified site   . Syncope    Carotids, cardiology  . Tobacco use   . Unspecified glaucoma(365.9)   . Unspecified sleep apnea    Chronic  :  Past Surgical History:  Procedure Laterality Date  . HERNIA REPAIR    :   Current Outpatient Medications:  .  aspirin 81 MG tablet, Take 81 mg by mouth every evening. , Disp: , Rfl:  .  cholecalciferol (VITAMIN D) 1000 UNITS tablet, Take 2,000 Units by mouth daily. , Disp: , Rfl:  .   hydrocortisone 2.5 % cream, Apply 1 application topically daily., Disp: , Rfl:  .  ketoconazole (NIZORAL) 2 % cream, Apply 1 application topically daily as needed., Disp: , Rfl:  .  latanoprost (XALATAN) 0.005 % ophthalmic solution, Place 1 drop into the left eye at bedtime. As directed, Disp: , Rfl:  .  polyethylene glycol (MIRALAX) packet, Take 17 g by mouth daily., Disp: 14 each, Rfl: 0:  No Known Allergies:  Family History  Problem Relation Age of Onset  . Heart disease Mother   . Hypertension Mother   :  Social History   Socioeconomic History  . Marital status: Married    Spouse name: Not on file  . Number of children: Not on file  . Years of education: Not on file  . Highest education level: Not on file  Social Needs  . Financial resource strain: Not on file  . Food insecurity - worry: Not on file  . Food insecurity - inability: Not on file  . Transportation needs - medical: Not on file  . Transportation needs - non-medical: Not on file  Occupational History  . Not on file  Tobacco Use  . Smoking status: Current Every Day Smoker  . Smokeless tobacco: Never Used  Substance and Sexual Activity  . Alcohol use: Yes  . Drug use: Not on file  . Sexual activity: Not on file  Other Topics Concern  . Not on file  Social  History Narrative  . Not on file  :  Pertinent items are noted in HPI.  Exam: Blood pressure 137/81, pulse 75, temperature 98.7 F (37.1 C), temperature source Oral, resp. rate 18, height 6' (1.829 m), weight 217 lb (98.4 kg), SpO2 97 %.   ECOG 1   General appearance: alert and cooperative appeared without distress. Throat: No oral thrush or ulcers. Neck: no adenopathy or neck masses. Back: negative Resp: clear to auscultation bilaterally Chest wall: no tenderness Cardio: regular rate and rhythm, S1, S2 normal, no murmur, click, rub or gallop Extremities: extremities normal, atraumatic, no cyanosis or edema  CBC    Component Value Date/Time    WBC 8.8 06/08/2016 1822   RBC 5.18 06/08/2016 1822   HGB 18.0 (H) 06/08/2016 1822   HCT 51.6 06/08/2016 1822   PLT 145 (L) 06/08/2016 1822   MCV 99.6 06/08/2016 1822   MCH 34.7 (H) 06/08/2016 1822   MCHC 34.9 06/08/2016 1822   RDW 13.6 06/08/2016 1822     Assessment and Plan:   83 year old gentleman with the following issues:  1.  Polycythemia: This was detected dating back to at least 2017 with a hemoglobin of 18.0 and normal white cell count and platelet counts.  No thrombosis episode has been reported previously.  The differential diagnosis was discussed today with the patient.  Secondary polycythemia is the most likely etiology.  This would include smoking as well as sleep apnea which causes chronic hypoxic state and leads to chronic polycythemia.  Polycythemia vera would be also a consideration and will be investigated and ruled out today.  I will obtain JAK2 as well as erythropoietin level after a repeat CBC.  If we are dealing with secondary causes for polycythemia, I recommended stopping smoking and continuous use of his CPAP for sleep apnea.  The role of phlebotomy was also discussed today and I do not feel that it is needed unless he has polycythemia vera.  2.  Thrombosis prophylaxis: I recommended low-dose aspirin unless signs or symptoms of GI bleeding has been documented.  3.  Follow-up: Will be determined depending on his laboratory evaluation today.

## 2017-10-15 NOTE — Telephone Encounter (Signed)
No los for upcoming appointments , added patient to lab for add on per 11/13 los

## 2017-10-16 LAB — ERYTHROPOIETIN: Erythropoietin: 6.1 m[IU]/mL (ref 2.6–18.5)

## 2017-10-23 DIAGNOSIS — R82998 Other abnormal findings in urine: Secondary | ICD-10-CM | POA: Diagnosis not present

## 2017-10-23 DIAGNOSIS — Z0000 Encounter for general adult medical examination without abnormal findings: Secondary | ICD-10-CM | POA: Diagnosis not present

## 2017-10-23 DIAGNOSIS — Z125 Encounter for screening for malignant neoplasm of prostate: Secondary | ICD-10-CM | POA: Diagnosis not present

## 2017-10-30 DIAGNOSIS — K921 Melena: Secondary | ICD-10-CM | POA: Diagnosis not present

## 2017-10-30 DIAGNOSIS — F17200 Nicotine dependence, unspecified, uncomplicated: Secondary | ICD-10-CM | POA: Diagnosis not present

## 2017-10-31 DIAGNOSIS — Z0000 Encounter for general adult medical examination without abnormal findings: Secondary | ICD-10-CM | POA: Diagnosis not present

## 2017-10-31 DIAGNOSIS — Z1212 Encounter for screening for malignant neoplasm of rectum: Secondary | ICD-10-CM | POA: Diagnosis not present

## 2017-10-31 DIAGNOSIS — M1990 Unspecified osteoarthritis, unspecified site: Secondary | ICD-10-CM | POA: Diagnosis not present

## 2017-10-31 DIAGNOSIS — J449 Chronic obstructive pulmonary disease, unspecified: Secondary | ICD-10-CM | POA: Diagnosis not present

## 2017-10-31 DIAGNOSIS — N401 Enlarged prostate with lower urinary tract symptoms: Secondary | ICD-10-CM | POA: Diagnosis not present

## 2017-10-31 DIAGNOSIS — K5909 Other constipation: Secondary | ICD-10-CM | POA: Diagnosis not present

## 2017-10-31 DIAGNOSIS — G4739 Other sleep apnea: Secondary | ICD-10-CM | POA: Diagnosis not present

## 2017-10-31 DIAGNOSIS — F329 Major depressive disorder, single episode, unspecified: Secondary | ICD-10-CM | POA: Diagnosis not present

## 2017-10-31 DIAGNOSIS — Z1389 Encounter for screening for other disorder: Secondary | ICD-10-CM | POA: Diagnosis not present

## 2017-10-31 DIAGNOSIS — Z720 Tobacco use: Secondary | ICD-10-CM | POA: Diagnosis not present

## 2017-10-31 DIAGNOSIS — D751 Secondary polycythemia: Secondary | ICD-10-CM | POA: Diagnosis not present

## 2017-10-31 DIAGNOSIS — R972 Elevated prostate specific antigen [PSA]: Secondary | ICD-10-CM | POA: Diagnosis not present

## 2017-10-31 DIAGNOSIS — K921 Melena: Secondary | ICD-10-CM | POA: Insufficient documentation

## 2017-10-31 DIAGNOSIS — Z6828 Body mass index (BMI) 28.0-28.9, adult: Secondary | ICD-10-CM | POA: Diagnosis not present

## 2017-12-05 DIAGNOSIS — R413 Other amnesia: Secondary | ICD-10-CM | POA: Diagnosis not present

## 2017-12-05 DIAGNOSIS — Z6828 Body mass index (BMI) 28.0-28.9, adult: Secondary | ICD-10-CM | POA: Diagnosis not present

## 2017-12-08 ENCOUNTER — Encounter: Payer: Self-pay | Admitting: Cardiology

## 2017-12-09 NOTE — Progress Notes (Signed)
Cardiology Office Note:    Date:  12/10/2017   ID:  Wayne Walls, DOB 1933/02/03, MRN [id number]  PCP:  Burnard Bunting, MD  Cardiologist:  Fransico Him, MD   Referring MD: Burnard Bunting, MD   Chief Complaint  Patient presents with  . Sleep Apnea    History of Present Illness:    Wayne Walls is a 84 y.o. male with a hx of OSA on CPAP who is here for followup.  He is doing well with his CPAP device.  He tolerates the nasal mask and feels the pressure is adequate.  Since going on CPAP he feels rested in the am and has no significant daytime sleepiness but occasionally naps during the day.  He denies any significant mouth or nasal dryness or nasal congestion.  He does not know if he snores.     Past Medical History:  Diagnosis Date  . COPD (chronic obstructive pulmonary disease) ([place])   . Depression   . Osteoarthrosis, unspecified whether generalized or localized, unspecified site   . Syncope    Carotids, cardiology  . Tobacco use   . Unspecified glaucoma(365.9)   . Unspecified sleep apnea    Chronic    Past Surgical History:  Procedure Laterality Date  . HERNIA REPAIR      Current Medications: Current Meds  Medication Sig  . aspirin 81 MG tablet Take 81 mg by mouth every evening.   . cholecalciferol (VITAMIN D) 1000 UNITS tablet Take 2,000 Units by mouth daily.   Marland Kitchen donepezil (ARICEPT) 5 MG tablet Take 5 mg by mouth at bedtime.  . hydrocortisone 2.5 % cream Apply 1 application topically daily.  Marland Kitchen ketoconazole (NIZORAL) 2 % cream Apply 1 application topically daily as needed.  . latanoprost (XALATAN) 0.005 % ophthalmic solution Place 1 drop into the left eye at bedtime. As directed  . polyethylene glycol (MIRALAX) packet Take 17 g by mouth daily.     Allergies:   Patient has no known allergies.   Social History   Socioeconomic History  . Marital status: Married    Spouse name: None  . Number of children: None  . Years of education: None  . Highest  education level: None  Social Needs  . Financial resource strain: None  . Food insecurity - worry: None  . Food insecurity - inability: None  . Transportation needs - medical: None  . Transportation needs - non-medical: None  Occupational History  . None  Tobacco Use  . Smoking status: Current Every Day Smoker  . Smokeless tobacco: Never Used  Substance and Sexual Activity  . Alcohol use: Yes  . Drug use: None  . Sexual activity: None  Other Topics Concern  . None  Social History Narrative  . None     Family History: The patient's family history includes Heart disease in his mother; Hypertension in his mother.  ROS:   Please see the history of present illness.    ROS  All other systems reviewed and negative.   EKGs/Labs/Other Studies Reviewed:    The following studies were reviewed today: CPAP download  EKG:  EKG is not ordered today  Recent Labs: 10/15/2017: HGB 18.5; Platelets 140   Recent Lipid Panel No results found for: CHOL, TRIG, HDL, CHOLHDL, VLDL, LDLCALC, LDLDIRECT  Physical Exam:    VS:  BP 130/74   Pulse 78   Ht 6\' 1"  (1.854 m)   Wt 215 lb 12.8 oz (97.9 kg)   BMI 28.47 kg/m  Wt Readings from Last 3 Encounters:  12/10/17 215 lb 12.8 oz (97.9 kg)  10/15/17 217 lb (98.4 kg)  12/10/16 212 lb 12.8 oz (96.5 kg)     GEN:  Well nourished, well developed in no acute distress HEENT: Normal NECK: No JVD; No carotid bruits LYMPHATICS: No lymphadenopathy CARDIAC: RRR, no murmurs, rubs, gallops RESPIRATORY:  Clear to auscultation without rales, wheezing or rhonchi  ABDOMEN: Soft, non-tender, non-distended MUSCULOSKELETAL:  No edema; No deformity  SKIN: Warm and dry NEUROLOGIC:  Alert and oriented x 3 PSYCHIATRIC:  Normal affect   ASSESSMENT:    1. OSA (obstructive sleep apnea)    PLAN:    In order of problems listed above:  1.  OSA - the patient is tolerating PAP therapy well without any problems. The PAP download was reviewed today and  showed an AHI of 1.3/hr on 11 cm H2O with 100% compliance in using more than 4 hours nightly.  The patient has been using and benefiting from CPAP use and will continue to benefit from therapy.    Medication Adjustments/Labs and Tests Ordered: Current medicines are reviewed at length with the patient today.  Concerns regarding medicines are outlined above.  No orders of the defined types were placed in this encounter.  No orders of the defined types were placed in this encounter.   Signed, Fransico Him, MD  12/10/2017 11:56 AM    [place]

## 2017-12-10 ENCOUNTER — Ambulatory Visit (INDEPENDENT_AMBULATORY_CARE_PROVIDER_SITE_OTHER): Payer: Medicare Other | Admitting: Cardiology

## 2017-12-10 ENCOUNTER — Encounter: Payer: Self-pay | Admitting: Cardiology

## 2017-12-10 ENCOUNTER — Encounter (INDEPENDENT_AMBULATORY_CARE_PROVIDER_SITE_OTHER): Payer: Self-pay

## 2017-12-10 VITALS — BP 130/74 | HR 78 | Ht 73.0 in | Wt 215.8 lb

## 2017-12-10 DIAGNOSIS — G4733 Obstructive sleep apnea (adult) (pediatric): Secondary | ICD-10-CM | POA: Diagnosis not present

## 2017-12-10 NOTE — Patient Instructions (Signed)
Medication Instructions:  Your physician recommends that you continue on your current medications as directed. Please refer to the Current Medication list given to you today.  Labwork: None ordered   Testing/Procedures: None ordered   Follow-Up: Your physician wants you to follow-up in: 1 year with Dr. Radford Pax. You will receive a reminder letter in the mail two months in advance. If you don't receive a letter, please call our office to schedule the follow-up appointment.  Any Other Special Instructions Will Be Listed Below (If Applicable).  CPAP supplies have been ordered. [facility] should get [facility] call from [facility]. If you don't get a call within a week call Gae Bon, CPAP assistant at [phone].    If you need a refill on your cardiac medications before your next appointment, please call your pharmacy.

## 2017-12-18 ENCOUNTER — Telehealth: Payer: Self-pay | Admitting: *Deleted

## 2017-12-18 NOTE — Telephone Encounter (Signed)
Supplies order sent to [facility] via community message.

## 2017-12-18 NOTE — Telephone Encounter (Deleted)
-----   Message from [place], [place] sent at 12/10/2017 12:06 PM EST ----- Regarding: dme order DME order for cpap supplies from [facility]

## 2017-12-18 NOTE — Telephone Encounter (Signed)
-----   Message from [place], [place] sent at 12/10/2017 12:06 PM EST ----- Regarding: dme order DME order for cpap supplies from [facility]

## 2017-12-30 ENCOUNTER — Ambulatory Visit: Payer: Medicare Other | Admitting: Cardiology

## 2018-01-17 DIAGNOSIS — L308 Other specified dermatitis: Secondary | ICD-10-CM | POA: Diagnosis not present

## 2018-01-17 DIAGNOSIS — D692 Other nonthrombocytopenic purpura: Secondary | ICD-10-CM | POA: Diagnosis not present

## 2018-01-17 DIAGNOSIS — Z85828 Personal history of other malignant neoplasm of skin: Secondary | ICD-10-CM | POA: Diagnosis not present

## 2018-01-24 DIAGNOSIS — Z85828 Personal history of other malignant neoplasm of skin: Secondary | ICD-10-CM | POA: Diagnosis not present

## 2018-01-24 DIAGNOSIS — D692 Other nonthrombocytopenic purpura: Secondary | ICD-10-CM | POA: Diagnosis not present

## 2018-01-24 DIAGNOSIS — L308 Other specified dermatitis: Secondary | ICD-10-CM | POA: Diagnosis not present

## 2018-02-14 DIAGNOSIS — L820 Inflamed seborrheic keratosis: Secondary | ICD-10-CM | POA: Diagnosis not present

## 2018-02-14 DIAGNOSIS — Z85828 Personal history of other malignant neoplasm of skin: Secondary | ICD-10-CM | POA: Diagnosis not present

## 2018-02-14 DIAGNOSIS — D1801 Hemangioma of skin and subcutaneous tissue: Secondary | ICD-10-CM | POA: Diagnosis not present

## 2018-02-14 DIAGNOSIS — L853 Xerosis cutis: Secondary | ICD-10-CM | POA: Diagnosis not present

## 2018-02-14 DIAGNOSIS — L821 Other seborrheic keratosis: Secondary | ICD-10-CM | POA: Diagnosis not present

## 2018-02-14 DIAGNOSIS — L570 Actinic keratosis: Secondary | ICD-10-CM | POA: Diagnosis not present

## 2018-02-14 DIAGNOSIS — L304 Erythema intertrigo: Secondary | ICD-10-CM | POA: Diagnosis not present

## 2018-02-14 DIAGNOSIS — D692 Other nonthrombocytopenic purpura: Secondary | ICD-10-CM | POA: Diagnosis not present

## 2018-03-03 DIAGNOSIS — H401122 Primary open-angle glaucoma, left eye, moderate stage: Secondary | ICD-10-CM | POA: Diagnosis not present

## 2018-03-28 NOTE — Telephone Encounter (Signed)
Error opening  

## 2018-04-24 DIAGNOSIS — R197 Diarrhea, unspecified: Secondary | ICD-10-CM | POA: Diagnosis not present

## 2018-04-24 DIAGNOSIS — Z6828 Body mass index (BMI) 28.0-28.9, adult: Secondary | ICD-10-CM | POA: Diagnosis not present

## 2018-04-29 DIAGNOSIS — R972 Elevated prostate specific antigen [PSA]: Secondary | ICD-10-CM | POA: Diagnosis not present

## 2018-04-29 DIAGNOSIS — K5909 Other constipation: Secondary | ICD-10-CM | POA: Diagnosis not present

## 2018-04-29 DIAGNOSIS — H409 Unspecified glaucoma: Secondary | ICD-10-CM | POA: Diagnosis not present

## 2018-04-29 DIAGNOSIS — R413 Other amnesia: Secondary | ICD-10-CM | POA: Diagnosis not present

## 2018-04-29 DIAGNOSIS — F329 Major depressive disorder, single episode, unspecified: Secondary | ICD-10-CM | POA: Diagnosis not present

## 2018-04-29 DIAGNOSIS — Z720 Tobacco use: Secondary | ICD-10-CM | POA: Diagnosis not present

## 2018-04-29 DIAGNOSIS — R197 Diarrhea, unspecified: Secondary | ICD-10-CM | POA: Diagnosis not present

## 2018-04-29 DIAGNOSIS — J449 Chronic obstructive pulmonary disease, unspecified: Secondary | ICD-10-CM | POA: Diagnosis not present

## 2018-04-29 DIAGNOSIS — E663 Overweight: Secondary | ICD-10-CM | POA: Diagnosis not present

## 2018-04-29 DIAGNOSIS — N401 Enlarged prostate with lower urinary tract symptoms: Secondary | ICD-10-CM | POA: Diagnosis not present

## 2018-04-29 DIAGNOSIS — G4739 Other sleep apnea: Secondary | ICD-10-CM | POA: Diagnosis not present

## 2018-05-06 DIAGNOSIS — Z6827 Body mass index (BMI) 27.0-27.9, adult: Secondary | ICD-10-CM | POA: Diagnosis not present

## 2018-05-06 DIAGNOSIS — R630 Anorexia: Secondary | ICD-10-CM | POA: Diagnosis not present

## 2018-05-06 DIAGNOSIS — E860 Dehydration: Secondary | ICD-10-CM | POA: Diagnosis not present

## 2018-05-06 DIAGNOSIS — R197 Diarrhea, unspecified: Secondary | ICD-10-CM | POA: Diagnosis not present

## 2018-05-06 DIAGNOSIS — E876 Hypokalemia: Secondary | ICD-10-CM | POA: Diagnosis not present

## 2018-05-07 ENCOUNTER — Other Ambulatory Visit: Payer: Self-pay | Admitting: Internal Medicine

## 2018-05-07 ENCOUNTER — Ambulatory Visit
Admission: RE | Admit: 2018-05-07 | Discharge: 2018-05-07 | Disposition: A | Discharge status: Home / Self Care | Payer: Medicare Other | Source: Ambulatory Visit | Attending: Internal Medicine | Admitting: Internal Medicine

## 2018-05-07 DIAGNOSIS — R634 Abnormal weight loss: Secondary | ICD-10-CM

## 2018-05-07 DIAGNOSIS — R197 Diarrhea, unspecified: Secondary | ICD-10-CM

## 2018-05-07 DIAGNOSIS — R109 Unspecified abdominal pain: Secondary | ICD-10-CM

## 2018-05-07 DIAGNOSIS — N200 Calculus of kidney: Secondary | ICD-10-CM | POA: Diagnosis not present

## 2018-05-07 DIAGNOSIS — K4090 Unilateral inguinal hernia, without obstruction or gangrene, not specified as recurrent: Secondary | ICD-10-CM | POA: Diagnosis not present

## 2018-05-07 MED ORDER — IOPAMIDOL (ISOVUE-300) INJECTION 61%
125.0000 mL | Freq: Once | INTRAVENOUS | Status: AC | PRN
  Administered 2018-05-07: 125 mL via INTRAVENOUS

## 2018-05-07 MED ORDER — IOHEXOL 300 MG/ML  SOLN
30.0000 mL | Freq: Once | INTRAMUSCULAR | Status: AC | PRN
  Administered 2018-05-07: 30 mL via ORAL

## 2018-05-13 DIAGNOSIS — R630 Anorexia: Secondary | ICD-10-CM | POA: Diagnosis not present

## 2018-05-13 DIAGNOSIS — E860 Dehydration: Secondary | ICD-10-CM | POA: Diagnosis not present

## 2018-05-13 DIAGNOSIS — Z6827 Body mass index (BMI) 27.0-27.9, adult: Secondary | ICD-10-CM | POA: Diagnosis not present

## 2018-05-13 DIAGNOSIS — R109 Unspecified abdominal pain: Secondary | ICD-10-CM | POA: Diagnosis not present

## 2018-05-13 DIAGNOSIS — R634 Abnormal weight loss: Secondary | ICD-10-CM | POA: Diagnosis not present

## 2018-05-13 DIAGNOSIS — R197 Diarrhea, unspecified: Secondary | ICD-10-CM | POA: Diagnosis not present

## 2018-05-13 DIAGNOSIS — E876 Hypokalemia: Secondary | ICD-10-CM | POA: Diagnosis not present

## 2018-05-23 DIAGNOSIS — R197 Diarrhea, unspecified: Secondary | ICD-10-CM | POA: Diagnosis not present

## 2018-05-23 DIAGNOSIS — Z86010 Personal history of colonic polyps: Secondary | ICD-10-CM | POA: Diagnosis not present

## 2018-06-09 DIAGNOSIS — K648 Other hemorrhoids: Secondary | ICD-10-CM | POA: Insufficient documentation

## 2018-06-09 DIAGNOSIS — Z86010 Personal history of colonic polyps: Secondary | ICD-10-CM | POA: Insufficient documentation

## 2018-06-09 DIAGNOSIS — Z860101 Personal history of adenomatous and serrated colon polyps: Secondary | ICD-10-CM | POA: Insufficient documentation

## 2018-06-09 DIAGNOSIS — K5790 Diverticulosis of intestine, part unspecified, without perforation or abscess without bleeding: Secondary | ICD-10-CM | POA: Insufficient documentation

## 2018-06-10 DIAGNOSIS — K52831 Collagenous colitis: Secondary | ICD-10-CM | POA: Diagnosis not present

## 2018-06-10 DIAGNOSIS — R197 Diarrhea, unspecified: Secondary | ICD-10-CM | POA: Diagnosis not present

## 2018-06-10 DIAGNOSIS — Z86010 Personal history of colonic polyps: Secondary | ICD-10-CM | POA: Diagnosis not present

## 2018-06-10 DIAGNOSIS — K5730 Diverticulosis of large intestine without perforation or abscess without bleeding: Secondary | ICD-10-CM | POA: Diagnosis not present

## 2018-06-10 DIAGNOSIS — Z1211 Encounter for screening for malignant neoplasm of colon: Secondary | ICD-10-CM | POA: Diagnosis not present

## 2018-06-10 DIAGNOSIS — K648 Other hemorrhoids: Secondary | ICD-10-CM | POA: Diagnosis not present

## 2018-07-23 DIAGNOSIS — K52831 Collagenous colitis: Secondary | ICD-10-CM | POA: Diagnosis not present

## 2018-09-04 DIAGNOSIS — H401122 Primary open-angle glaucoma, left eye, moderate stage: Secondary | ICD-10-CM | POA: Diagnosis not present

## 2018-10-03 DIAGNOSIS — Z23 Encounter for immunization: Secondary | ICD-10-CM | POA: Diagnosis not present

## 2018-11-21 DIAGNOSIS — Z79899 Other long term (current) drug therapy: Secondary | ICD-10-CM | POA: Diagnosis not present

## 2018-11-21 DIAGNOSIS — Z125 Encounter for screening for malignant neoplasm of prostate: Secondary | ICD-10-CM | POA: Diagnosis not present

## 2018-11-21 DIAGNOSIS — R82998 Other abnormal findings in urine: Secondary | ICD-10-CM | POA: Diagnosis not present

## 2018-11-24 DIAGNOSIS — R972 Elevated prostate specific antigen [PSA]: Secondary | ICD-10-CM | POA: Diagnosis not present

## 2018-11-24 DIAGNOSIS — G4739 Other sleep apnea: Secondary | ICD-10-CM | POA: Diagnosis not present

## 2018-11-24 DIAGNOSIS — Z1389 Encounter for screening for other disorder: Secondary | ICD-10-CM | POA: Diagnosis not present

## 2018-11-24 DIAGNOSIS — Z0000 Encounter for general adult medical examination without abnormal findings: Secondary | ICD-10-CM | POA: Diagnosis not present

## 2018-11-24 DIAGNOSIS — K5909 Other constipation: Secondary | ICD-10-CM | POA: Diagnosis not present

## 2018-11-24 DIAGNOSIS — B359 Dermatophytosis, unspecified: Secondary | ICD-10-CM | POA: Diagnosis not present

## 2018-11-24 DIAGNOSIS — D751 Secondary polycythemia: Secondary | ICD-10-CM | POA: Diagnosis not present

## 2018-11-24 DIAGNOSIS — J449 Chronic obstructive pulmonary disease, unspecified: Secondary | ICD-10-CM | POA: Diagnosis not present

## 2018-11-24 DIAGNOSIS — M1990 Unspecified osteoarthritis, unspecified site: Secondary | ICD-10-CM | POA: Diagnosis not present

## 2018-11-24 DIAGNOSIS — N401 Enlarged prostate with lower urinary tract symptoms: Secondary | ICD-10-CM | POA: Diagnosis not present

## 2018-11-24 DIAGNOSIS — R413 Other amnesia: Secondary | ICD-10-CM | POA: Diagnosis not present

## 2018-12-09 DIAGNOSIS — K52831 Collagenous colitis: Secondary | ICD-10-CM | POA: Diagnosis not present

## 2018-12-25 DIAGNOSIS — N400 Enlarged prostate without lower urinary tract symptoms: Secondary | ICD-10-CM | POA: Diagnosis not present

## 2018-12-25 DIAGNOSIS — R972 Elevated prostate specific antigen [PSA]: Secondary | ICD-10-CM | POA: Diagnosis not present

## 2019-01-04 DIAGNOSIS — R972 Elevated prostate specific antigen [PSA]: Secondary | ICD-10-CM | POA: Insufficient documentation

## 2019-01-05 DIAGNOSIS — F17210 Nicotine dependence, cigarettes, uncomplicated: Secondary | ICD-10-CM | POA: Diagnosis not present

## 2019-01-05 DIAGNOSIS — R972 Elevated prostate specific antigen [PSA]: Secondary | ICD-10-CM | POA: Diagnosis not present

## 2019-01-06 DIAGNOSIS — C61 Malignant neoplasm of prostate: Secondary | ICD-10-CM | POA: Diagnosis not present

## 2019-01-19 NOTE — Progress Notes (Signed)
Cardiology Office Note:    Date:  01/20/2019   ID:  Wayne Walls, DOB 06-02-1933, MRN [id number]  PCP:  Burnard Bunting, MD  Cardiologist:  No primary care provider on file.    Referring MD: Burnard Bunting, MD   Chief Complaint  Patient presents with  . Sleep Apnea    History of Present Illness:    Wayne Walls is a 85 y.o. male with a hx of OSA on CPAP.  he is doing well with his CPAP device and thinks that he has gotten used to it.  He tolerates the mask and feels the pressure is adequate.  Since going on CPAP he feels rested in the am and has no significant daytime sleepiness.  he denies any significant mouth or nasal dryness or nasal congestion.  He does not think that he snores.     Past Medical History:  Diagnosis Date  . COPD (chronic obstructive pulmonary disease) ([place])   . Depression   . Osteoarthrosis, unspecified whether generalized or localized, unspecified site   . Syncope    Carotids, cardiology  . Tobacco use   . Unspecified glaucoma(365.9)   . Unspecified sleep apnea    Chronic    Past Surgical History:  Procedure Laterality Date  . HERNIA REPAIR      Current Medications: Current Meds  Medication Sig  . aspirin 81 MG tablet Take 81 mg by mouth every evening.   . cholecalciferol (VITAMIN D) 1000 UNITS tablet Take 2,000 Units by mouth daily.   Marland Kitchen donepezil (ARICEPT) 5 MG tablet Take 5 mg by mouth at bedtime.  . hydrocortisone 2.5 % cream Apply 1 application topically daily.  Marland Kitchen ketoconazole (NIZORAL) 2 % cream Apply 1 application topically daily as needed.  . latanoprost (XALATAN) 0.005 % ophthalmic solution Place 1 drop into the left eye at bedtime. As directed  . polyethylene glycol (MIRALAX) packet Take 17 g by mouth daily.     Allergies:   Patient has no known allergies.   Social History   Socioeconomic History  . Marital status: Married    Spouse name: Not on file  . Number of children: Not on file  . Years of education: Not on file    . Highest education level: Not on file  Occupational History  . Not on file  Social Needs  . Financial resource strain: Not on file  . Food insecurity:    Worry: Not on file    Inability: Not on file  . Transportation needs:    Medical: Not on file    Non-medical: Not on file  Tobacco Use  . Smoking status: Current Every Day Smoker  . Smokeless tobacco: Never Used  Substance and Sexual Activity  . Alcohol use: Yes  . Drug use: Not on file  . Sexual activity: Not on file  Lifestyle  . Physical activity:    Days per week: Not on file    Minutes per session: Not on file  . Stress: Not on file  Relationships  . Social connections:    Talks on phone: Not on file    Gets together: Not on file    Attends religious service: Not on file    Active member of club or organization: Not on file    Attends meetings of clubs or organizations: Not on file    Relationship status: Not on file  Other Topics Concern  . Not on file  Social History Narrative  . Not on file  Family History: The patient's family history includes Heart disease in his mother; Hypertension in his mother.  ROS:   Please see the history of present illness.    ROS  All other systems reviewed and negative.   EKGs/Labs/Other Studies Reviewed:    The following studies were reviewed today: PAP download  EKG:  EKG is not ordered today.   Recent Labs: No results found for requested labs within last 8760 hours.   Recent Lipid Panel No results found for: CHOL, TRIG, HDL, CHOLHDL, VLDL, LDLCALC, LDLDIRECT  Physical Exam:    VS:  Ht 6\' 1"  (1.854 m)   BMI 28.47 kg/m     Wt Readings from Last 3 Encounters:  12/10/17 215 lb 12.8 oz (97.9 kg)  10/15/17 217 lb (98.4 kg)  12/10/16 212 lb 12.8 oz (96.5 kg)     GEN:  Well nourished, well developed in no acute distress HEENT: Normal NECK: No JVD; No carotid bruits LYMPHATICS: No lymphadenopathy CARDIAC: RRR, no murmurs, rubs, gallops RESPIRATORY:  Clear  to auscultation without rales, wheezing or rhonchi  ABDOMEN: Soft, non-tender, non-distended MUSCULOSKELETAL:  No edema; No deformity  SKIN: Warm and dry NEUROLOGIC:  Alert and oriented x 3 PSYCHIATRIC:  Normal affect   ASSESSMENT:    1. OSA (obstructive sleep apnea)    PLAN:    In order of problems listed above:  1.  OSA - the patient is tolerating PAP therapy well without any problems. The PAP download was reviewed today and showed an AHI of 0.7/hr on 11 cm H2O with 97% compliance in using more than 4 hours nightly.  The patient has been using and benefiting from PAP use and will continue to benefit from therapy.      Medication Adjustments/Labs and Tests Ordered: Current medicines are reviewed at length with the patient today.  Concerns regarding medicines are outlined above.  No orders of the defined types were placed in this encounter.  No orders of the defined types were placed in this encounter.   Signed, Fransico Him, MD  01/20/2019 2:44 PM    [place]

## 2019-01-20 ENCOUNTER — Encounter: Payer: Self-pay | Admitting: Cardiology

## 2019-01-20 ENCOUNTER — Ambulatory Visit (INDEPENDENT_AMBULATORY_CARE_PROVIDER_SITE_OTHER): Payer: Medicare Other | Admitting: Cardiology

## 2019-01-20 ENCOUNTER — Encounter (INDEPENDENT_AMBULATORY_CARE_PROVIDER_SITE_OTHER): Payer: Self-pay

## 2019-01-20 VITALS — BP 132/84 | HR 74 | Ht 73.0 in | Wt 199.4 lb

## 2019-01-20 DIAGNOSIS — G4733 Obstructive sleep apnea (adult) (pediatric): Secondary | ICD-10-CM | POA: Diagnosis not present

## 2019-01-20 NOTE — Patient Instructions (Signed)
Medication Instructions:  Your provider recommends that you continue on your current medications as directed. Please refer to the Current Medication list given to you today.    Labwork: None  Testing/Procedures: None  Follow-Up: Your provider wants you to follow-up in: 1 year with Dr. Turner. You will receive a reminder letter in the mail two months in advance. If you don't receive a letter, please call our office to schedule the follow-up appointment.   

## 2019-01-21 DIAGNOSIS — R410 Disorientation, unspecified: Secondary | ICD-10-CM | POA: Diagnosis not present

## 2019-01-21 DIAGNOSIS — C61 Malignant neoplasm of prostate: Secondary | ICD-10-CM | POA: Diagnosis not present

## 2019-01-21 DIAGNOSIS — Z6826 Body mass index (BMI) 26.0-26.9, adult: Secondary | ICD-10-CM | POA: Diagnosis not present

## 2019-01-26 DIAGNOSIS — C61 Malignant neoplasm of prostate: Secondary | ICD-10-CM | POA: Insufficient documentation

## 2019-01-26 NOTE — Progress Notes (Signed)
GU Location of Tumor / Histology: prostatic adenocarcinoma  If Prostate Cancer, Gleason Score is (3 + 4) and PSA is (15.376). Prostate volume: 76.55 grams.  10/23/2017 PSA 12.172 10/17/2016 PSA 9.258 10/17/2015 PSA 10.087 10/13/2014 PSA 9.895   Biopsies of prostate (if applicable) revealed:  A. PROSTATE, RM, NEEDLE BIOPSY:    Benign prostate tissue with atrophy.    Mild chronic inflammation.  B. PROSTATE, RA, NEEDLE BIOPSY:    Benign prostate tissue with atrophy.  C. PROSTATE, RB, NEEDLE BIOPSY:    Adenocarcinoma, Gleason score 3+4=7 (Grade Group 2),      involving 2 cores and approximately 20% of submitted      tissue.  D. PROSTATE, LM, NEEDLE BIOPSY:    Benign prostate tissue.    Focal chronic inflammation.  E. PROSTATE, LA, NEEDLE BIOPSY:    High-grade prostatic intraepithelial neoplasia.  F. PROSTATE, LB, NEEDLE BIOPSY:    Benign prostate tissue with focal atrophy.  COMMENT: [facility]   Report Prepared By: Curt Bears. Orland Mustard, M.D.  Past/Anticipated interventions by urology, if any: prostate biopsy, CT abdomen/pelvis  Past/Anticipated interventions by medical oncology, if any: no  Weight changes, if any: Down 20 lb in the last 7 months due to episode of colitis.   Bowel/Bladder complaints, if any: Reports a steady urine stream. Reports nocturia x 1. Denies dysuria. Reports hematuria. Reports occasional urinary leakage. IPSS 20. SHIM 5.   Nausea/Vomiting, if any: no  Pain issues, if any:  no  SAFETY ISSUES:  Prior radiation? no  Pacemaker/ICD? no  Possible current pregnancy? no, male patient  Is the patient on methotrexate? no  Current Complaints / other details:  85 year old male. Married. Resides in [place] with second wife. Has two children from previous marriage but only one lives close [place]). Patient suffers from dementia. Reports sister had breast ca.

## 2019-01-27 ENCOUNTER — Ambulatory Visit
Admission: RE | Admit: 2019-01-27 | Discharge: 2019-01-27 | Disposition: A | Discharge status: Home / Self Care | Payer: Medicare Other | Source: Ambulatory Visit | Attending: Radiation Oncology | Admitting: Radiation Oncology

## 2019-01-27 ENCOUNTER — Other Ambulatory Visit: Payer: Self-pay

## 2019-01-27 ENCOUNTER — Encounter: Payer: Self-pay | Admitting: Medical Oncology

## 2019-01-27 ENCOUNTER — Telehealth: Payer: Self-pay | Admitting: Radiation Oncology

## 2019-01-27 ENCOUNTER — Encounter: Payer: Self-pay | Admitting: Radiation Oncology

## 2019-01-27 DIAGNOSIS — J449 Chronic obstructive pulmonary disease, unspecified: Secondary | ICD-10-CM | POA: Insufficient documentation

## 2019-01-27 DIAGNOSIS — Z79899 Other long term (current) drug therapy: Secondary | ICD-10-CM | POA: Insufficient documentation

## 2019-01-27 DIAGNOSIS — R972 Elevated prostate specific antigen [PSA]: Secondary | ICD-10-CM | POA: Diagnosis not present

## 2019-01-27 DIAGNOSIS — C61 Malignant neoplasm of prostate: Secondary | ICD-10-CM | POA: Insufficient documentation

## 2019-01-27 DIAGNOSIS — Z510 Encounter for antineoplastic radiation therapy: Secondary | ICD-10-CM | POA: Insufficient documentation

## 2019-01-27 DIAGNOSIS — F0390 Unspecified dementia without behavioral disturbance: Secondary | ICD-10-CM | POA: Insufficient documentation

## 2019-01-27 DIAGNOSIS — N4231 Prostatic intraepithelial neoplasia: Secondary | ICD-10-CM | POA: Diagnosis not present

## 2019-01-27 DIAGNOSIS — M1990 Unspecified osteoarthritis, unspecified site: Secondary | ICD-10-CM | POA: Insufficient documentation

## 2019-01-27 HISTORY — DX: Malignant neoplasm of prostate: C61

## 2019-01-27 HISTORY — DX: Unspecified dementia, unspecified severity, without behavioral disturbance, psychotic disturbance, mood disturbance, and anxiety: F03.90

## 2019-01-27 NOTE — Progress Notes (Signed)
See progress note under physician encounter. 

## 2019-01-27 NOTE — Progress Notes (Signed)
Introduced myself to Wayne Walls and his wife as the prostate nurse navigator and my role. He is here to discuss radiation treatment options for his prostate cancer. His wife states he suffers with dementia and she will need to share treatment information with his children before making any decisions.   She asked if his children have questions can they call Ashlyn or me? I told her we are happy to answer questions or address any concerns. I gave her my business card.

## 2019-01-27 NOTE — Telephone Encounter (Signed)
Patient hasn't shown from 1030 appointment. Phoned to inquire. No answer. Left voicemail message requesting return call to reschedule.   Both lobbies checked to confirm patient isn't present.

## 2019-01-27 NOTE — Progress Notes (Signed)
Radiation Oncology         [phone] ________________________________  Initial Outpatient Consultation  Name: Wayne Walls MRN: [id number]  Date: 01/27/2019  DOB: 1933/02/07  ON:GEXBMWU, Delfino Lovett, MD  Myrlene Broker, MD   REFERRING PHYSICIAN: Myrlene Broker, MD  DIAGNOSIS: 85 y.o. gentleman with Stage T1c adenocarcinoma of the prostate with Gleason score of 3+4, and PSA of 15.38.    ICD-10-CM   1. Malignant neoplasm of prostate ([place]) C61     HISTORY OF PRESENT ILLNESS: Wayne Walls is an 85 y.o. male with a diagnosis of prostate cancer. He has a history of elevated PSA which has been gradually rising over the past several years.  He was noted to have an elevated PSA of 15.376 on 11/22/19 with his primary care physician, Dr. Burnard Bunting.  Previous PSA was 12.17 on 10/23/2017 and 9.25 on 10/17/2016.  Accordingly, he was referred for evaluation in urology to Dr. Tresa Endo on 12/25/2018, and a digital rectal examination was performed at that time revealing a symmetric, non-nodular prostate.  The patient proceeded to transrectal ultrasound with 15 biopsies of the prostate on 01/06/2019, performed by Dr. Rosana Hoes.  The prostate volume measured 76.55 cc.  Out of 15 core biopsies, 2 were positive.  The maximum Gleason score was 3+4, and this was seen in the right base and right base lateral.  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  PSA History: 11/2018: PSA 15.376 10/2017: PSA 12.172 10/2016: PSA 9.258 10/2015: PSA 10.087 10/2014: PSA 9.895  The patient reviewed the biopsy results with his urologist and he has kindly been referred today for discussion of potential radiation treatment options. Of note, the patient has progressive dementia.  His wife, who serves as his primary caregiver, accompanies him at today's visit.  PREVIOUS RADIATION THERAPY: No  PAST MEDICAL HISTORY:  Past Medical History:  Diagnosis Date  . COPD  (chronic obstructive pulmonary disease) ([place])   . Dementia ([place])   . Depression   . Osteoarthrosis, unspecified whether generalized or localized, unspecified site   . Prostate cancer ([place])   . Syncope    Carotids, cardiology  . Unspecified glaucoma(365.9)   . Unspecified sleep apnea    Chronic      PAST SURGICAL HISTORY: Past Surgical History:  Procedure Laterality Date  . HERNIA REPAIR      FAMILY HISTORY:  Family History  Problem Relation Age of Onset  . Heart disease Mother   . Hypertension Mother   . Breast cancer Sister   . Prostate cancer Neg Hx   . Colon cancer Neg Hx   . Pancreatic cancer Neg Hx     SOCIAL HISTORY: He is married and lives in [place] with his second wife.  He has 2 children from a previous marriage but only one is local in [place]. Social History   Socioeconomic History  . Marital status: Married    Spouse name: Not on file  . Number of children: 2  . Years of education: Not on file  . Highest education level: Not on file  Occupational History    Comment: retired  Scientific laboratory technician  . Financial resource strain: Not on file  . Food insecurity:    Worry: Not on file    Inability: Not on file  . Transportation needs:    Medical: Not on file    Non-medical: Not on file  Tobacco Use  . Smoking status: Never Smoker  . Smokeless  tobacco: Never Used  Substance and Sexual Activity  . Alcohol use: Not Currently  . Drug use: Never  . Sexual activity: Not Currently  Lifestyle  . Physical activity:    Days per week: Not on file    Minutes per session: Not on file  . Stress: Not on file  Relationships  . Social connections:    Talks on phone: Not on file    Gets together: Not on file    Attends religious service: Not on file    Active member of club or organization: Not on file    Attends meetings of clubs or organizations: Not on file    Relationship status: Not on file  . Intimate partner violence:    Fear of current or ex partner:  Not on file    Emotionally abused: Not on file    Physically abused: Not on file    Forced sexual activity: Not on file  Other Topics Concern  . Not on file  Social History Narrative   Second marriage. Has two grown children. One child resides in [place].     ALLERGIES: Patient has no known allergies.  MEDICATIONS:  Current Outpatient Medications  Medication Sig Dispense Refill  . aspirin 81 MG tablet Take 81 mg by mouth every evening.     . budesonide (ENTOCORT EC) 3 MG 24 hr capsule     . cholecalciferol (VITAMIN D) 1000 UNITS tablet Take 2,000 Units by mouth daily.     Marland Kitchen donepezil (ARICEPT) 5 MG tablet Take 5 mg by mouth at bedtime.  3  . hydrocortisone 2.5 % cream Apply 1 application topically daily.    Marland Kitchen ibuprofen (ADVIL,MOTRIN) 600 MG tablet TK 1 T PO Q 6 TO 8 H PRN P    . ketoconazole (NIZORAL) 2 % cream Apply 1 application topically daily as needed.    . Lactobacillus Rhamnosus, GG, (CULTURELLE) CAPS Take by mouth.    . latanoprost (XALATAN) 0.005 % ophthalmic solution Place 1 drop into the left eye at bedtime. As directed    . polyethylene glycol (MIRALAX) packet Take 17 g by mouth daily. 14 each 0  . potassium chloride (K-DUR) 10 MEQ tablet TK 1 T PO D     No current facility-administered medications for this encounter.     REVIEW OF SYSTEMS:  On review of systems, the patient reports that he is doing well overall. He denies any chest pain, shortness of breath, cough, fevers, chills, or night sweats.  He has had a 20 lb unintended weight loss over the past 7 months which he associates with intermittent episodes of colitis following recent colonoscopy. He reports that his diarrhea is managed medically and he currently denies abdominal pain, nausea, vomiting, diarrhea or constipation. He denies any new musculoskeletal or joint aches or pains. His IPSS was 20, indicating severe urinary symptoms. His SHIM was 5, indicating he has severe erectile dysfunction. A complete review  of systems is obtained and is otherwise negative.  PHYSICAL EXAM:  Wt Readings from Last 3 Encounters:  01/27/19 199 lb 12.8 oz (90.6 kg)  01/20/19 199 lb 6.4 oz (90.4 kg)  12/10/17 215 lb 12.8 oz (97.9 kg)   Temp Readings from Last 3 Encounters:  01/27/19 97.9 F (36.6 C) (Oral)  10/15/17 98.7 F (37.1 C) (Oral)  06/08/16 97.9 F (36.6 C) (Oral)   BP Readings from Last 3 Encounters:  01/27/19 123/68  01/20/19 132/84  12/10/17 130/74   Pulse Readings from Last 3 Encounters:  01/27/19 63  01/20/19 74  12/10/17 78   Pain Assessment Pain Score: 0-No pain/10  In general this is a well appearing Caucasian male in no acute distress. He is alert and oriented x4 and appropriate throughout the examination. HEENT reveals that the patient is normocephalic, atraumatic. EOMs are intact. PERRLA. Skin is intact without any evidence of gross lesions. Cardiovascular exam reveals a regular rate and rhythm, no clicks rubs or murmurs are auscultated. Chest is clear to auscultation bilaterally. Lymphatic assessment is performed and does not reveal any adenopathy in the cervical, supraclavicular, axillary, or inguinal chains. Abdomen has active bowel sounds in all quadrants and is intact. The abdomen is soft, non tender, non distended. Lower extremities are negative for pretibial pitting edema, deep calf tenderness, cyanosis or clubbing.   KPS = 80  100 - Normal; no complaints; no evidence of disease. 90   - Able to carry on normal activity; minor signs or symptoms of disease. 80   - Normal activity with effort; some signs or symptoms of disease. 47   - Cares for self; unable to carry on normal activity or to do active work. 60   - Requires occasional assistance, but is able to care for most of his personal needs. 50   - Requires considerable assistance and frequent medical care. 69   - Disabled; requires special care and assistance. 73   - Severely disabled; hospital admission is indicated  although death not imminent. 82   - Very sick; hospital admission necessary; active supportive treatment necessary. 10   - Moribund; fatal processes progressing rapidly. 0     - Dead  Karnofsky DA, Abelmann [place], Craver LS and Burchenal [facility] [phone]) The use of the nitrogen mustards in the palliative treatment of carcinoma: with particular reference to bronchogenic carcinoma Cancer 1 [id number]  LABORATORY DATA:  Lab Results  Component Value Date   WBC 10.2 10/15/2017   HGB 18.5 (H) 10/15/2017   HCT 53.2 (H) 10/15/2017   MCV 100.6 (H) 10/15/2017   PLT 140 10/15/2017   Lab Results  Component Value Date   NA 137 06/08/2016   K 4.0 06/08/2016   CL 108 06/08/2016   CO2 22 06/08/2016   Lab Results  Component Value Date   ALT 17 06/08/2016   AST 22 06/08/2016   ALKPHOS 94 06/08/2016   BILITOT 1.2 06/08/2016     RADIOGRAPHY: No results found.    IMPRESSION/PLAN: 1. 85 y.o. gentleman with Stage T1c adenocarcinoma of the prostate with Gleason Score of 3+4, and PSA of 15.38. We discussed the patient's workup and outlined the nature of prostate cancer in this setting. The patient's T stage, Gleason's score, and PSA put him into the unfavorable intermediate risk group. Accordingly, he is eligible for a variety of potential treatment options including brachytherapy, 5.5 weeks of external radiation or prostatectomy. Based on his age, he is not a surgical candidate.  We discussed the available radiation techniques, and focused on the details and logistics and delivery. The patient is not an ideal candidate for brachytherapy with a prostate volume of 77 gm and significant LUTS prior to treatment and we do not feel that he would tolerate ADT to downsize the prostate prior to brachytherapy.  We also discussed the option of a more palliative approach to management of his prostate cancer utilizing long-term androgen deprivation therapy but feel that the side effects associated with treatment would have a  significant negative impact on his quality of life. Therefore, we would  recommend proceeding with a 5.5 week course of daily prostate IMRT.  We discussed and outlined the risks, benefits, short and long-term effects associated with radiotherapy and compared and contrasted these with prostatectomy.   At the end of the conversation the patient is interested in moving forward with a 5-1/2-week course of daily prostate IMRT.  We will share this information with his urologist, Dr. Rosana Hoes and move forward with treatment planning accordingly. He has freely signed written consent to proceed today in the office and a copy of this document has been placed in his medical record.  He is tentatively scheduled for CT simulation on Thursday, 01/29/2019 at 3 PM in anticipation of beginning his treatment around 02/09/2019.    We enjoyed meeting the patient and his wife today and look forward to continuing to participate in his care.  We spent 60 minutes face to face with the patient and more than 50% of that time was spent in counseling and/or coordination of care.    Nicholos Johns, PA-C    Tyler Pita, MD  [facility] Direct Dial: [phone]  Fax: [phone] [email or web address]  Skype  [email or web address]  This document serves as a record of services personally performed by Tyler Pita, MD and Freeman Caldron, PA-C. It was created on their behalf by Rae Lips, a trained medical scribe. The creation of this record is based on the scribe's personal observations and the providers' statements to them. This document has been checked and approved by the attending providers.

## 2019-01-29 ENCOUNTER — Ambulatory Visit
Admission: RE | Admit: 2019-01-29 | Discharge: 2019-01-29 | Disposition: A | Discharge status: Home / Self Care | Payer: Medicare Other | Source: Ambulatory Visit | Attending: Radiation Oncology | Admitting: Radiation Oncology

## 2019-01-29 DIAGNOSIS — C61 Malignant neoplasm of prostate: Secondary | ICD-10-CM | POA: Diagnosis not present

## 2019-01-29 DIAGNOSIS — J449 Chronic obstructive pulmonary disease, unspecified: Secondary | ICD-10-CM | POA: Diagnosis not present

## 2019-01-29 DIAGNOSIS — Z79899 Other long term (current) drug therapy: Secondary | ICD-10-CM | POA: Diagnosis not present

## 2019-01-29 DIAGNOSIS — Z510 Encounter for antineoplastic radiation therapy: Secondary | ICD-10-CM | POA: Diagnosis not present

## 2019-01-29 DIAGNOSIS — F0390 Unspecified dementia without behavioral disturbance: Secondary | ICD-10-CM | POA: Diagnosis not present

## 2019-01-29 DIAGNOSIS — M1990 Unspecified osteoarthritis, unspecified site: Secondary | ICD-10-CM | POA: Diagnosis not present

## 2019-02-01 NOTE — Progress Notes (Signed)
  Radiation Oncology         [phone] ________________________________  Name: Wayne Walls MRN: [id number]  Date: 01/29/2019  DOB: 1933-04-29  SIMULATION AND TREATMENT PLANNING NOTE    ICD-10-CM   1. Malignant neoplasm of prostate ([place]) C61     DIAGNOSIS:  85 y.o. gentleman with Stage T1c adenocarcinoma of the prostate with Gleason score of 3+4, and PSA of 15.38  NARRATIVE:  The patient was brought to the [facility].  Identity was confirmed.  All relevant records and images related to the planned course of therapy were reviewed.  The patient freely provided informed written consent to proceed with treatment after reviewing the details related to the planned course of therapy. The consent form was witnessed and verified by the simulation staff.  Then, the patient was set-up in a stable reproducible supine position for radiation therapy.  A vacuum lock pillow device was custom fabricated to position his legs in a reproducible immobilized position.  Then, I performed a urethrogram under sterile conditions to identify the prostatic apex.  CT images were obtained.  Surface markings were placed.  The CT images were loaded into the planning software.  Then the prostate target and avoidance structures including the rectum, bladder, bowel and hips were contoured.  Treatment planning then occurred.  The radiation prescription was entered and confirmed.  A total of one complex treatment devices was fabricated. I have requested : Intensity Modulated Radiotherapy (IMRT) is medically necessary for this case for the following reason:  Rectal sparing.Marland Kitchen  PLAN:  The patient will receive 70 Gy in 28 fractions.  ________________________________  Sheral Apley Tammi Klippel, M.D.

## 2019-02-03 DIAGNOSIS — C61 Malignant neoplasm of prostate: Secondary | ICD-10-CM | POA: Insufficient documentation

## 2019-02-03 DIAGNOSIS — F0390 Unspecified dementia without behavioral disturbance: Secondary | ICD-10-CM | POA: Insufficient documentation

## 2019-02-03 DIAGNOSIS — J449 Chronic obstructive pulmonary disease, unspecified: Secondary | ICD-10-CM | POA: Diagnosis not present

## 2019-02-03 DIAGNOSIS — Z510 Encounter for antineoplastic radiation therapy: Secondary | ICD-10-CM | POA: Insufficient documentation

## 2019-02-03 DIAGNOSIS — Z79899 Other long term (current) drug therapy: Secondary | ICD-10-CM | POA: Insufficient documentation

## 2019-02-03 DIAGNOSIS — M1990 Unspecified osteoarthritis, unspecified site: Secondary | ICD-10-CM | POA: Insufficient documentation

## 2019-02-09 ENCOUNTER — Ambulatory Visit
Admission: RE | Admit: 2019-02-09 | Discharge: 2019-02-09 | Disposition: A | Discharge status: Home / Self Care | Payer: Medicare Other | Source: Ambulatory Visit | Attending: Radiation Oncology | Admitting: Radiation Oncology

## 2019-02-09 DIAGNOSIS — Z510 Encounter for antineoplastic radiation therapy: Secondary | ICD-10-CM | POA: Diagnosis not present

## 2019-02-09 DIAGNOSIS — F0390 Unspecified dementia without behavioral disturbance: Secondary | ICD-10-CM | POA: Diagnosis not present

## 2019-02-09 DIAGNOSIS — M1990 Unspecified osteoarthritis, unspecified site: Secondary | ICD-10-CM | POA: Diagnosis not present

## 2019-02-09 DIAGNOSIS — J449 Chronic obstructive pulmonary disease, unspecified: Secondary | ICD-10-CM | POA: Diagnosis not present

## 2019-02-09 DIAGNOSIS — Z79899 Other long term (current) drug therapy: Secondary | ICD-10-CM | POA: Diagnosis not present

## 2019-02-09 DIAGNOSIS — C61 Malignant neoplasm of prostate: Secondary | ICD-10-CM | POA: Diagnosis not present

## 2019-02-10 ENCOUNTER — Ambulatory Visit
Admission: RE | Admit: 2019-02-10 | Discharge: 2019-02-10 | Disposition: A | Discharge status: Home / Self Care | Payer: Medicare Other | Source: Ambulatory Visit | Attending: Radiation Oncology | Admitting: Radiation Oncology

## 2019-02-10 DIAGNOSIS — M1990 Unspecified osteoarthritis, unspecified site: Secondary | ICD-10-CM | POA: Diagnosis not present

## 2019-02-10 DIAGNOSIS — Z510 Encounter for antineoplastic radiation therapy: Secondary | ICD-10-CM | POA: Diagnosis not present

## 2019-02-10 DIAGNOSIS — C61 Malignant neoplasm of prostate: Secondary | ICD-10-CM | POA: Diagnosis not present

## 2019-02-10 DIAGNOSIS — J449 Chronic obstructive pulmonary disease, unspecified: Secondary | ICD-10-CM | POA: Diagnosis not present

## 2019-02-10 DIAGNOSIS — Z79899 Other long term (current) drug therapy: Secondary | ICD-10-CM | POA: Diagnosis not present

## 2019-02-10 DIAGNOSIS — F0390 Unspecified dementia without behavioral disturbance: Secondary | ICD-10-CM | POA: Diagnosis not present

## 2019-02-11 ENCOUNTER — Ambulatory Visit
Admission: RE | Admit: 2019-02-11 | Discharge: 2019-02-11 | Disposition: A | Discharge status: Home / Self Care | Payer: Medicare Other | Source: Ambulatory Visit | Attending: Radiation Oncology | Admitting: Radiation Oncology

## 2019-02-11 ENCOUNTER — Other Ambulatory Visit: Payer: Self-pay

## 2019-02-11 DIAGNOSIS — M1990 Unspecified osteoarthritis, unspecified site: Secondary | ICD-10-CM | POA: Diagnosis not present

## 2019-02-11 DIAGNOSIS — F0390 Unspecified dementia without behavioral disturbance: Secondary | ICD-10-CM | POA: Diagnosis not present

## 2019-02-11 DIAGNOSIS — Z510 Encounter for antineoplastic radiation therapy: Secondary | ICD-10-CM | POA: Diagnosis not present

## 2019-02-11 DIAGNOSIS — Z79899 Other long term (current) drug therapy: Secondary | ICD-10-CM | POA: Diagnosis not present

## 2019-02-11 DIAGNOSIS — C61 Malignant neoplasm of prostate: Secondary | ICD-10-CM | POA: Diagnosis not present

## 2019-02-11 DIAGNOSIS — J449 Chronic obstructive pulmonary disease, unspecified: Secondary | ICD-10-CM | POA: Diagnosis not present

## 2019-02-12 ENCOUNTER — Ambulatory Visit
Admission: RE | Admit: 2019-02-12 | Discharge: 2019-02-12 | Disposition: A | Discharge status: Home / Self Care | Payer: Medicare Other | Source: Ambulatory Visit | Attending: Radiation Oncology | Admitting: Radiation Oncology

## 2019-02-12 ENCOUNTER — Other Ambulatory Visit: Payer: Self-pay

## 2019-02-12 DIAGNOSIS — J449 Chronic obstructive pulmonary disease, unspecified: Secondary | ICD-10-CM | POA: Diagnosis not present

## 2019-02-12 DIAGNOSIS — Z79899 Other long term (current) drug therapy: Secondary | ICD-10-CM | POA: Diagnosis not present

## 2019-02-12 DIAGNOSIS — C61 Malignant neoplasm of prostate: Secondary | ICD-10-CM | POA: Diagnosis not present

## 2019-02-12 DIAGNOSIS — Z510 Encounter for antineoplastic radiation therapy: Secondary | ICD-10-CM | POA: Diagnosis not present

## 2019-02-12 DIAGNOSIS — M1990 Unspecified osteoarthritis, unspecified site: Secondary | ICD-10-CM | POA: Diagnosis not present

## 2019-02-12 DIAGNOSIS — F0390 Unspecified dementia without behavioral disturbance: Secondary | ICD-10-CM | POA: Diagnosis not present

## 2019-02-13 ENCOUNTER — Ambulatory Visit
Admission: RE | Admit: 2019-02-13 | Discharge: 2019-02-13 | Disposition: A | Discharge status: Home / Self Care | Payer: Medicare Other | Source: Ambulatory Visit | Attending: Radiation Oncology | Admitting: Radiation Oncology

## 2019-02-13 DIAGNOSIS — Z510 Encounter for antineoplastic radiation therapy: Secondary | ICD-10-CM | POA: Diagnosis not present

## 2019-02-13 DIAGNOSIS — F0390 Unspecified dementia without behavioral disturbance: Secondary | ICD-10-CM | POA: Diagnosis not present

## 2019-02-13 DIAGNOSIS — J449 Chronic obstructive pulmonary disease, unspecified: Secondary | ICD-10-CM | POA: Diagnosis not present

## 2019-02-13 DIAGNOSIS — C61 Malignant neoplasm of prostate: Secondary | ICD-10-CM | POA: Diagnosis not present

## 2019-02-13 DIAGNOSIS — Z79899 Other long term (current) drug therapy: Secondary | ICD-10-CM | POA: Diagnosis not present

## 2019-02-13 DIAGNOSIS — M1990 Unspecified osteoarthritis, unspecified site: Secondary | ICD-10-CM | POA: Diagnosis not present

## 2019-02-16 ENCOUNTER — Ambulatory Visit: Payer: Medicare Other

## 2019-02-17 ENCOUNTER — Ambulatory Visit: Payer: Medicare Other

## 2019-02-18 ENCOUNTER — Telehealth: Payer: Self-pay | Admitting: Radiation Oncology

## 2019-02-18 ENCOUNTER — Ambulatory Visit: Payer: Medicare Other

## 2019-02-18 NOTE — Telephone Encounter (Signed)
Per Dr. Tammi Klippel patient and wife may wish to delay radiation treatment in light of CVD 19 viruses for a few weeks. Noted Monday and Tuesday treatments were cancelled. Phoned wife to inquire further. Wife explains her intent to delay her husband's radiation for a couple weeks. This RN committed to phoning Ms. Dolloff again on Tuesday, March 31 to further discuss next steps/resuming treatment. Informed therapist on L1 of this finding.

## 2019-02-19 ENCOUNTER — Ambulatory Visit: Payer: Medicare Other

## 2019-02-20 ENCOUNTER — Ambulatory Visit: Payer: Medicare Other

## 2019-02-23 ENCOUNTER — Ambulatory Visit: Payer: Medicare Other

## 2019-02-24 ENCOUNTER — Ambulatory Visit: Payer: Medicare Other

## 2019-02-25 ENCOUNTER — Ambulatory Visit: Payer: Medicare Other

## 2019-02-26 ENCOUNTER — Ambulatory Visit: Payer: Medicare Other

## 2019-02-27 ENCOUNTER — Ambulatory Visit: Payer: Medicare Other

## 2019-03-02 ENCOUNTER — Ambulatory Visit: Payer: Medicare Other

## 2019-03-03 ENCOUNTER — Ambulatory Visit: Payer: Medicare Other

## 2019-03-04 ENCOUNTER — Ambulatory Visit: Payer: Medicare Other | Attending: Radiation Oncology

## 2019-03-04 DIAGNOSIS — C61 Malignant neoplasm of prostate: Secondary | ICD-10-CM | POA: Insufficient documentation

## 2019-03-04 DIAGNOSIS — Z79899 Other long term (current) drug therapy: Secondary | ICD-10-CM | POA: Insufficient documentation

## 2019-03-04 DIAGNOSIS — F0390 Unspecified dementia without behavioral disturbance: Secondary | ICD-10-CM | POA: Insufficient documentation

## 2019-03-04 DIAGNOSIS — J449 Chronic obstructive pulmonary disease, unspecified: Secondary | ICD-10-CM | POA: Insufficient documentation

## 2019-03-04 DIAGNOSIS — M1990 Unspecified osteoarthritis, unspecified site: Secondary | ICD-10-CM | POA: Insufficient documentation

## 2019-03-04 DIAGNOSIS — Z510 Encounter for antineoplastic radiation therapy: Secondary | ICD-10-CM | POA: Insufficient documentation

## 2019-03-05 ENCOUNTER — Ambulatory Visit: Payer: Medicare Other

## 2019-03-05 ENCOUNTER — Telehealth: Payer: Self-pay | Admitting: Medical Oncology

## 2019-03-05 ENCOUNTER — Telehealth: Payer: Self-pay | Admitting: Radiation Oncology

## 2019-03-05 NOTE — Telephone Encounter (Signed)
Patient decided to postpone treatment. Phoned patient and wife to inquire about current status. Spoke with wife. Wife confirms the patient is doing well but does wish to continue to delay treatment out of fear of being exposed to [place] 19. Made plans to contact patient/wife at the end of April.

## 2019-03-05 NOTE — Telephone Encounter (Signed)
Called [facility]- spoke with Wayne Walls to see if they would be able to give patient Lupron in the home. I explained that Wayne Walls has been receiving radiation but fear to COVID-19 exposure, he has chosen to delay treatment. Dr. Tammi Klippel would like to give him Lupron to cover him during treatment delay is possible. Wayne Walls will speak with nursing and her purchasing manager to see if this is possible and call me back.

## 2019-03-06 ENCOUNTER — Telehealth: Payer: Self-pay | Admitting: Medical Oncology

## 2019-03-06 ENCOUNTER — Ambulatory Visit: Payer: Medicare Other

## 2019-03-06 NOTE — Telephone Encounter (Signed)
Wayne Walls called stating that her purchasing agent is not able to get Lupron. I thanked her for her efforts and Dr. Tammi Klippel notified.

## 2019-03-09 ENCOUNTER — Ambulatory Visit: Payer: Medicare Other

## 2019-03-10 ENCOUNTER — Ambulatory Visit: Payer: Medicare Other

## 2019-03-11 ENCOUNTER — Ambulatory Visit: Payer: Medicare Other

## 2019-03-12 ENCOUNTER — Ambulatory Visit: Payer: Medicare Other

## 2019-03-13 ENCOUNTER — Ambulatory Visit: Payer: Medicare Other

## 2019-03-16 ENCOUNTER — Ambulatory Visit: Payer: Medicare Other

## 2019-03-17 ENCOUNTER — Ambulatory Visit: Payer: Medicare Other

## 2019-03-18 ENCOUNTER — Ambulatory Visit: Payer: Medicare Other

## 2019-03-19 ENCOUNTER — Ambulatory Visit: Payer: Medicare Other

## 2019-03-20 ENCOUNTER — Ambulatory Visit: Payer: Medicare Other

## 2019-03-23 ENCOUNTER — Ambulatory Visit: Payer: Medicare Other

## 2019-03-24 ENCOUNTER — Ambulatory Visit: Payer: Medicare Other

## 2019-03-25 ENCOUNTER — Ambulatory Visit: Payer: Medicare Other

## 2019-03-26 ENCOUNTER — Ambulatory Visit: Payer: Medicare Other

## 2019-03-27 ENCOUNTER — Ambulatory Visit: Payer: Medicare Other

## 2019-03-30 ENCOUNTER — Ambulatory Visit: Payer: Medicare Other

## 2019-03-31 ENCOUNTER — Ambulatory Visit: Payer: Medicare Other

## 2019-03-31 DIAGNOSIS — K52831 Collagenous colitis: Secondary | ICD-10-CM | POA: Diagnosis not present

## 2019-04-01 ENCOUNTER — Ambulatory Visit: Payer: Medicare Other

## 2019-04-02 ENCOUNTER — Ambulatory Visit: Payer: Medicare Other

## 2019-04-03 ENCOUNTER — Ambulatory Visit: Payer: Medicare Other

## 2019-04-04 ENCOUNTER — Ambulatory Visit: Payer: Medicare Other

## 2019-04-06 ENCOUNTER — Ambulatory Visit: Payer: Medicare Other

## 2019-04-06 DIAGNOSIS — Z510 Encounter for antineoplastic radiation therapy: Secondary | ICD-10-CM | POA: Insufficient documentation

## 2019-04-06 DIAGNOSIS — Z79899 Other long term (current) drug therapy: Secondary | ICD-10-CM | POA: Insufficient documentation

## 2019-04-06 DIAGNOSIS — F0390 Unspecified dementia without behavioral disturbance: Secondary | ICD-10-CM | POA: Insufficient documentation

## 2019-04-06 DIAGNOSIS — M1990 Unspecified osteoarthritis, unspecified site: Secondary | ICD-10-CM | POA: Insufficient documentation

## 2019-04-06 DIAGNOSIS — C61 Malignant neoplasm of prostate: Secondary | ICD-10-CM | POA: Insufficient documentation

## 2019-04-06 DIAGNOSIS — J449 Chronic obstructive pulmonary disease, unspecified: Secondary | ICD-10-CM | POA: Insufficient documentation

## 2019-04-07 ENCOUNTER — Ambulatory Visit: Payer: Medicare Other

## 2019-04-08 ENCOUNTER — Ambulatory Visit: Payer: Medicare Other

## 2019-04-09 ENCOUNTER — Ambulatory Visit: Admission: RE | Admit: 2019-04-09 | Payer: Medicare Other | Source: Ambulatory Visit

## 2019-04-10 ENCOUNTER — Ambulatory Visit: Payer: Medicare Other

## 2019-04-13 ENCOUNTER — Ambulatory Visit: Payer: Medicare Other

## 2019-04-14 ENCOUNTER — Ambulatory Visit: Payer: Medicare Other

## 2019-04-15 ENCOUNTER — Ambulatory Visit: Payer: Medicare Other

## 2019-04-16 ENCOUNTER — Ambulatory Visit: Payer: Medicare Other

## 2019-04-17 ENCOUNTER — Ambulatory Visit: Payer: Medicare Other

## 2019-04-20 ENCOUNTER — Ambulatory Visit: Payer: Medicare Other

## 2019-04-21 ENCOUNTER — Ambulatory Visit: Payer: Medicare Other

## 2019-04-22 ENCOUNTER — Telehealth: Payer: Self-pay | Admitting: Radiation Oncology

## 2019-04-22 ENCOUNTER — Ambulatory Visit: Payer: Medicare Other

## 2019-04-22 NOTE — Telephone Encounter (Signed)
Phoned patient's wife to inquire about intentions reference radiation therapy. Wife states, "we are suppose to talk to his oncologist today then I will call you back." She verbalizes, "he really doesn't want to have radiation." Ensured she has a contact number for myself, Ashlyn Bruning, and Cira Rue. Requested she phone one of [place] three to let us know what their decision is after speaking with his oncologist today. The wife verbalized understanding.

## 2019-04-23 ENCOUNTER — Ambulatory Visit: Payer: Medicare Other

## 2019-04-24 ENCOUNTER — Ambulatory Visit: Payer: Medicare Other

## 2019-04-24 ENCOUNTER — Telehealth: Payer: Self-pay | Admitting: Radiation Oncology

## 2019-04-24 NOTE — Telephone Encounter (Signed)
This RN has phoned regularly to inquire about patient status and intentions to resume radiation. Spoke with wife recently even but she was "waiting to talk to the oncologist." Wife phoned today requesting this RN phone her back. Phoned back promptly. Wife is requesting a return call by Dr. Tammi Klippel or his PA, Ashlyn. She verbalizes that they want to know if his tumor has grown any since his week of radiation therapy. She goes onto say,"that answer will determine if we resume radiation." Assured her this RN would relay this message.

## 2019-04-27 ENCOUNTER — Ambulatory Visit: Payer: Medicare Other

## 2019-04-28 ENCOUNTER — Ambulatory Visit: Payer: Medicare Other

## 2019-04-29 ENCOUNTER — Ambulatory Visit: Payer: Medicare Other

## 2019-04-29 DIAGNOSIS — H5202 Hypermetropia, left eye: Secondary | ICD-10-CM | POA: Diagnosis not present

## 2019-04-29 DIAGNOSIS — H401122 Primary open-angle glaucoma, left eye, moderate stage: Secondary | ICD-10-CM | POA: Diagnosis not present

## 2019-04-29 DIAGNOSIS — H401111 Primary open-angle glaucoma, right eye, mild stage: Secondary | ICD-10-CM | POA: Diagnosis not present

## 2019-04-29 DIAGNOSIS — H5211 Myopia, right eye: Secondary | ICD-10-CM | POA: Diagnosis not present

## 2019-04-30 ENCOUNTER — Telehealth: Payer: Self-pay | Admitting: Urology

## 2019-04-30 ENCOUNTER — Ambulatory Visit: Admission: RE | Admit: 2019-04-30 | Payer: Medicare Other | Source: Ambulatory Visit

## 2019-04-30 ENCOUNTER — Telehealth: Payer: Self-pay | Admitting: *Deleted

## 2019-04-30 ENCOUNTER — Other Ambulatory Visit: Payer: Self-pay

## 2019-04-30 ENCOUNTER — Other Ambulatory Visit: Payer: Self-pay | Admitting: Urology

## 2019-04-30 ENCOUNTER — Ambulatory Visit
Admission: RE | Admit: 2019-04-30 | Discharge: 2019-04-30 | Disposition: A | Discharge status: Home / Self Care | Payer: Medicare Other | Source: Ambulatory Visit | Attending: Radiation Oncology | Admitting: Radiation Oncology

## 2019-04-30 DIAGNOSIS — C61 Malignant neoplasm of prostate: Secondary | ICD-10-CM | POA: Diagnosis not present

## 2019-04-30 DIAGNOSIS — J449 Chronic obstructive pulmonary disease, unspecified: Secondary | ICD-10-CM | POA: Diagnosis not present

## 2019-04-30 DIAGNOSIS — Z79899 Other long term (current) drug therapy: Secondary | ICD-10-CM | POA: Diagnosis not present

## 2019-04-30 DIAGNOSIS — Z510 Encounter for antineoplastic radiation therapy: Secondary | ICD-10-CM | POA: Diagnosis not present

## 2019-04-30 DIAGNOSIS — F0390 Unspecified dementia without behavioral disturbance: Secondary | ICD-10-CM | POA: Diagnosis not present

## 2019-04-30 DIAGNOSIS — M1990 Unspecified osteoarthritis, unspecified site: Secondary | ICD-10-CM | POA: Diagnosis not present

## 2019-04-30 NOTE — Telephone Encounter (Signed)
DIAGNOSIS: 85 y.o. gentleman with Stage T1c adenocarcinoma of the prostate with Gleason score of 3+4, and PSA of 15.38.  Patient had initially elected to proceed with a 5.5 week course of prostate IMRT which was started 02/09/19 but discontinued after his 5th treatment on 02/13/19 due to concerns for safety/exposure to COVID-19 during this pandemic.  She has called and spoken with Dr. Rosana Hoes' team regarding the need to resume treatment and inquiring about scans to assess treatment response.  The recommendation was to proceed with radiation treatment as the risk for exposure to COVID-19 at this point is much lower than the risk for disease progression in his cancer is left untreated.  I spoke with his wife, Sunday Spillers, at length this morning regarding resuming radiation treatment which the patient is reluctant to do.  We discussed that aside from repeating a TRUSPBx, there really are not good imaging tests that would tell us if the disease has responded to treatment (unlike given limited XRT dose prior to d/c) or if the cancer is growing.  The recommended test for assessment of prostate cancer progression and/or response to treatment is with a simple PSA blood test.  After thorough discussion, the patient would like to have a PSA test this afternoon, around 1pm if possible, and I will plan to call them with the results as soon as available.  Pending those results, they will give further consideration to resuming treatment if the PSA has increased significantly and if not, they prefer to continue in active surveillance with a repeat PSA with Dr. Rosana Hoes in 3 months.  I will share this discussion with Dr. Tammi Klippel and Dr. Rosana Hoes and have placed the order for PSA test today.  Nicholos Johns, MMS, PA-C [facility] [place]: [phone]  Fax: [phone]

## 2019-04-30 NOTE — Telephone Encounter (Signed)
CALLED PATIENT'S WIFE- SYLVIA TO INFORM OF LAB TODAY @ 1 PM, SPOKE WITH PATIENT AND SHE IS AWARE OF THIS APPT, I ALSO CALLED THE LAB AND SPOKE WITH MARIA AND INFORMED THAT PATIENT'S WIFE WILL BE COMING WITH HIM DUE TO SEVERE DEMENTIA, MARIA VERIFIED UNDERSTANDING THIS

## 2019-05-01 ENCOUNTER — Telehealth: Payer: Self-pay | Admitting: Urology

## 2019-05-01 ENCOUNTER — Ambulatory Visit: Payer: Medicare Other

## 2019-05-01 LAB — PROSTATE-SPECIFIC AG, SERUM (LABCORP): Prostate Specific Ag, Serum: 11.1 ng/mL — ABNORMAL HIGH (ref 0.0–4.0)

## 2019-05-01 NOTE — Telephone Encounter (Signed)
I called and left a message on the home answering machine this morning to inform of his recent PSA lab results.  The PSA from 04/30/2019 was 11.1 which is decreased from 15.3 in December 2019.  I advised that while there is still active cancer, this is encouraging that his disease is not rapidly progressing and does not indicate significant tumor growth since the time of diagnosis.  Based on our conversation from 04/30/2019, the patient will likely elect to proceed in close observation/active surveillance with Dr. Lawerance Bach and have a repeat PSA in 2 to 3 months time and make a decision as to whether he will resume radiation treatment based on those and future results.  The patient prefers not to resume radiation treatments unless there is a significant rise in his PSA indicating disease progression.  We also discussed the possibility of a repeat prostate biopsy at some point as part of his active surveillance if that is indeed how he elects to proceed.  I did request that his wife, Sunday Spillers, return my call to confirm how the patient would like to proceed.  Once I have this information, I will share it with both Dr. Lawerance Bach and Dr. Tammi Klippel to keep everyone in the loop.  Nicholos Johns, MMS, PA-C [facility] [place]: [phone]  Fax: [phone]

## 2019-05-04 ENCOUNTER — Ambulatory Visit: Payer: Medicare Other

## 2019-05-04 ENCOUNTER — Encounter: Payer: Self-pay | Admitting: Radiation Oncology

## 2019-05-04 NOTE — Progress Notes (Signed)
  Radiation Oncology         ([phone] ________________________________  Name: Wayne Walls MRN: [id number]  Date: 05/04/2019  DOB: 1933/10/22  End of Treatment Note - Treatment Not Completed  Diagnosis:   85 y.o. gentleman with Stage T1c adenocarcinoma of the prostate with Gleason score of 3+4, and PSA of 15.38.     Indication for treatment:  Curative, Definitive Radiotherapy       Radiation treatment dates:   3/9-3/13/2020  Site/dose:   The prostate was treated to 12.5 Gy in 5 fractions of 2.5 Gy  Beams/energy:   The patient was treated with IMRT using volumetric arc therapy delivering 6 MV X-rays to clockwise and counterclockwise circumferential arcs with a 90 degree collimator offset to avoid dose scalloping.  Image guidance was performed with daily cone beam CT prior to each fraction to align to gold markers in the prostate and assure proper bladder and rectal fill volumes.  Immobilization was achieved with Body[employer] custom mold.  Narrative: The patient tolerated radiation treatment relatively well.   The patient did not experience any side effects after completing only one week, of his planned 7.5 week course.  He stopped out of fear of COVID-19 exposure.  Plan: The patient has completed radiation treatment. He returned this past week for PSA check.  It was slightly lower.  He remains uncomfortable with COVID-19 exposure risk and has elected to pursue active surveillance with his referring doctor, Dr. Rosana Hoes. ________________________________  Sheral Apley. Tammi Klippel, M.D.

## 2019-05-05 ENCOUNTER — Ambulatory Visit: Payer: Medicare Other

## 2019-05-06 ENCOUNTER — Ambulatory Visit: Payer: Medicare Other

## 2019-05-07 ENCOUNTER — Ambulatory Visit: Payer: Medicare Other

## 2019-05-08 ENCOUNTER — Ambulatory Visit: Payer: Medicare Other

## 2019-05-11 ENCOUNTER — Ambulatory Visit: Payer: Medicare Other

## 2019-05-12 ENCOUNTER — Ambulatory Visit: Payer: Medicare Other

## 2019-05-13 ENCOUNTER — Ambulatory Visit: Payer: Medicare Other

## 2019-05-14 ENCOUNTER — Ambulatory Visit: Payer: Medicare Other

## 2019-05-15 ENCOUNTER — Ambulatory Visit: Payer: Medicare Other

## 2019-05-18 ENCOUNTER — Ambulatory Visit: Payer: Medicare Other

## 2019-05-19 ENCOUNTER — Ambulatory Visit: Payer: Medicare Other

## 2019-05-20 ENCOUNTER — Ambulatory Visit: Payer: Medicare Other

## 2019-05-21 ENCOUNTER — Ambulatory Visit: Payer: Medicare Other

## 2019-05-22 ENCOUNTER — Ambulatory Visit: Payer: Medicare Other

## 2019-05-25 ENCOUNTER — Ambulatory Visit: Payer: Medicare Other

## 2019-05-26 ENCOUNTER — Ambulatory Visit: Payer: Medicare Other

## 2019-05-27 ENCOUNTER — Ambulatory Visit: Payer: Medicare Other

## 2019-05-28 ENCOUNTER — Ambulatory Visit: Payer: Medicare Other

## 2019-05-29 ENCOUNTER — Ambulatory Visit: Payer: Medicare Other

## 2019-06-01 ENCOUNTER — Ambulatory Visit: Payer: Medicare Other

## 2019-06-02 ENCOUNTER — Ambulatory Visit: Payer: Medicare Other

## 2019-06-03 ENCOUNTER — Ambulatory Visit: Payer: Medicare Other

## 2019-06-03 DIAGNOSIS — H409 Unspecified glaucoma: Secondary | ICD-10-CM | POA: Diagnosis not present

## 2019-06-03 DIAGNOSIS — R269 Unspecified abnormalities of gait and mobility: Secondary | ICD-10-CM | POA: Diagnosis not present

## 2019-06-03 DIAGNOSIS — F329 Major depressive disorder, single episode, unspecified: Secondary | ICD-10-CM | POA: Diagnosis not present

## 2019-06-03 DIAGNOSIS — K5909 Other constipation: Secondary | ICD-10-CM | POA: Diagnosis not present

## 2019-06-03 DIAGNOSIS — G4739 Other sleep apnea: Secondary | ICD-10-CM | POA: Diagnosis not present

## 2019-06-03 DIAGNOSIS — D751 Secondary polycythemia: Secondary | ICD-10-CM | POA: Diagnosis not present

## 2019-06-03 DIAGNOSIS — C61 Malignant neoplasm of prostate: Secondary | ICD-10-CM | POA: Diagnosis not present

## 2019-06-03 DIAGNOSIS — R972 Elevated prostate specific antigen [PSA]: Secondary | ICD-10-CM | POA: Diagnosis not present

## 2019-06-03 DIAGNOSIS — R413 Other amnesia: Secondary | ICD-10-CM | POA: Diagnosis not present

## 2019-06-03 DIAGNOSIS — N401 Enlarged prostate with lower urinary tract symptoms: Secondary | ICD-10-CM | POA: Diagnosis not present

## 2019-06-03 DIAGNOSIS — J449 Chronic obstructive pulmonary disease, unspecified: Secondary | ICD-10-CM | POA: Diagnosis not present

## 2019-06-03 DIAGNOSIS — M1990 Unspecified osteoarthritis, unspecified site: Secondary | ICD-10-CM | POA: Diagnosis not present

## 2019-07-14 DIAGNOSIS — K52831 Collagenous colitis: Secondary | ICD-10-CM | POA: Diagnosis not present

## 2019-07-14 DIAGNOSIS — R972 Elevated prostate specific antigen [PSA]: Secondary | ICD-10-CM | POA: Diagnosis not present

## 2019-09-17 DIAGNOSIS — H401111 Primary open-angle glaucoma, right eye, mild stage: Secondary | ICD-10-CM | POA: Diagnosis not present

## 2019-12-16 DIAGNOSIS — Z125 Encounter for screening for malignant neoplasm of prostate: Secondary | ICD-10-CM | POA: Diagnosis not present

## 2019-12-16 DIAGNOSIS — Z79899 Other long term (current) drug therapy: Secondary | ICD-10-CM | POA: Diagnosis not present

## 2019-12-16 DIAGNOSIS — D751 Secondary polycythemia: Secondary | ICD-10-CM | POA: Diagnosis not present

## 2019-12-16 DIAGNOSIS — Z0000 Encounter for general adult medical examination without abnormal findings: Secondary | ICD-10-CM | POA: Diagnosis not present

## 2019-12-18 DIAGNOSIS — Z79899 Other long term (current) drug therapy: Secondary | ICD-10-CM | POA: Diagnosis not present

## 2019-12-18 DIAGNOSIS — D751 Secondary polycythemia: Secondary | ICD-10-CM | POA: Diagnosis not present

## 2019-12-21 DIAGNOSIS — R82998 Other abnormal findings in urine: Secondary | ICD-10-CM | POA: Diagnosis not present

## 2020-01-01 DIAGNOSIS — Z23 Encounter for immunization: Secondary | ICD-10-CM | POA: Diagnosis not present

## 2020-01-27 DIAGNOSIS — Z23 Encounter for immunization: Secondary | ICD-10-CM | POA: Diagnosis not present

## 2020-03-02 DIAGNOSIS — Z0000 Encounter for general adult medical examination without abnormal findings: Secondary | ICD-10-CM | POA: Diagnosis not present

## 2020-03-02 DIAGNOSIS — K5909 Other constipation: Secondary | ICD-10-CM | POA: Diagnosis not present

## 2020-03-02 DIAGNOSIS — C61 Malignant neoplasm of prostate: Secondary | ICD-10-CM | POA: Diagnosis not present

## 2020-03-02 DIAGNOSIS — R413 Other amnesia: Secondary | ICD-10-CM | POA: Diagnosis not present

## 2020-03-02 DIAGNOSIS — D751 Secondary polycythemia: Secondary | ICD-10-CM | POA: Diagnosis not present

## 2020-03-02 DIAGNOSIS — M1990 Unspecified osteoarthritis, unspecified site: Secondary | ICD-10-CM | POA: Diagnosis not present

## 2020-03-02 DIAGNOSIS — L89309 Pressure ulcer of unspecified buttock, unspecified stage: Secondary | ICD-10-CM | POA: Diagnosis not present

## 2020-03-02 DIAGNOSIS — N401 Enlarged prostate with lower urinary tract symptoms: Secondary | ICD-10-CM | POA: Diagnosis not present

## 2020-03-02 DIAGNOSIS — R972 Elevated prostate specific antigen [PSA]: Secondary | ICD-10-CM | POA: Diagnosis not present

## 2020-03-02 DIAGNOSIS — J449 Chronic obstructive pulmonary disease, unspecified: Secondary | ICD-10-CM | POA: Diagnosis not present

## 2020-03-02 DIAGNOSIS — F329 Major depressive disorder, single episode, unspecified: Secondary | ICD-10-CM | POA: Diagnosis not present

## 2020-03-02 DIAGNOSIS — Z720 Tobacco use: Secondary | ICD-10-CM | POA: Diagnosis not present

## 2020-06-20 DIAGNOSIS — D751 Secondary polycythemia: Secondary | ICD-10-CM | POA: Diagnosis not present

## 2020-06-20 DIAGNOSIS — R269 Unspecified abnormalities of gait and mobility: Secondary | ICD-10-CM | POA: Diagnosis not present

## 2020-06-20 DIAGNOSIS — F329 Major depressive disorder, single episode, unspecified: Secondary | ICD-10-CM | POA: Diagnosis not present

## 2020-06-20 DIAGNOSIS — Z720 Tobacco use: Secondary | ICD-10-CM | POA: Diagnosis not present

## 2020-06-20 DIAGNOSIS — R413 Other amnesia: Secondary | ICD-10-CM | POA: Diagnosis not present

## 2020-07-07 DIAGNOSIS — K52831 Collagenous colitis: Secondary | ICD-10-CM | POA: Diagnosis not present

## 2020-09-20 DIAGNOSIS — R531 Weakness: Secondary | ICD-10-CM | POA: Diagnosis not present

## 2020-09-20 DIAGNOSIS — F0390 Unspecified dementia without behavioral disturbance: Secondary | ICD-10-CM | POA: Diagnosis not present

## 2020-09-20 DIAGNOSIS — K529 Noninfective gastroenteritis and colitis, unspecified: Secondary | ICD-10-CM | POA: Diagnosis not present

## 2020-12-19 ENCOUNTER — Emergency Department (HOSPITAL_COMMUNITY): Payer: Medicare HMO

## 2020-12-19 ENCOUNTER — Other Ambulatory Visit: Payer: Self-pay

## 2020-12-19 ENCOUNTER — Inpatient Hospital Stay (HOSPITAL_COMMUNITY)
Admission: EM | Admit: 2020-12-19 | Discharge: 2020-12-23 | DRG: 064 | Disposition: A | Discharge status: Home Health | Payer: Medicare HMO | Attending: Internal Medicine | Admitting: Internal Medicine

## 2020-12-19 ENCOUNTER — Encounter (HOSPITAL_COMMUNITY): Payer: Self-pay | Admitting: Pediatrics

## 2020-12-19 DIAGNOSIS — U071 COVID-19: Secondary | ICD-10-CM | POA: Diagnosis present

## 2020-12-19 DIAGNOSIS — J1282 Pneumonia due to coronavirus disease 2019: Secondary | ICD-10-CM | POA: Diagnosis present

## 2020-12-19 DIAGNOSIS — R29701 NIHSS score 1: Secondary | ICD-10-CM | POA: Diagnosis present

## 2020-12-19 DIAGNOSIS — I6320 Cerebral infarction due to unspecified occlusion or stenosis of unspecified precerebral arteries: Secondary | ICD-10-CM | POA: Diagnosis not present

## 2020-12-19 DIAGNOSIS — J449 Chronic obstructive pulmonary disease, unspecified: Secondary | ICD-10-CM | POA: Diagnosis present

## 2020-12-19 DIAGNOSIS — Z7982 Long term (current) use of aspirin: Secondary | ICD-10-CM | POA: Diagnosis not present

## 2020-12-19 DIAGNOSIS — R1310 Dysphagia, unspecified: Secondary | ICD-10-CM | POA: Diagnosis present

## 2020-12-19 DIAGNOSIS — R296 Repeated falls: Secondary | ICD-10-CM | POA: Diagnosis present

## 2020-12-19 DIAGNOSIS — I6381 Other cerebral infarction due to occlusion or stenosis of small artery: Principal | ICD-10-CM | POA: Diagnosis present

## 2020-12-19 DIAGNOSIS — I10 Essential (primary) hypertension: Secondary | ICD-10-CM | POA: Diagnosis present

## 2020-12-19 DIAGNOSIS — J9601 Acute respiratory failure with hypoxia: Secondary | ICD-10-CM | POA: Diagnosis present

## 2020-12-19 DIAGNOSIS — H409 Unspecified glaucoma: Secondary | ICD-10-CM | POA: Diagnosis present

## 2020-12-19 DIAGNOSIS — I639 Cerebral infarction, unspecified: Secondary | ICD-10-CM | POA: Diagnosis present

## 2020-12-19 DIAGNOSIS — D751 Secondary polycythemia: Secondary | ICD-10-CM | POA: Diagnosis present

## 2020-12-19 DIAGNOSIS — G4733 Obstructive sleep apnea (adult) (pediatric): Secondary | ICD-10-CM | POA: Diagnosis present

## 2020-12-19 DIAGNOSIS — E785 Hyperlipidemia, unspecified: Secondary | ICD-10-CM | POA: Diagnosis present

## 2020-12-19 DIAGNOSIS — Z8249 Family history of ischemic heart disease and other diseases of the circulatory system: Secondary | ICD-10-CM | POA: Diagnosis not present

## 2020-12-19 DIAGNOSIS — M1990 Unspecified osteoarthritis, unspecified site: Secondary | ICD-10-CM | POA: Diagnosis present

## 2020-12-19 DIAGNOSIS — I371 Nonrheumatic pulmonary valve insufficiency: Secondary | ICD-10-CM | POA: Diagnosis not present

## 2020-12-19 DIAGNOSIS — F0390 Unspecified dementia without behavioral disturbance: Secondary | ICD-10-CM | POA: Diagnosis not present

## 2020-12-19 DIAGNOSIS — Z79899 Other long term (current) drug therapy: Secondary | ICD-10-CM | POA: Diagnosis not present

## 2020-12-19 DIAGNOSIS — I6389 Other cerebral infarction: Secondary | ICD-10-CM | POA: Diagnosis not present

## 2020-12-19 DIAGNOSIS — L89312 Pressure ulcer of right buttock, stage 2: Secondary | ICD-10-CM | POA: Diagnosis present

## 2020-12-19 DIAGNOSIS — L89322 Pressure ulcer of left buttock, stage 2: Secondary | ICD-10-CM | POA: Diagnosis present

## 2020-12-19 DIAGNOSIS — R29810 Facial weakness: Secondary | ICD-10-CM | POA: Diagnosis present

## 2020-12-19 DIAGNOSIS — C61 Malignant neoplasm of prostate: Secondary | ICD-10-CM | POA: Diagnosis present

## 2020-12-19 DIAGNOSIS — I351 Nonrheumatic aortic (valve) insufficiency: Secondary | ICD-10-CM | POA: Diagnosis not present

## 2020-12-19 DIAGNOSIS — I6300 Cerebral infarction due to thrombosis of unspecified precerebral artery: Secondary | ICD-10-CM | POA: Diagnosis not present

## 2020-12-19 LAB — APTT: aPTT: 32 seconds (ref 24–36)

## 2020-12-19 LAB — PROTIME-INR
INR: 1 (ref 0.8–1.2)
Prothrombin Time: 13.1 seconds (ref 11.4–15.2)

## 2020-12-19 LAB — I-STAT CHEM 8, ED
BUN: 20 mg/dL (ref 8–23)
Calcium, Ion: 1.12 mmol/L — ABNORMAL LOW (ref 1.15–1.40)
Chloride: 106 mmol/L (ref 98–111)
Creatinine, Ser: 1 mg/dL (ref 0.61–1.24)
Glucose, Bld: 119 mg/dL — ABNORMAL HIGH (ref 70–99)
HCT: 51 % (ref 39.0–52.0)
Hemoglobin: 17.3 g/dL — ABNORMAL HIGH (ref 13.0–17.0)
Potassium: 3.5 mmol/L (ref 3.5–5.1)
Sodium: 143 mmol/L (ref 135–145)
TCO2: 27 mmol/L (ref 22–32)

## 2020-12-19 LAB — COMPREHENSIVE METABOLIC PANEL
ALT: 20 U/L (ref 0–44)
AST: 36 U/L (ref 15–41)
Albumin: 3.8 g/dL (ref 3.5–5.0)
Alkaline Phosphatase: 104 U/L (ref 38–126)
Anion gap: 10 (ref 5–15)
BUN: 17 mg/dL (ref 8–23)
CO2: 24 mmol/L (ref 22–32)
Calcium: 8.9 mg/dL (ref 8.9–10.3)
Chloride: 106 mmol/L (ref 98–111)
Creatinine, Ser: 1.09 mg/dL (ref 0.61–1.24)
GFR, Estimated: >60 mL/min (ref >60)
Glucose, Bld: 121 mg/dL — ABNORMAL HIGH (ref 70–99)
Potassium: 3.5 mmol/L (ref 3.5–5.1)
Sodium: 140 mmol/L (ref 135–145)
Total Bilirubin: 1.3 mg/dL — ABNORMAL HIGH (ref 0.3–1.2)
Total Protein: 6.7 g/dL (ref 6.5–8.1)

## 2020-12-19 LAB — CBC
HCT: 55.6 % — ABNORMAL HIGH (ref 39.0–52.0)
Hemoglobin: 18.1 g/dL — ABNORMAL HIGH (ref 13.0–17.0)
MCH: 33 pg (ref 26.0–34.0)
MCHC: 32.6 g/dL (ref 30.0–36.0)
MCV: 101.5 fL — ABNORMAL HIGH (ref 80.0–100.0)
Platelets: 136 10*3/uL — ABNORMAL LOW (ref 150–400)
RBC: 5.48 MIL/uL (ref 4.22–5.81)
RDW: 13.2 % (ref 11.5–15.5)
WBC: 6.1 10*3/uL (ref 4.0–10.5)
nRBC: 0 % (ref 0.0–0.2)

## 2020-12-19 LAB — DIFFERENTIAL
Abs Immature Granulocytes: 0.08 10*3/uL — ABNORMAL HIGH (ref 0.00–0.07)
Basophils Absolute: 0 10*3/uL (ref 0.0–0.1)
Basophils Relative: 0 %
Eosinophils Absolute: 0 10*3/uL (ref 0.0–0.5)
Eosinophils Relative: 0 %
Immature Granulocytes: 1 %
Lymphocytes Relative: 12 %
Lymphs Abs: 0.8 10*3/uL (ref 0.7–4.0)
Monocytes Absolute: 0.8 10*3/uL (ref 0.1–1.0)
Monocytes Relative: 12 %
Neutro Abs: 4.4 10*3/uL (ref 1.7–7.7)
Neutrophils Relative %: 75 %

## 2020-12-19 IMAGING — DX DG CHEST 1V PORT
1 series · 1 of 1 positions shown · non-contrast
Comparison: Prior radiograph from [DATE].

CLINICAL DATA: Initial evaluation for acute dysphagia.

EXAM:
PORTABLE CHEST 1 VIEW

[chest]
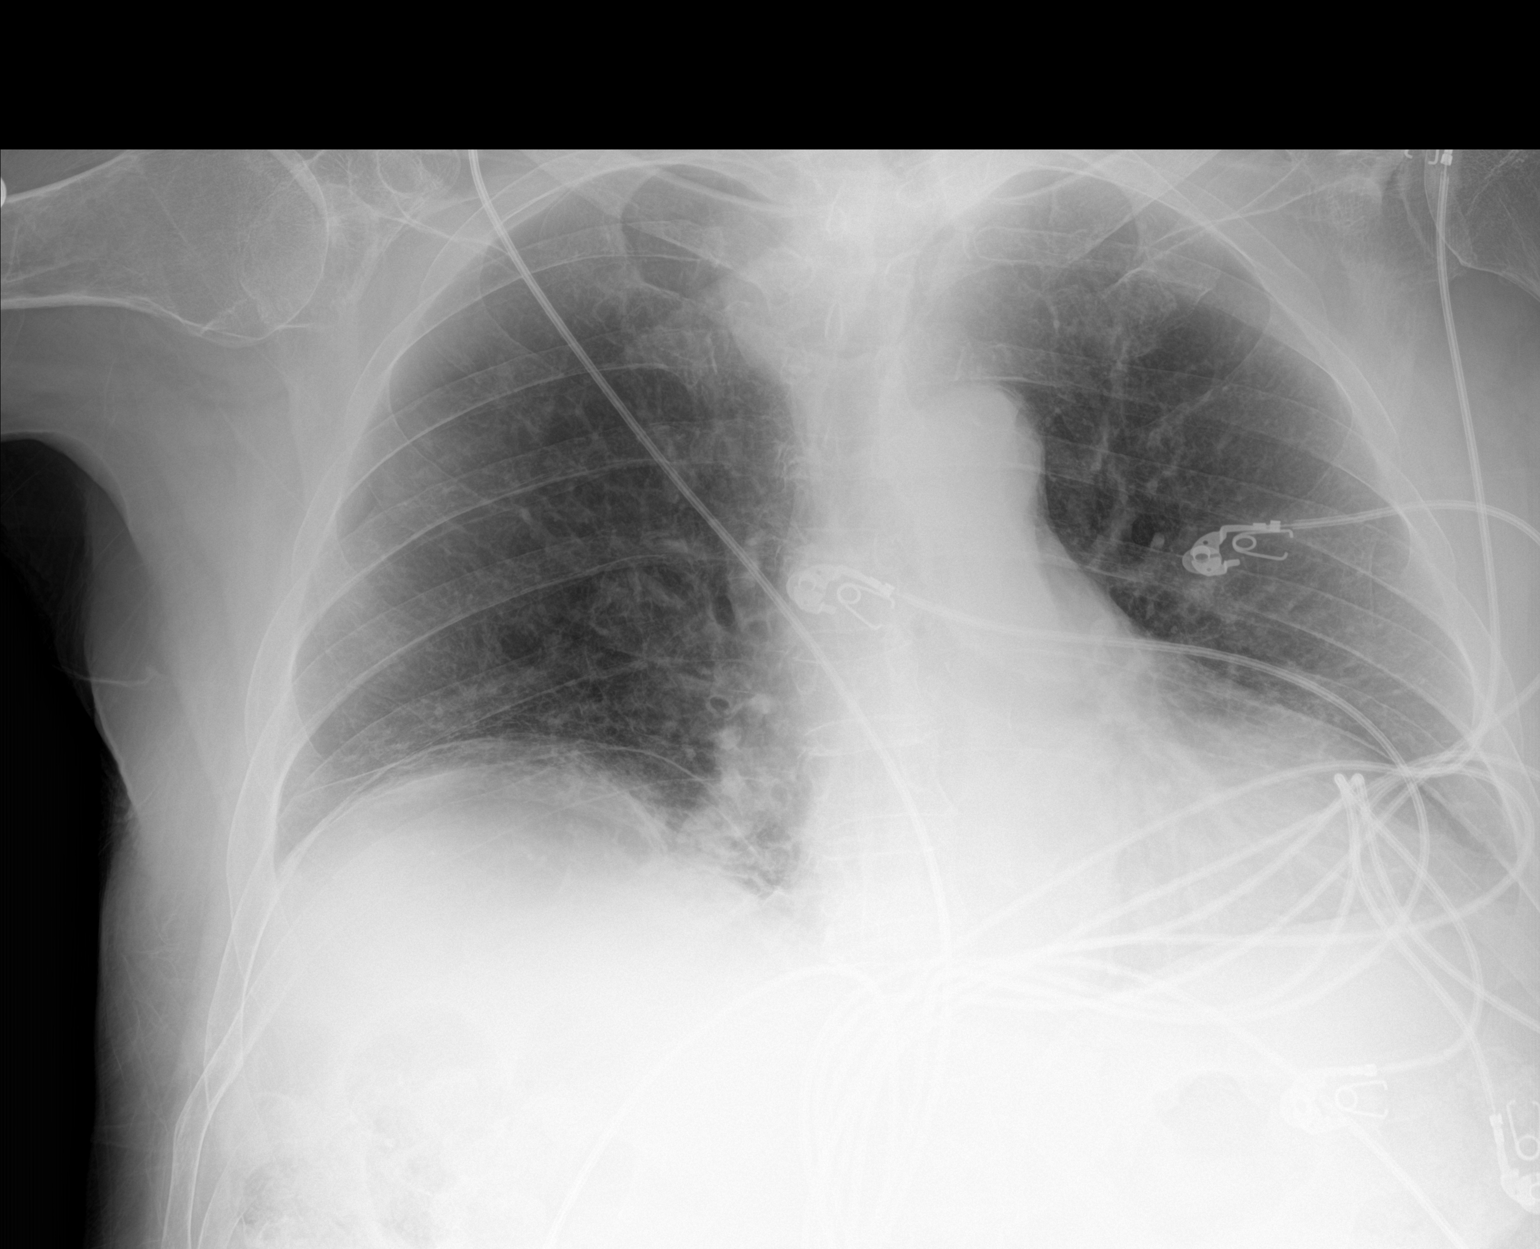

[1 of 1 positions shown; findings below may reference images not displayed]

FINDINGS: Exaggeration of the cardiac silhouette related to AP technique and
shallow lung inflation. Mediastinal silhouette normal. Tortuosity
the intrathoracic aorta noted. Slight left-to-right deviation of the
tracheal air column at the level of the aortic knob, similar to
previous. No appreciable esophageal dilatation.

Lungs are hypoinflated. Streaky and linear opacities at the right
greater than left lung bases felt to be most consistent with
atelectasis. No other focal infiltrates. No pulmonary edema or
visible pleural effusion. Left costophrenic angle incompletely
visualized. No pneumothorax.

No acute osseous finding.  Osteopenia noted.
IMPRESSION: 1. Shallow lung inflation with associated bibasilar atelectasis,
right greater than left.
2. No other active cardiopulmonary disease.

## 2020-12-19 IMAGING — CT CT HEAD W/O CM
4 series · 16 of 47 positions shown, 18 images · non-contrast
Comparison: None.

CLINICAL DATA: Neuro deficit, right facial droop and dysphagia.
Status post fall

EXAM:
CT HEAD WITHOUT CONTRAST
TECHNIQUE: Contiguous axial images were obtained from the base of the skull
through the vertex without intravenous contrast.

[Series 3: head wo · axial · 0.49mm/px · z∈[-172,-38]mm · 7 of 37 slices shown, 9 images]
[im 5/37  brain]
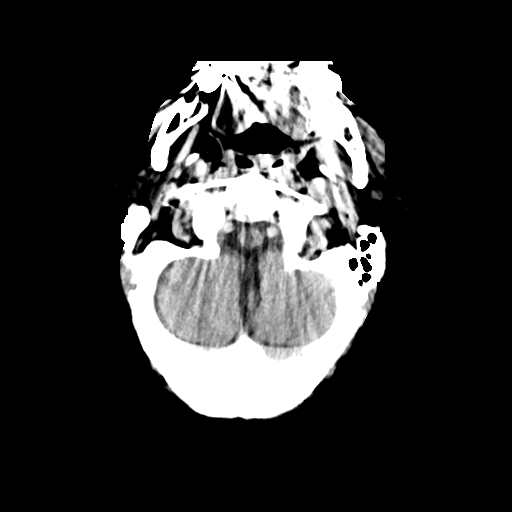
[im 5/37  bone]
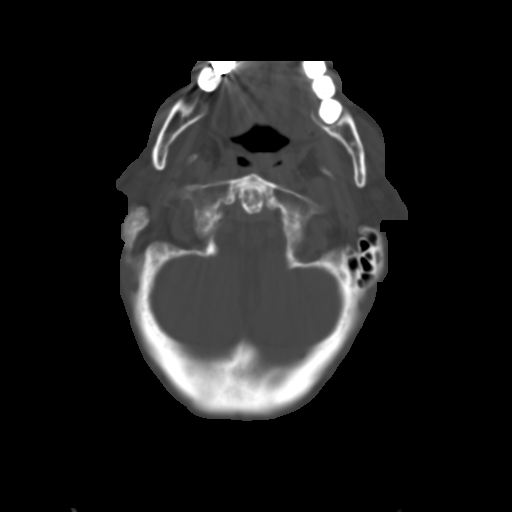
[im 10/37  brain]
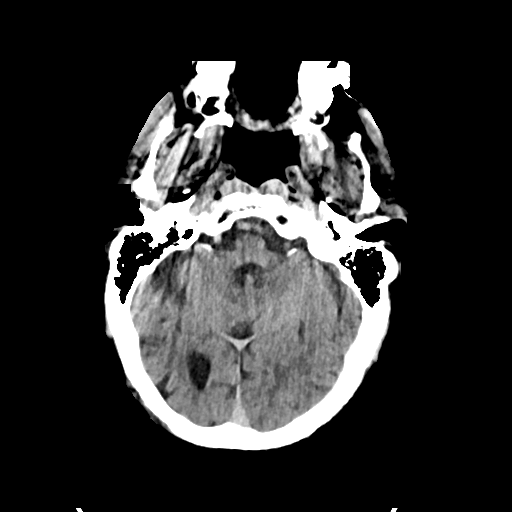
[im 14/37  brain]
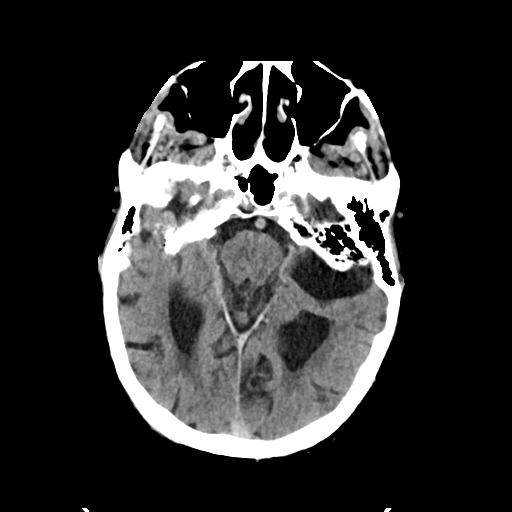
[im 19/37  brain]
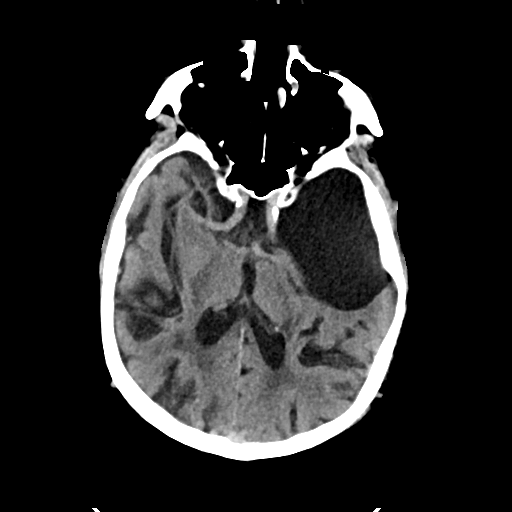
[im 23/37  brain]
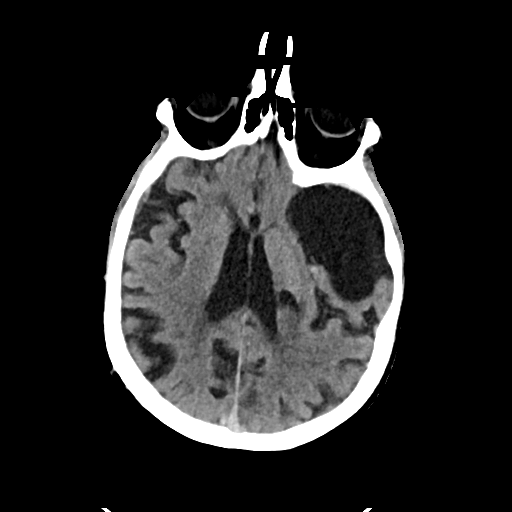
[im 23/37  bone]
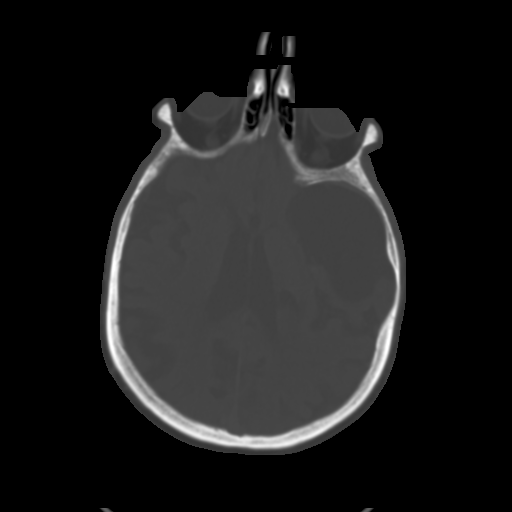
[im 28/37  brain]
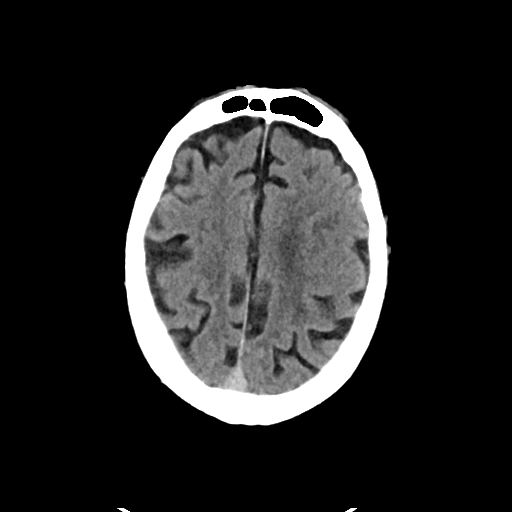
[im 32/37  brain]
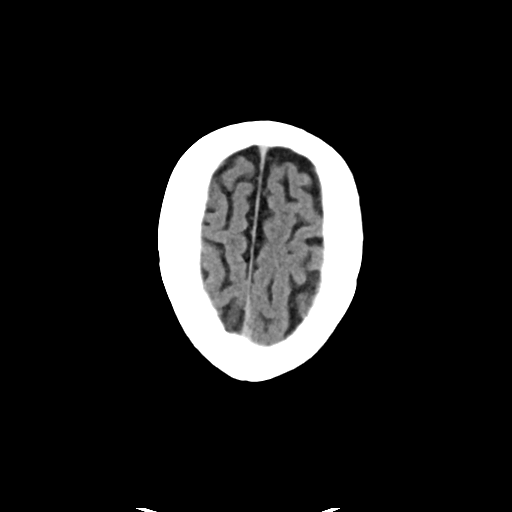

[Series 4: head bone · axial · 0.49mm/px · z∈[-174,-138]mm · 3 of 91 slices shown]
[im 10/91  bone]
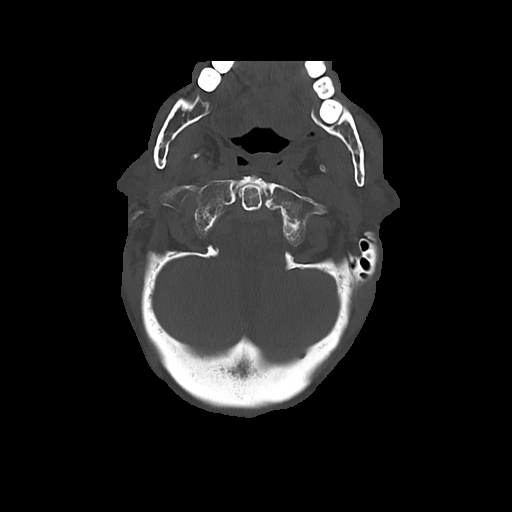
[im 19/91  bone]
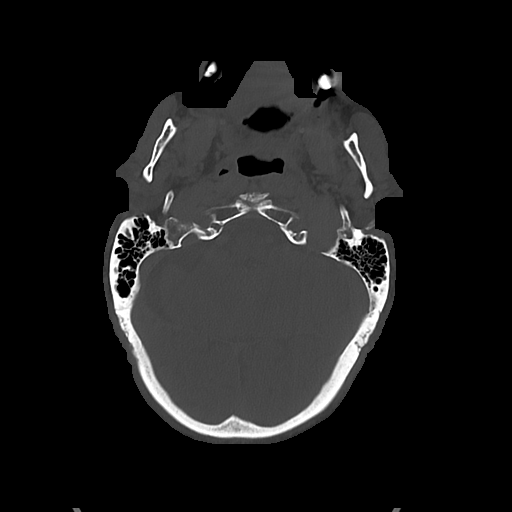
[im 28/91  bone]
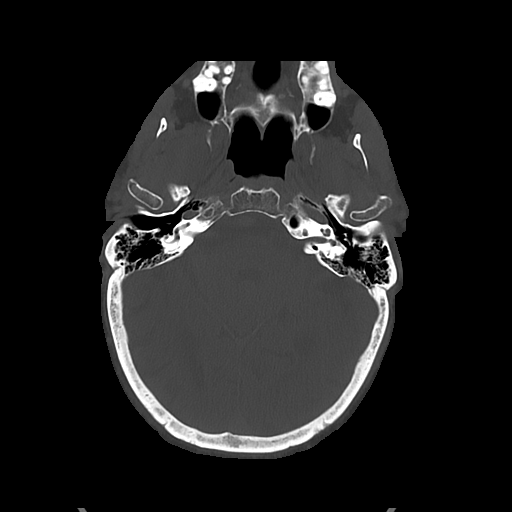

[Series 5: cor soft · coronal · 0.38mm/px · 3 of 91 slices shown]
[im 36/91  brain]
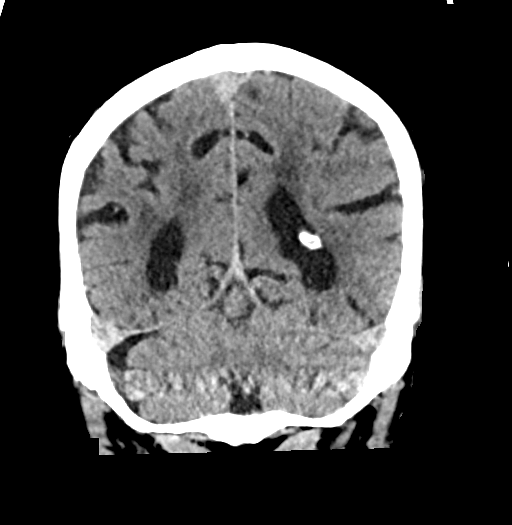
[im 42/91  brain]
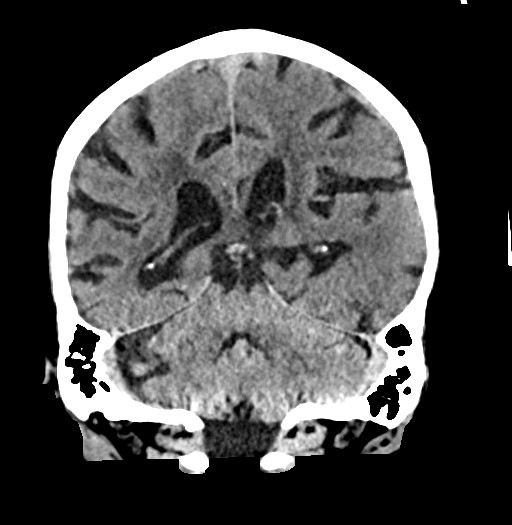
[im 49/91  brain]
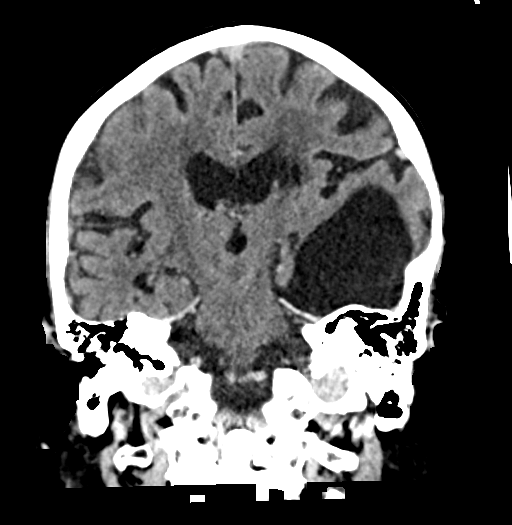

[Series 6: sag soft · sagittal · 0.40mm/px · 3 of 65 slices shown]
[im 22/65  brain]
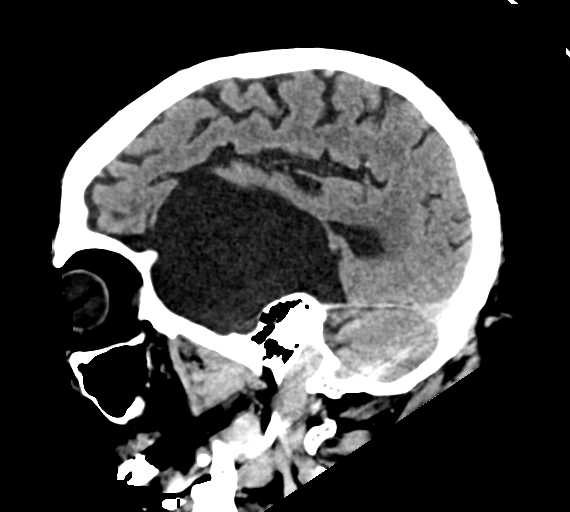
[im 33/65  brain]
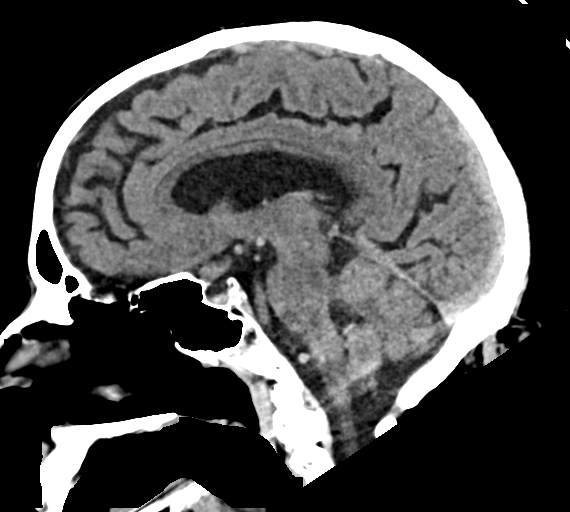
[im 43/65  brain]
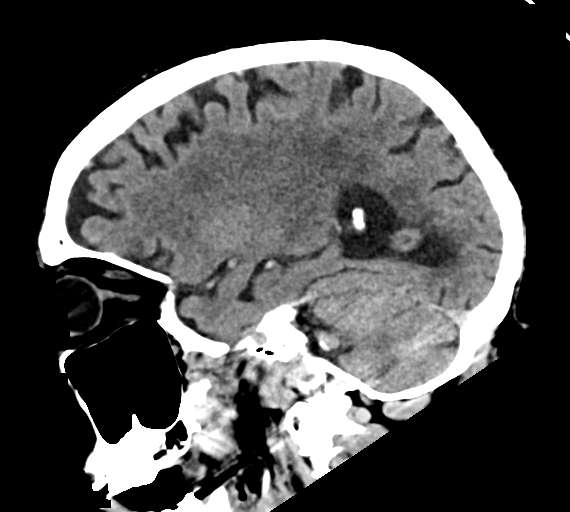

[16 of 47 positions shown; findings below may reference images not displayed]

FINDINGS: Brain:

Cerebral ventricle sizes are concordant with the degree of cerebral
volume loss.

Patchy and confluent areas of decreased attenuation are noted
throughout the deep and periventricular white matter of the cerebral
hemispheres bilaterally, compatible with chronic microvascular
ischemic disease.

No evidence of large-territorial acute infarction. No parenchymal
hemorrhage. No mass lesion. No extra-axial collection.

Large 9 cm left anterior temporal cystic lesion similar to
cerebrospinal fluid. Associated mass effect on the surrounding
cerebral cortex. Another cystic lesion is noted along the frontal
horn of the left lateral ventricle ([DATE]). No midline shift. No
hydrocephalus. Basilar cisterns are patent.

Vascular: No hyperdense vessel. Atherosclerotic calcifications are
present within the cavernous internal carotid arteries.

Skull: No acute fracture or focal lesion.

Sinuses/Orbits: Paranasal sinuses and mastoid air cells are clear.
The orbits are unremarkable.

Other: None.
IMPRESSION: 1. No acute intracranial abnormality.
2. Large 9 cm left anterior temporal cystic lesion likely
representing an arachnoid cyst. Associated chronic mass effect on
the surrounding frontal and temporal lobe. Differential diagnosis
includes epidermoid cyst. Comparison with prior cross-sectional
imaging would be of value to evaluate stability.

## 2020-12-19 MED ORDER — STROKE: EARLY STAGES OF RECOVERY BOOK
Freq: Once | Status: DC
  Filled 2020-12-19: qty 1

## 2020-12-19 MED ORDER — DONEPEZIL HCL 5 MG PO TABS
5.0000 mg | ORAL_TABLET | Freq: Every day | ORAL | Status: DC
  Administered 2020-12-20 – 2020-12-22 (×3): 5 mg via ORAL
  Filled 2020-12-19 (×4): qty 1

## 2020-12-19 MED ORDER — ASPIRIN 325 MG PO TABS
325.0000 mg | ORAL_TABLET | Freq: Every day | ORAL | Status: DC
  Administered 2020-12-19: 325 mg via ORAL
  Filled 2020-12-19: qty 1

## 2020-12-19 MED ORDER — ASPIRIN EC 81 MG PO TBEC
81.0000 mg | DELAYED_RELEASE_TABLET | Freq: Every morning | ORAL | Status: DC
  Administered 2020-12-20 – 2020-12-23 (×4): 81 mg via ORAL
  Filled 2020-12-19 (×4): qty 1

## 2020-12-19 MED ORDER — ALBUTEROL SULFATE HFA 108 (90 BASE) MCG/ACT IN AERS
1.0000 | INHALATION_SPRAY | RESPIRATORY_TRACT | Status: DC | PRN
  Filled 2020-12-19: qty 6.7

## 2020-12-19 MED ORDER — CLOPIDOGREL BISULFATE 75 MG PO TABS
75.0000 mg | ORAL_TABLET | Freq: Every day | ORAL | Status: DC
  Administered 2020-12-20 – 2020-12-23 (×4): 75 mg via ORAL
  Filled 2020-12-19 (×4): qty 1

## 2020-12-19 MED ORDER — LATANOPROST 0.005 % OP SOLN
1.0000 [drp] | Freq: Every day | OPHTHALMIC | Status: DC
  Administered 2020-12-20 – 2020-12-22 (×3): 1 [drp] via OPHTHALMIC
  Filled 2020-12-19: qty 2.5

## 2020-12-19 NOTE — ED Triage Notes (Signed)
Arrived to ED via EMS from home. Reported re-current falls and most current one this afternoon. Family concerned for increased speaking difficulty and right sided mouth droop. Denies blood thinners and  LOC. Patient is A&Ox1; follows commands.

## 2020-12-19 NOTE — ED Notes (Signed)
Patient transported to CT 

## 2020-12-19 NOTE — H&P (Signed)
History and Physical    PLEASE NOTE THAT DRAGON DICTATION SOFTWARE WAS USED IN THE CONSTRUCTION OF THIS NOTE.   Wayne Walls [id number] DOB: 1933-09-25 DOA: 12/19/2020  PCP: Burnard Bunting, MD Patient coming from: home   I have personally briefly reviewed patient's old medical records in [place]  Chief Complaint: Frequent falls  HPI: Wayne Walls is a 87 y.o. male with medical history significant for dementia, COPD, chronic polycythemia, glaucoma who is admitted to [facility] on 12/19/2020 with suspected acute ischemic stroke after presenting from home via EMS to [facility] for further evaluation frequent falls.   In the context of the patient's reported baseline dementia, the following history is provided by the patient's wife as well as my discussions with the emergency department physician and via chart review.  The patient's wife reports that the patient has been exhibiting frequent falls over the last 3 to 4 days, noting that the patient has fallen 1-2 times per day over that timeframe, which represents a change from his baseline ambulatory instability standpoint, in which the patient rarely falls according to the patient's wife.  She denies any known associated loss of consciousness, but rather reports that it appears that the patient is falling due to tripping on objects at home while attempting to ambulate.  She does not believe that he has hit his head as a component of these falls.  In the context of this new development of frequent falls, the patient's wife contacted EMS earlier today.  EMS reportedly noted the patient to exhibit right-sided facial droop, the patient's wife reports that she has never previously noticed, although she is unsure when the right-sided facial droop may have started.  Specifically she is unsure if there is a facial droop started today or if this has been something that has been present for days or weeks, and that she is  just not previously noted before this finding was pointed out to her by EMS earlier today.   Additionally, over the last 3 to 4 days, the patient's wife notes that the patient has been coughing to the point where he appears to intermittently be choking when attempting to eat food over that timeframe.  She conveys that this is a new development.  She also reports that the patient has been exhibiting progressive generalized weakness over the last several months, and is now requiring increasing assistance with performance of all of his ADLs, which the wife reports falls squarely on her shoulders.  She notes that she is finding it progressively more difficult to provide the level of care that patient needs given this progressive increase in assistance with his ADLs.   Patient's wife confirms that the patient discusses an underlying history of dementia, and conveys that at baseline the patient is alert, but not oriented to person, place, or time.  She has not noted any recent altered mental status relative to this baseline.  Past medical history is notable for a history of chronic polycythemia, with baseline hemoglobin level of 17.0-18.5 dating back to 2018.  He is currently on a daily baby aspirin as a sole outpatient blood thinning agent.  It does not appear that the patient has any documented underlying history of hypertension, hyperlipidemia, diabetes, or atrial fibrillation.  He is not currently on a statin as an outpatient.  Per chart review, there appears to be documentation of a history of COPD, although I have not been able to locate any substantiating pulmonary function test  results.  Wife confirms that the patient does not have any baseline supplemental oxygen requirements.  No recent traveling or trauma.  No known COVID-19 contacts.     ED Course:  Vital signs in the ED were notable for the following: Temperature 99.0; heart rate 82-87; blood pressure 128/58 - 153/83; respiratory rate 18-26;  initial oxygen saturation noted to be 89% on room air, which is increased to 94% on 3 L nasal cannula.  Labs were notable for the following: CMP was notable for the following: Sodium 130, potassium 3.5, bicarbonate 24, anion gap 10, creatinine 1.09, glucose 121.  Urinalysis and urinary drug screen ordered in the ED, with the results of the studies currently pending.  CBC was notable for the following: Lipid cell count of 6100, hemoglobin 17.3 relative to most recent prior value of 18.5 in November 2018, platelets 136.  INR 1.0.  EKG showed sinus rhythm with single PAC and ventricular rate 86, nonspecific T wave inversion in V1 through V3, of which T wave inversion in V1 and V2 is noted to be present in most recent prior EKG from November 2014; presenting EKG also showed no evidence of ST changes, including no evidence of ST elevation.  Chest x-ray has been ordered, with results currently pending.  Noncontrast CT of the head showed no evidence of acute intracranial process, but showed a 9 cm left anterior temporal cystic lesion felt to likely represent an arachnoid cyst with associated evidence of chronic mass-effect.   The patient's case and imaging were discussed with the on-call neurologist, Dr. Theda Sers, who will formally consult on this patient suspected acute ischemic stroke in the setting of new onset right facial droop with potential dysphagia.  Dr. Theda Sers initial recommendations include the following: MRI brain without contrast, MRA of the head and neck, echo with bubble study,TSH hemoglobin A1c, lipid panel with plan to initiate statin therapy if LDL is found to be greater than 70; initiation of dual antiplatelet therapy with daily baby aspirin as well as Plavix 75 mg p.o. daily, PT, OT, ST consultation, as well as checking of CPK.  While in the ED, the following were administered: Aspirin 324 mg p.o. x1.     Review of Systems: As per HPI otherwise 10 point review of systems negative.   Past  Medical History:  Diagnosis Date  . COPD (chronic obstructive pulmonary disease) ([place])   . Dementia ([place])   . Depression   . Osteoarthrosis, unspecified whether generalized or localized, unspecified site   . Prostate cancer ([place])   . Syncope    Carotids, cardiology  . Unspecified glaucoma(365.9)   . Unspecified sleep apnea    Chronic    Past Surgical History:  Procedure Laterality Date  . HERNIA REPAIR      Social History:  reports that he has never smoked. He has never used smokeless tobacco. He reports previous alcohol use. He reports that he does not use drugs.   No Known Allergies  Family History  Problem Relation Age of Onset  . Heart disease Mother   . Hypertension Mother   . Breast cancer Sister   . Prostate cancer Neg Hx   . Colon cancer Neg Hx   . Pancreatic cancer Neg Hx      Prior to Admission medications   Medication Sig Start Date End Date Taking? Authorizing Provider  aspirin 81 MG tablet Take 81 mg by mouth every evening.     [provider]  budesonide (ENTOCORT EC) 3 MG  24 hr capsule  01/21/19   [provider]  cholecalciferol (VITAMIN D) 1000 UNITS tablet Take 2,000 Units by mouth daily.     [provider]  donepezil (ARICEPT) 5 MG tablet Take 5 mg by mouth at bedtime. 12/05/17   [provider]  hydrocortisone 2.5 % cream Apply 1 application topically daily. 05/21/16   [provider]  ibuprofen (ADVIL,MOTRIN) 600 MG tablet TK 1 T PO Q 6 TO 8 H PRN P 11/17/18   [provider]  ketoconazole (NIZORAL) 2 % cream Apply 1 application topically daily as needed. 03/27/16   [provider]  Lactobacillus Rhamnosus, GG, (CULTURELLE) CAPS Take by mouth.    [provider]  latanoprost (XALATAN) 0.005 % ophthalmic solution Place 1 drop into the left eye at bedtime. As directed 10/15/13   [provider]  polyethylene glycol (MIRALAX) packet Take 17 g by mouth daily. 06/08/16   Mabe,  Forbes Cellar, MD  potassium chloride (K-DUR) 10 MEQ tablet TK 1 T PO D 05/06/18   [provider]     Objective    Physical Exam: Vitals:   12/19/20 1816 12/19/20 1845  BP: (!) 166/94 (!) 128/58  Pulse: 86 83  Resp: 18 18  Temp: 99 F (37.2 C)   TempSrc: Oral   SpO2: 90% (!) 89%    General: appears to be stated age; alert; oriented to self only (knows his full name and DOB); not oriented to time (thinks it is currently the 1980's) not oriented to person (does not know current [place] president); no oriented to place (knows that he is in the hospital, but is unsure as to which specific hospital). Skin: warm, dry, no rash Head:  AT/[place] Mouth:  Oral mucosa membranes appear moist, normal dentition Neck: supple; trachea midline Heart:  RRR; did not appreciate any M/R/G Lungs: CTAB, did not appreciate any wheezes, rales, or rhonchi Abdomen: + BS; soft, ND, NT Vascular: 2+ pedal pulses b/l; 2+ radial pulses b/l Extremities: no peripheral edema, no muscle wasting Neuro: 5/5 strength of the proximal and distal flexors and extensors of the upper and lower extremities bilaterally; sensation intact in upper and lower extremities b/l; cranial nerves II through XII grossly intact; no evidence suggestive of slurred speech; right-sided facial droop noted; normal muscle tone.  No tremors.     Labs on Admission: I have personally reviewed following labs and imaging studies  CBC: Recent Labs  Lab 12/19/20 1803 12/19/20 1814  WBC 6.1  --   NEUTROABS 4.4  --   HGB 18.1* 17.3*  HCT 55.6* 51.0  MCV 101.5*  --   PLT 136*  --    Basic Metabolic Panel: Recent Labs  Lab 12/19/20 1803 12/19/20 1814  NA 140 143  K 3.5 3.5  CL 106 106  CO2 24  --   GLUCOSE 121* 119*  BUN 17 20  CREATININE 1.09 1.00  CALCIUM 8.9  --    GFR: CrCl cannot be calculated (Unknown ideal weight.). Liver Function Tests: Recent Labs  Lab 12/19/20 1803  AST 36  ALT 20  ALKPHOS 104  BILITOT 1.3*  PROT 6.7   ALBUMIN 3.8   No results for input(s): LIPASE, AMYLASE in the last 168 hours. No results for input(s): AMMONIA in the last 168 hours. Coagulation Profile: Recent Labs  Lab 12/19/20 1803  INR 1.0   Cardiac Enzymes: No results for input(s): CKTOTAL, CKMB, CKMBINDEX, TROPONINI in the last 168 hours. BNP (last 3 results) No  results for input(s): PROBNP in the last 8760 hours. HbA1C: No results for input(s): HGBA1C in the last 72 hours. CBG: No results for input(s): GLUCAP in the last 168 hours. Lipid Profile: No results for input(s): CHOL, HDL, LDLCALC, TRIG, CHOLHDL, LDLDIRECT in the last 72 hours. Thyroid Function Tests: No results for input(s): TSH, T4TOTAL, FREET4, T3FREE, THYROIDAB in the last 72 hours. Anemia Panel: No results for input(s): VITAMINB12, FOLATE, FERRITIN, TIBC, IRON, RETICCTPCT in the last 72 hours. Urine analysis:    Component Value Date/Time   COLORURINE YELLOW 06/08/2016 1933   APPEARANCEUR HAZY (A) 06/08/2016 1935   LABSPEC 1.016 06/08/2016 1935   PHURINE 7.5 06/08/2016 1935   GLUCOSEU NEGATIVE 06/08/2016 1935   HGBUR NEGATIVE 06/08/2016 1935   BILIRUBINUR NEGATIVE 06/08/2016 1935   KETONESUR NEGATIVE 06/08/2016 1935   PROTEINUR NEGATIVE 06/08/2016 1935   NITRITE NEGATIVE 06/08/2016 1935   LEUKOCYTESUR NEGATIVE 06/08/2016 1935    Radiological Exams on Admission: CT HEAD WO CONTRAST  Result Date: 12/19/2020 CLINICAL DATA:  Neuro deficit, right facial droop and dysphagia. Status post fall EXAM: CT HEAD WITHOUT CONTRAST TECHNIQUE: Contiguous axial images were obtained from the base of the skull through the vertex without intravenous contrast. COMPARISON:  None. FINDINGS: Brain: Cerebral ventricle sizes are concordant with the degree of cerebral volume loss. Patchy and confluent areas of decreased attenuation are noted throughout the deep and periventricular white matter of the cerebral hemispheres bilaterally, compatible with chronic microvascular  ischemic disease. No evidence of large-territorial acute infarction. No parenchymal hemorrhage. No mass lesion. No extra-axial collection. Large 9 cm left anterior temporal cystic lesion similar to cerebrospinal fluid. Associated mass effect on the surrounding cerebral cortex. Another cystic lesion is noted along the frontal horn of the left lateral ventricle (5:42). No midline shift. No hydrocephalus. Basilar cisterns are patent. Vascular: No hyperdense vessel. Atherosclerotic calcifications are present within the cavernous internal carotid arteries. Skull: No acute fracture or focal lesion. Sinuses/Orbits: Paranasal sinuses and mastoid air cells are clear. The orbits are unremarkable. Other: None. IMPRESSION: 1. No acute intracranial abnormality. 2. Large 9 cm left anterior temporal cystic lesion likely representing an arachnoid cyst. Associated chronic mass effect on the surrounding frontal and temporal lobe. Differential diagnosis includes epidermoid cyst. Comparison with prior cross-sectional imaging would be of value to evaluate stability. Electronically Signed   By: Iven Finn M.D.   On: 12/19/2020 18:48     EKG: Independently reviewed, with result as described above.    Assessment/Plan   Wayne Walls is a 87 y.o. male with medical history significant for dementia, COPD, chronic polycythemia, glaucoma who is admitted to [facility] on 12/19/2020 with suspected acute ischemic stroke after presenting from home via EMS to [facility] for further evaluation frequent falls.    Principal Problem:   Stroke [facility]) Active Problems:   Frequent falls   Dysphagia   Polycythemia   Facial droop   Acute respiratory failure with hypoxia (HCC)    #) Right-sided facial droop: At approximately 11 AM on 12/19/2020, EMS responding to a call for assistance with the patient's frequent falls noted the patient did exhibit a right-sided facial droop of unclear timing of onset, as the  wife reports that she had not previously noted the right sided facial droop, but was not confident that she may have just been previously overlooking this finding.  There is also concern for dysphagia given the patient's voice of the patient coughing and choking when attempting to eat over the  last 3 to 4 days, reportedly representing a new finding for him.  Presented noncontrast CT of the head showed no evidence of acute intracranial process, including no evidence of acute ischemic infarct or acute intracranial hemorrhage, but showed evidence of a left anterior temporal cystic lesion associated with evidence of chronic mass-effect, as further described below.  The patient's case and imaging were discussed with the on-call neurologist, Dr. Theda Sers, who is concerned for the possibility of acute ischemic stroke, and consequently recommends admission to the hospitalist service for further evaluation and management of this possibility, including additional imaging and evaluation for potential modifiable CVA risk factors, as further described below.  Dr. Theda Sers has been consulted, and per his initial recommendations, recommends pursuing the following: MRI brain without contrast, MRA of the head and neck, echo with bubble study,TSH hemoglobin A1c, lipid panel with plan to initiate statin therapy if LDL is found to be greater than 70; initiation of dual antiplatelet therapy with daily baby aspirin as well as Plavix 75 mg p.o. daily, PT, OT, ST consultation, as well as checking of CPK.   Of note, the patient is not felt to be a candidate for tPA , as he presented outside of the window for administration as such given that he presented to [facility] emergency department at 1808 on 12/19/2020 relative to 11 AM, which represents the time of which the patient's right-sided facial droop was first noted.   The patient does not appear to possess any overt modifiable acute ischemic CVA risk factors aside from the possibility of  polycythemia, as further described above.  Specifically, he does not appear to have any documented history of diabetes, hypertension, hyperlipidemia, atrial fibrillation, and is reportedly a lifelong non-smoker.  His outpatient antiplatelet regimen leading up to today's presentation is limited to a daily baby aspirin, and he is on no additional blood thinning agents at home.  Additionally, he is not currently on any statin medication as an outpatient.  Of note, the patient received a full dose aspirin x1 in the emergency department this evening.    Plan: Nursing bedside swallow evaluation x 1 now, and will not initiate oral medications or diet until the patient has passed this. Head of the bed at 30 degrees. Neuro checks per protocol. VS per protocol. Monitor on telemetry, including monitoring for atrial fibrillation as modifiable risk factor for acute ischemic CVA. Check lipid panel in morning.  Per neurology recommendations, would consider initiation of a statin if LDL found to be greater than 70.  Check hemoglobin A1c. PT/OT consults have been ordered to occur in the morning. TTE with bubble study has been ordered for the morning to evaluate for intracardiac thrombus, septal wall aneurysm, or septal wall defect.  Additionally, given concern for dysphagia, we will keep the patient n.p.o. overnight, and speech therapy consult has been placed for the morning for formal swallow evaluation.,  Per neurology recommendations, orders been placed for MRI brain, MRA head and neck.  Per neurology recommendations, will start dual antiplatelet therapy with daily baby aspirin as well as Plavix 75 mg p.o. daily.  We will follow for final recommendations associated with neurology consultation.      #) Dysphagia: Concern for new onset dysphagia over the last 3 to 4 days, over which time the patient's wife reports that the patient has exhibited new onset coughing/choking while attempting to eat.  Given this history as  well as finding of acute hypoxic respiratory distress upon presentation, there is concern for underlying  dysphagia with possible aspiration.  Consequently, will keep n.p.o. overnight, and consult speech therapy in the morning for formal swallow evaluation.  Plan: Keep n.p.o. overnight.  ST consults were placed for the morning for formal swallow evaluation, as above.  Chest x-ray to further evaluate for underlying aspiration.  Additional work-up and management of potential presenting acute ischemic stroke, as further described above.      #) Acute hypoxic respiratory distress: In the setting of no reported baseline supplemental oxygen requirements, initial oxygen saturation in the 89% on room air, which subsequently improved to 94% 3 L nasal cannula, thereby meeting criteria for acute hypoxic respite distress as opposed to acute hypoxic respiratory failure.  Differential includes aspiration pneumonia versus aspiration pneumonitis given recent report of concern for dysphagia given new onset cough/choking with eating over the last 3 to 4 days.  Stat chest x-ray has been ordered, with results pending at this time. If CXR is suggestive of aspiration pneumonia, despite initiation of Zosyn for provision of anaerobic coverage.  Would also check blood cultures x2 prior to initiation of this antibiotic.  additionally, COVID-19 screen has been ordered, with results currently pending.  Should chest x-ray demonstrate no evidence of acute cardiopulmonary process to explain presenting acute hypoxic respiratory distress, and should COVID-19 result proved to be negative, may consider pursuing CTA of the chest to further evaluate presenting acute hypoxic respiratory distress, including evaluation for acute pulmonary embolism versus community-acquired pneumonia with initial false negative findings on chest x-ray.  ACS is felt to be less likely given EKG showing no evidence of acute ischemic changes.  Of note, if the above  evaluation is diagnostic for aspiration pneumonia, the current presentation is not consistent with sepsis in the absence of necessary SIRS criteria for establishment of such.  Plan: Work-up in management for suspected dysphagia, including n.p.o. overnight and ST consult for formal swallow evaluation in the morning.  Stat chest x-ray.  Follow-up for results of COVID-19 screening.  Consider blood gas in the morning.  Monitor on continuous pulse oximetry.  As needed albuterol inhaler.  Will attempt additional chart review to evaluate for any prior pulmonary function testing results.  Add on serum magnesium level.  Check serum phosphorus level.  Flutter valve.     #) Frequent falls: The patient's wife reports 3 to 4 days of new onset ground-level mechanical falls over that timeframe in the absence of any associated loss of consciousness.  In the context of potential similar timeframe of onset of right-sided facial droop as well as dysphagia, the differential includes acute ischemic stroke.  Patient's wife also reports progressive generalized weakness over the last few months, which may also be contributing to the patient's recent development of frequent ground-level mechanical falls, and will warrant additional evaluation, as further described below, including assessment of vitamin B12 level, TSH, and evaluation for any contribution from hypercapnia in the context of a documented history of COPD.  Plan: Review management of potential presenting acute ischemic stroke, as further described below.  Precautions ordered.  PT OT consults have been ordered for tomorrow morning.  Check MMA level to assess vitamin B12 status.  Check TSH.  Check VBG, as above.     #) Chronic polycythemia: It appears that the patient has chronically elevated hemoglobin levels in the range of 17.0-18.5 dating back to 2018, at which time it appears that he underwent evaluation that included assessment erythropoietin as well as Jak 2  mutation.  He is currently on a daily baby aspirin  at home.  Plan: We will attempt additional chart review to further evaluate the nature and conclusions associated with his prior work-up for chronic polycythemia.  Repeat CBC in the morning.  We will continue daily baby aspirin as a component of dual antiplatelet therapy as management for suspected acute ischemic infarct, as above.      #) Left anterior temporal cystic lesion: While presenting noncontrast CT of the head reportedly showed no evidence of acute intracranial process, this imaging showed evidence of a 9 cm left anterior temporal cystic lesion felt to likely represent an arachnoid cyst associated with evidence of chronic mass-effect.  Plan: Awaiting neurology evaluation and recommendations per consultation associated with suspicion of acute ischemic infarct, as above.  MRI of the brain has been ordered, with results currently pending.     #) Glaucoma: On latanoprost drops to his left eye on a nightly basis at home.  Plan: Continue latanoprost drops.    DVT prophylaxis: SCDs Code Status: Full code Disposition Plan: Per Rounding Team Consults called: On-call neurologist, Dr. Theda Sers, consulted, as above Admission status: Inpatient; admit telemetry   Of note, this patient was added by me to the following Admit List/Treatment Team:  mcadmits     PLEASE NOTE THAT DRAGON DICTATION SOFTWARE WAS USED IN THE CONSTRUCTION OF THIS NOTE.   [facility] [place] [facility] Pager [phone] From [place]  12/19/2020, 8:00 PM

## 2020-12-19 NOTE — Consult Note (Addendum)
Neurology Consult H&P  CC: Dementia and   History is obtained from: chart as nobody bedside.  HPI: Wayne Walls is a 87 y.o. male COPD, collagenous colitis, prostate cancer, dementia evaluated for right-sided facial droop and frequent falls of increasing frequency.     The following information was taken from the chart as there is no family bedside:  "Patient does have some unsteadiness but wife reports that he had a fall last night, did not have any LOC, he then had a second fall today around 0900 and again had no LOC, however shortly after that time, possibly around 10 AM the patient has had a right-sided facial droop and wife is concerned because he is having some dysphagia and inability to eat or drink. EMS was called, on arrival the patient was alert and oriented, did have some confusion with some questioning while naming objects.  He had no focal deficits in his upper or lower extremities.  On arrival he is afebrile, hemodynamically stable, point-of-care glucose is within normal limits.  He does have a slight right facial droop with loss of the nasolabial fold that is forehead sparing. He is oriented to himself, place, but not the year. He has no pronator drift, equal strength and sensation in the bilateral upper and lower extremities.  He does not have any significant evidence of trauma to the scalp, his cervical spine is nontender, he has equal strength and sensation in the upper extremities.  Given his right-sided facial droop, reported dysphagia over the last few days, and increasing difficulty of ADLs and wife concerned that he is not able to care for himself at home, we will plan to consult neurology for further stroke work-up and admit to the hospital for PT/OT, social work to discuss home health, further monitoring of his facial droop and dysphagia."  LKW: unclear tpa given?: No, mild IR Thrombectomy? No, LVO Modified Rankin Scale: 0-Completely asymptomatic and back to baseline  post- stroke NIHSS: 1  ROS: Unable to assess due to dementia and encephalopathy.  Past Medical History:  Diagnosis Date  . COPD (chronic obstructive pulmonary disease) ([place])   . Dementia ([place])   . Depression   . Osteoarthrosis, unspecified whether generalized or localized, unspecified site   . Prostate cancer ([place])   . Syncope    Carotids, cardiology  . Unspecified glaucoma(365.9)   . Unspecified sleep apnea    Chronic     Family History  Problem Relation Age of Onset  . Heart disease Mother   . Hypertension Mother   . Breast cancer Sister   . Prostate cancer Neg Hx   . Colon cancer Neg Hx   . Pancreatic cancer Neg Hx     Social History:  reports that he has never smoked. He has never used smokeless tobacco. He reports previous alcohol use. He reports that he does not use drugs.   Prior to Admission medications   Medication Sig Start Date End Date Taking? Authorizing Provider  aspirin 81 MG tablet Take 81 mg by mouth every evening.     [provider]  budesonide (ENTOCORT EC) 3 MG 24 hr capsule  01/21/19   [provider]  cholecalciferol (VITAMIN D) 1000 UNITS tablet Take 2,000 Units by mouth daily.     [provider]  donepezil (ARICEPT) 5 MG tablet Take 5 mg by mouth at bedtime. 12/05/17   [provider]  hydrocortisone 2.5 % cream Apply 1 application topically daily. 05/21/16   [provider]  ibuprofen (ADVIL,MOTRIN) 600 MG tablet TK 1 T PO Q 6 TO 8 H PRN P 11/17/18   [provider]  ketoconazole (NIZORAL) 2 % cream Apply 1 application topically daily as needed. 03/27/16   [provider]  Lactobacillus Rhamnosus, GG, (CULTURELLE) CAPS Take by mouth.    [provider]  latanoprost (XALATAN) 0.005 % ophthalmic solution Place 1 drop into the left eye at bedtime. As directed 10/15/13   [provider]  polyethylene glycol (MIRALAX) packet Take 17 g by mouth daily. 06/08/16   Mabe, Forbes Cellar,  MD  potassium chloride (K-DUR) 10 MEQ tablet TK 1 T PO D 05/06/18   [provider]    Exam: Current vital signs: BP (!) 128/58   Pulse 83   Temp 99 F (37.2 C) (Oral)   Resp 18   SpO2 (!) 89%   Physical Exam  Constitutional: Appears well-developed and well-nourished.  Psych: Affect seems appropriate to situation though cannot completely assess. Eyes: No scleral injection HENT: No OP obstrucion Head: Normocephalic.  Cardiovascular: Normal rate and regular rhythm.  Respiratory: Effort normal and breath sounds normal to anterior ascultation GI: Soft.  No distension. There is no tenderness.  Skin: WDI  Neuro: Mental Status: Patient is awake, alert, oriented to person, place.  He is aware of his memory loss. Patient is not able to give a clear and coherent history. No signs of aphasia or neglect. Cranial Nerves: II: Visual Fields are full. Pupils are equal, round, and reactive to light. III,IV, VI: EOMI without ptosis or diploplia.  V: Facial sensation is symmetric to temperature VII: Facial movement is mildly asymmetric right lower face.  VIII: hearing is intact to voice X: Uvula elevates symmetrically XI: Shoulder shrug is symmetric. XII: tongue is midline without atrophy or fasciculations.  Motor: Tone is normal. Bulk is normal. 5/5 strength was present in all four extremities. Sensory: Sensation is symmetric to light touch and temperature in the arms and legs. Deep Tendon Reflexes: 2+ and symmetric in the biceps and patellae. Plantars: Toes are downgoing bilaterally. Cerebellar: FNF and HKS are intact bilaterally.  I have reviewed labs in epic and the pertinent results are:   Ref. Range 12/19/2020 18:03  Hemoglobin Latest Ref Range: 13.0 - 17.0 g/dL 18.1 (H)  HCT Latest Ref Range: 39.0 - 52.0 % 55.6 (H)  MCV Latest Ref Range: 80.0 - 100.0 fL 101.5 (H)    Ref. Range 12/19/2020 18:03  Total Bilirubin Latest Ref Range: 0.3 - 1.2 mg/dL 1.3 (H)    I have  reviewed the images obtained: NCT head showed no acute intracranial abnormality. Large 9 cm left anterior temporal cystic lesion and smaller cystic lesion in frontal horn of left lateral ventricle. Lesions are associated with chronic mass effect on the surrounding frontal and temporal lobes.  Assessment: AADEN BUCKMAN is a 87 y.o. male right handed PMHx dementia evaluated for right-sided facial droop and frequent falls which are increasing in frequency. Though the patient reports he has memory loss, he is not able to provide history regarding his presentation. Extensive review of the chart does not provide background information regarding dementia, if there are associated behavioral symptoms or previous imaging of his brain. NCT head two left hemispheric arachnoid cysts one of which measures ~9cm with surrounding chronic mass effect. Without previous imaging to compare it is difficult to determine growth rate/lesion stability. It is certainly possible mass effect from the lesions may be impacting cognition, behavior and memory however he will  need cognitive neuropsychiatric evaluation. Though his picture in Epic shows mild right face droop, his wife stated there was new right facial weakness and he will need further evaluation for new right face droop and falls.  Recommended aspirin 324mg  now.  Impression:  Right face droop Frequent falls Dementia Large left hemispheric arachnoid cysts with associated mass effect.  Plan: - MRI brain without contrast. - Recommend vascular imaging with MRA/CTA head and neck. - Recommend TTE. - Recommend labs: HbA1c, lipid panel, TSH. - Recommend Statin if LDL > 70 - Aspirin 81mg  daily. - Clopidogrel 75mg  daily for 3 weeks. - Permissive hypertension first 24 h < 220/110.  - Telemetry monitoring for arrhythmia. - Recommend bedside Swallow screen. - Recommend Stroke education. - Recommend PT/OT/SLP consult. - Recommend cognitive neuropsychiatric evaluation. -  Routine EEG to eval for any epileptogenic discharges - Ordered.  Electronically signed by:  Lynnae Sandhoff, MD Page: [id number] 12/19/2020, 7:37 PM

## 2020-12-19 NOTE — ED Notes (Signed)
Noted Sp02 at 88%; supplemental o2 at  2L via  [place] placed,

## 2020-12-19 NOTE — ED Provider Notes (Signed)
[facility] EMERGENCY DEPARTMENT Provider Note   CSN: [id number] Arrival date & time: 12/19/20  1808     History Chief Complaint  Patient presents with  . Fall    Wayne Walls is a 87 y.o. male.  The history is provided by the patient and the EMS personnel.  Fall This is a recurrent problem. The problem occurs daily. The problem has not changed since onset.Pertinent negatives include no headaches. Nothing aggravates the symptoms. Nothing relieves the symptoms. He has tried nothing for the symptoms. The treatment provided no relief.  Neurologic Problem This is a new problem. The current episode started 6 to 12 hours ago. The problem occurs constantly. The problem has not changed since onset.Pertinent negatives include no headaches. Nothing aggravates the symptoms. Nothing relieves the symptoms. He has tried nothing for the symptoms.       Past Medical History:  Diagnosis Date  . COPD (chronic obstructive pulmonary disease) ([place])   . Dementia ([place])   . Depression   . Osteoarthrosis, unspecified whether generalized or localized, unspecified site   . Prostate cancer ([place])   . Syncope    Carotids, cardiology  . Unspecified glaucoma(365.9)   . Unspecified sleep apnea    Chronic    Patient Active Problem List   Diagnosis Date Noted  . Stroke ([place]) 12/19/2020  . Malignant neoplasm of prostate ([place]) 01/26/2019  . Elevated prostate specific antigen (PSA) 01/04/2019  . Collagenous colitis 07/23/2018  . Diverticulosis 06/09/2018  . History of adenomatous polyp of colon 06/09/2018  . Internal hemorrhoids 06/09/2018  . Melena 10/31/2017  . OSA (obstructive sleep apnea) 07/13/2015  . Syncope 10/22/2013    Past Surgical History:  Procedure Laterality Date  . HERNIA REPAIR         Family History  Problem Relation Age of Onset  . Heart disease Mother   . Hypertension Mother   . Breast cancer Sister   . Prostate cancer Neg Hx   . Colon cancer Neg Hx    . Pancreatic cancer Neg Hx     Social History   Tobacco Use  . Smoking status: Never Smoker  . Smokeless tobacco: Never Used  Vaping Use  . Vaping Use: Never used  Substance Use Topics  . Alcohol use: Not Currently  . Drug use: Never    Home Medications Prior to Admission medications   Medication Sig Start Date End Date Taking? Authorizing Provider  aspirin 81 MG tablet Take 81 mg by mouth in the morning.   Yes [provider]  donepezil (ARICEPT) 5 MG tablet Take 5 mg by mouth at bedtime. 12/05/17   [provider]  hydrocortisone 2.5 % cream Apply 1 application topically daily. 05/21/16   [provider]  ketoconazole (NIZORAL) 2 % cream Apply 1 application topically daily as needed. 03/27/16   [provider]  Lactobacillus Rhamnosus, GG, (CULTURELLE) CAPS Take by mouth.    [provider]  latanoprost (XALATAN) 0.005 % ophthalmic solution Place 1 drop into the left eye at bedtime. As directed 10/15/13   [provider]  polyethylene glycol (MIRALAX) packet Take 17 g by mouth daily. Patient not taking: Reported on 12/19/2020 06/08/16   Pixie Casino, MD  potassium chloride (K-DUR) 10 MEQ tablet TK 1 T PO D 05/06/18   [provider]    Allergies    Patient has no known allergies.  Review of Systems   Review of Systems  Unable to perform ROS: Dementia  Musculoskeletal:       Falls  Neurological: Positive for facial asymmetry. Negative for headaches.    Physical Exam Updated Vital Signs BP (!) 153/83   Pulse 82   Temp 99 F (37.2 C) (Oral)   Resp (!) 26   SpO2 (S) 94%   Physical Exam Vitals and nursing note reviewed.  Constitutional:      Appearance: He is well-developed and well-nourished.  HENT:     Head: Normocephalic and atraumatic.  Eyes:     Conjunctiva/sclera: Conjunctivae normal.  Cardiovascular:     Rate and Rhythm: Normal rate and regular rhythm.     Heart sounds: No murmur  heard.   Pulmonary:     Effort: Pulmonary effort is normal. No respiratory distress.     Breath sounds: Normal breath sounds.  Abdominal:     Palpations: Abdomen is soft.     Tenderness: There is no abdominal tenderness.  Musculoskeletal:        General: No edema.     Cervical back: Neck supple.  Skin:    General: Skin is warm and dry.  Neurological:     Mental Status: He is alert. He is disoriented.     GCS: GCS eye subscore is 4. GCS verbal subscore is 4. GCS motor subscore is 6.     Cranial Nerves: Facial asymmetry present.     Sensory: Sensation is intact.     Motor: Tremor present. No pronator drift.  Psychiatric:        Mood and Affect: Mood and affect normal.     ED Results / Procedures / Treatments   Labs (all labs ordered are listed, but only abnormal results are displayed) Labs Reviewed  CBC - Abnormal; Notable for the following components:      Result Value   Hemoglobin 18.1 (*)    HCT 55.6 (*)    MCV 101.5 (*)    Platelets 136 (*)    All other components within normal limits  DIFFERENTIAL - Abnormal; Notable for the following components:   Abs Immature Granulocytes 0.08 (*)    All other components within normal limits  COMPREHENSIVE METABOLIC PANEL - Abnormal; Notable for the following components:   Glucose, Bld 121 (*)    Total Bilirubin 1.3 (*)    All other components within normal limits  I-STAT CHEM 8, ED - Abnormal; Notable for the following components:   Glucose, Bld 119 (*)    Calcium, Ion 1.12 (*)    Hemoglobin 17.3 (*)    All other components within normal limits  SARS CORONAVIRUS 2 (TAT 6-24 HRS)  PROTIME-INR  APTT  ETHANOL  RAPID URINE DRUG SCREEN, HOSP PERFORMED  URINALYSIS, ROUTINE W REFLEX MICROSCOPIC    EKG EKG Interpretation  Date/Time:  Monday December 19 2020 18:17:23 EST Ventricular Rate:  86 PR Interval:    QRS Duration: 142 QT Interval:  399 QTC Calculation: 478 R Axis:   -71 Text Interpretation: Sinus rhythm Atrial  premature complex RBBB and LAFB Confirmed by Merrily Pew [phone]) on 12/19/2020 7:03:53 PM   Radiology CT HEAD WO CONTRAST  Result Date: 12/19/2020 CLINICAL DATA:  Neuro deficit, right facial droop and dysphagia. Status post fall EXAM: CT HEAD WITHOUT CONTRAST TECHNIQUE: Contiguous axial images were obtained from the base of the skull through the vertex without intravenous contrast. COMPARISON:  None. FINDINGS: Brain: Cerebral ventricle sizes are concordant with the degree of cerebral volume loss. Patchy and confluent areas of decreased attenuation are noted throughout the deep  and periventricular white matter of the cerebral hemispheres bilaterally, compatible with chronic microvascular ischemic disease. No evidence of large-territorial acute infarction. No parenchymal hemorrhage. No mass lesion. No extra-axial collection. Large 9 cm left anterior temporal cystic lesion similar to cerebrospinal fluid. Associated mass effect on the surrounding cerebral cortex. Another cystic lesion is noted along the frontal horn of the left lateral ventricle (5:42). No midline shift. No hydrocephalus. Basilar cisterns are patent. Vascular: No hyperdense vessel. Atherosclerotic calcifications are present within the cavernous internal carotid arteries. Skull: No acute fracture or focal lesion. Sinuses/Orbits: Paranasal sinuses and mastoid air cells are clear. The orbits are unremarkable. Other: None. IMPRESSION: 1. No acute intracranial abnormality. 2. Large 9 cm left anterior temporal cystic lesion likely representing an arachnoid cyst. Associated chronic mass effect on the surrounding frontal and temporal lobe. Differential diagnosis includes epidermoid cyst. Comparison with prior cross-sectional imaging would be of value to evaluate stability. Electronically Signed   By: Iven Finn M.D.   On: 12/19/2020 18:48    Procedures Procedures (including critical care time)  Medications Ordered in ED Medications  aspirin  tablet 325 mg (325 mg Oral Given 12/19/20 2027)    ED Course  I have reviewed the triage vital signs and the nursing notes.  Pertinent labs & imaging results that were available during my care of the patient were reviewed by me and considered in my medical decision making (see chart for details).    MDM Rules/Calculators/A&P                          This is an 87 year old male with a past medical history of COPD, Alzheimer's, collagenous colitis, prostate cancer, dementia, who presents emergency department for evaluation of a right-sided facial droop and frequent falls recently.  Patient does have some unsteadiness secondary to his Parkinson's, but wife reports that he had a fall last night, did not have any LOC, he then had a second fall today around 0900 and again had no LOC, however shortly after that time, possibly around 10 AM the patient has had a right-sided facial droop and wife is concerned because he is having some dysphagia and inability to eat or drink.  EMS was called, on arrival the patient was alert and oriented, did have some confusion with some questioning while naming objects.  He had no focal deficits in his upper or lower extremities.  On arrival he is afebrile, hemodynamically stable, point-of-care glucose is within normal limits.  He does have a slight right facial droop with loss of the nasolabial fold that is forehead sparing.  He is oriented to himself, place, but not the year.  He has no pronator drift, equal strength and sensation in the bilateral upper and lower extremities.  He does not have any significant evidence of trauma to the scalp, his cervical spine is nontender, he has equal strength and sensation in the upper extremities.  Given the objective facial droop, we will proceed with CT imaging of the head as well as work-up for metabolic causes of encephalopathy given that he is slightly confused, however it does appear that this is his baseline secondary to  Parkinson's dementia.  Patient does not meet criteria for code stroke as he does not have evidence of large vessel occlusion.  CT noncontrast of the head did not show any evidence of intracranial bleed or large vessel ischemic injury from previous stroke, did show what appears to be a likely arachnoid cyst.  Given his right-sided facial droop, reported dysphagia over the last few days, and increasing difficulty of ADLs and wife concerned that he is not able to care for himself at home, we will plan to consult neurology for further stroke work-up and admit to the hospital for PT/OT, social work to discuss home health, further monitoring of his facial droop and dysphagia.   Final Clinical Impression(s) / ED Diagnoses Final diagnoses:  Dementia without behavioral disturbance, unspecified dementia type [facility])  Facial droop    Rx / DC Orders ED Discharge Orders    None       Camila Li, MD 12/19/20 2124    Merrily Pew, MD 12/19/20 2232

## 2020-12-20 ENCOUNTER — Inpatient Hospital Stay (HOSPITAL_COMMUNITY): Payer: Medicare HMO

## 2020-12-20 ENCOUNTER — Other Ambulatory Visit (HOSPITAL_COMMUNITY): Payer: BLUE CROSS/BLUE SHIELD

## 2020-12-20 DIAGNOSIS — I6300 Cerebral infarction due to thrombosis of unspecified precerebral artery: Secondary | ICD-10-CM

## 2020-12-20 LAB — URINALYSIS, ROUTINE W REFLEX MICROSCOPIC
Bacteria, UA: NONE SEEN
Bilirubin Urine: NEGATIVE
Glucose, UA: NEGATIVE mg/dL
Ketones, ur: NEGATIVE mg/dL
Leukocytes,Ua: NEGATIVE
Nitrite: NEGATIVE
Protein, ur: 30 mg/dL — AB
Specific Gravity, Urine: 1.024 (ref 1.005–1.030)
pH: 5 (ref 5.0–8.0)

## 2020-12-20 LAB — COMPREHENSIVE METABOLIC PANEL
ALT: 22 U/L (ref 0–44)
AST: 41 U/L (ref 15–41)
Albumin: 3.6 g/dL (ref 3.5–5.0)
Alkaline Phosphatase: 98 U/L (ref 38–126)
Anion gap: 12 (ref 5–15)
BUN: 20 mg/dL (ref 8–23)
CO2: 22 mmol/L (ref 22–32)
Calcium: 8.7 mg/dL — ABNORMAL LOW (ref 8.9–10.3)
Chloride: 108 mmol/L (ref 98–111)
Creatinine, Ser: 0.91 mg/dL (ref 0.61–1.24)
GFR, Estimated: >60 mL/min (ref >60)
Glucose, Bld: 116 mg/dL — ABNORMAL HIGH (ref 70–99)
Potassium: 3.7 mmol/L (ref 3.5–5.1)
Sodium: 142 mmol/L (ref 135–145)
Total Bilirubin: 1 mg/dL (ref 0.3–1.2)
Total Protein: 6.3 g/dL — ABNORMAL LOW (ref 6.5–8.1)

## 2020-12-20 LAB — I-STAT VENOUS BLOOD GAS, ED
Acid-Base Excess: 3 mmol/L — ABNORMAL HIGH (ref 0.0–2.0)
Bicarbonate: 26.8 mmol/L (ref 20.0–28.0)
Calcium, Ion: 1.03 mmol/L — ABNORMAL LOW (ref 1.15–1.40)
HCT: 49 % (ref 39.0–52.0)
Hemoglobin: 16.7 g/dL (ref 13.0–17.0)
O2 Saturation: 77 %
Potassium: 4.2 mmol/L (ref 3.5–5.1)
Sodium: 145 mmol/L (ref 135–145)
TCO2: 28 mmol/L (ref 22–32)
pCO2, Ven: 38.5 mmHg — ABNORMAL LOW (ref 44.0–60.0)
pH, Ven: 7.451 — ABNORMAL HIGH (ref 7.250–7.430)
pO2, Ven: 39 mmHg (ref 32.0–45.0)

## 2020-12-20 LAB — CBC WITH DIFFERENTIAL/PLATELET
Abs Immature Granulocytes: 0.07 10*3/uL (ref 0.00–0.07)
Basophils Absolute: 0 10*3/uL (ref 0.0–0.1)
Basophils Relative: 0 %
Eosinophils Absolute: 0 10*3/uL (ref 0.0–0.5)
Eosinophils Relative: 0 %
HCT: 52 % (ref 39.0–52.0)
Hemoglobin: 17.9 g/dL — ABNORMAL HIGH (ref 13.0–17.0)
Immature Granulocytes: 1 %
Lymphocytes Relative: 16 %
Lymphs Abs: 0.9 10*3/uL (ref 0.7–4.0)
MCH: 34.2 pg — ABNORMAL HIGH (ref 26.0–34.0)
MCHC: 34.4 g/dL (ref 30.0–36.0)
MCV: 99.2 fL (ref 80.0–100.0)
Monocytes Absolute: 0.8 10*3/uL (ref 0.1–1.0)
Monocytes Relative: 15 %
Neutro Abs: 3.7 10*3/uL (ref 1.7–7.7)
Neutrophils Relative %: 68 %
Platelets: 119 10*3/uL — ABNORMAL LOW (ref 150–400)
RBC: 5.24 MIL/uL (ref 4.22–5.81)
RDW: 13.4 % (ref 11.5–15.5)
WBC: 5.5 10*3/uL (ref 4.0–10.5)
nRBC: 0 % (ref 0.0–0.2)

## 2020-12-20 LAB — FIBRINOGEN: Fibrinogen: 418 mg/dL (ref 210–475)

## 2020-12-20 LAB — LIPID PANEL
Cholesterol: 170 mg/dL (ref 0–200)
HDL: 41 mg/dL (ref >40)
LDL Cholesterol: 114 mg/dL — ABNORMAL HIGH (ref 0–99)
Total CHOL/HDL Ratio: 4.1 RATIO
Triglycerides: 75 mg/dL (ref <150)
VLDL: 15 mg/dL (ref 0–40)

## 2020-12-20 LAB — TSH: TSH: 2.097 u[IU]/mL (ref 0.350–4.500)

## 2020-12-20 LAB — SARS CORONAVIRUS 2 (TAT 6-24 HRS): SARS Coronavirus 2: POSITIVE — AB

## 2020-12-20 LAB — PHOSPHORUS: Phosphorus: 3.5 mg/dL (ref 2.5–4.6)

## 2020-12-20 LAB — RAPID URINE DRUG SCREEN, HOSP PERFORMED
Amphetamines: NOT DETECTED
Barbiturates: NOT DETECTED
Benzodiazepines: NOT DETECTED
Cocaine: NOT DETECTED
Opiates: NOT DETECTED
Tetrahydrocannabinol: NOT DETECTED

## 2020-12-20 LAB — HEMOGLOBIN A1C
Hgb A1c MFr Bld: 5.7 % — ABNORMAL HIGH (ref 4.8–5.6)
Mean Plasma Glucose: 116.89 mg/dL

## 2020-12-20 LAB — FERRITIN: Ferritin: 676 ng/mL — ABNORMAL HIGH (ref 24–336)

## 2020-12-20 LAB — CK: Total CK: 552 U/L — ABNORMAL HIGH (ref 49–397)

## 2020-12-20 LAB — C-REACTIVE PROTEIN: CRP: 1.4 mg/dL — ABNORMAL HIGH (ref <1.0)

## 2020-12-20 LAB — LACTATE DEHYDROGENASE: LDH: 238 U/L — ABNORMAL HIGH (ref 98–192)

## 2020-12-20 LAB — MAGNESIUM: Magnesium: 2.2 mg/dL (ref 1.7–2.4)

## 2020-12-20 LAB — PROCALCITONIN: Procalcitonin: <0.1 ng/mL

## 2020-12-20 IMAGING — MR MR MRA NECK W/O CM
5 series · 34 of 48 positions shown · non-contrast
Comparison: Prior CT from [DATE].

CLINICAL DATA: Initial evaluation for acute TIA, right-sided facial
droop, falls.



[Series 23: tof_fl3d_tra_iso · axial · 0.6mm · 0.52mm/px · z∈[-185,-106]mm · 15 of 133 slices shown (1 of 2)]
[im 1/133]
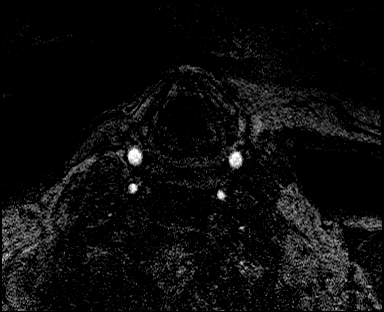
[im 9/133]
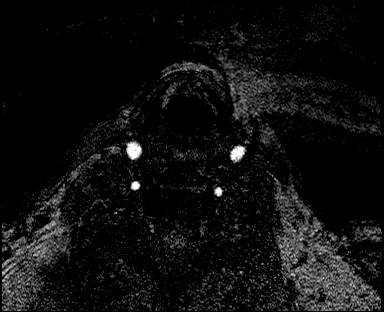
[im 18/133]
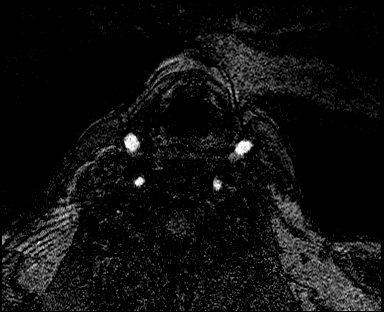
[im 27/133]
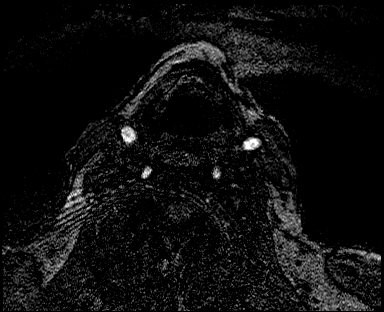
[im 36/133]
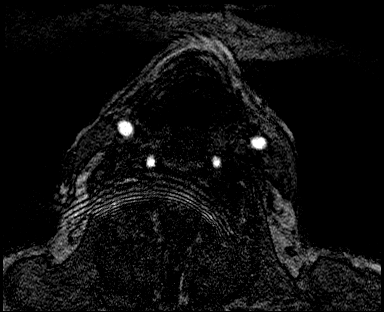
[im 45/133]
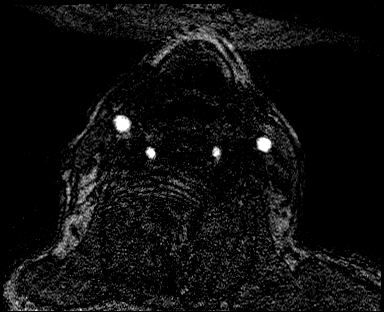
[im 53/133]
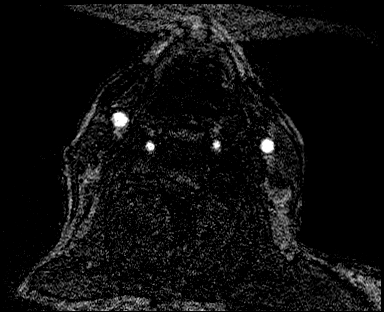
[im 62/133]
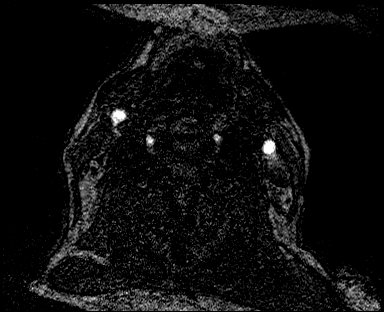
[im 71/133]
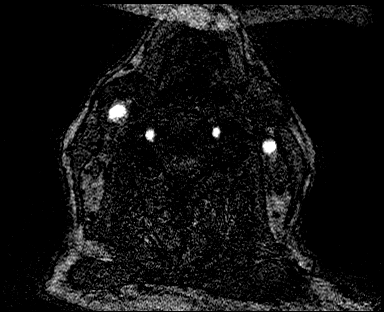
[im 80/133]
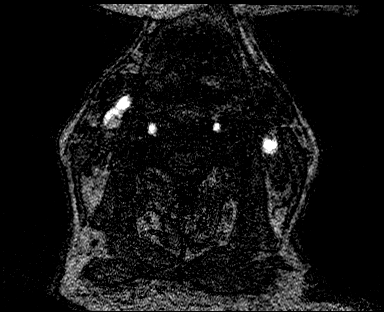
[im 89/133]
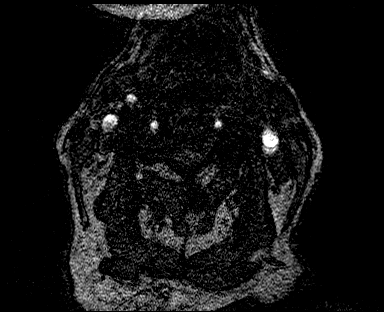
[im 97/133]
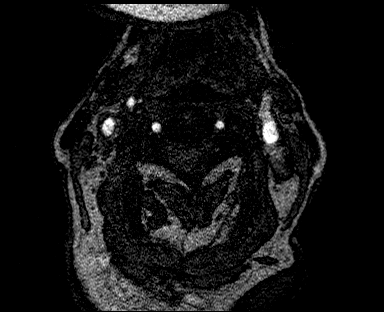
[im 106/133]
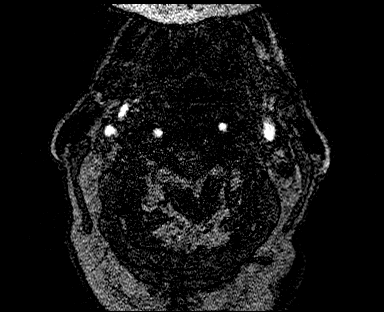
[im 115/133]
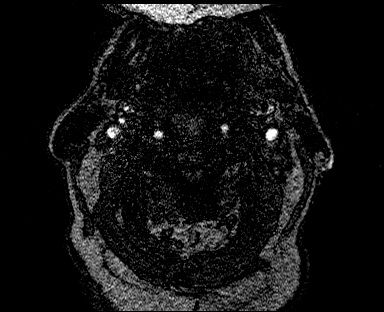
[im 133/133]
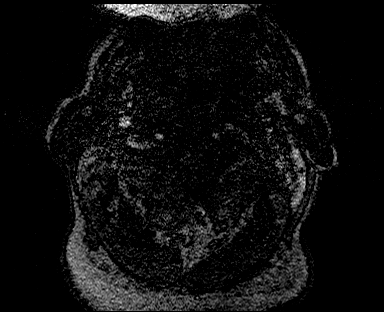

[Series 26: tof_fl2d_tra · axial · 3.0mm · 0.78mm/px · z∈[-281,-63]mm · 9 of 105 slices shown]
[im 1/105]
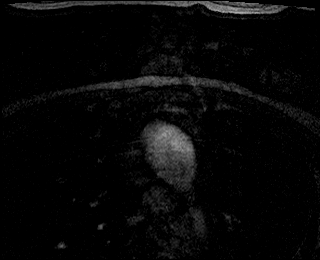
[im 18/105]
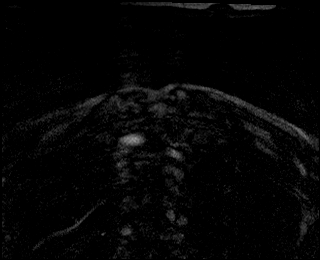
[im 35/105]
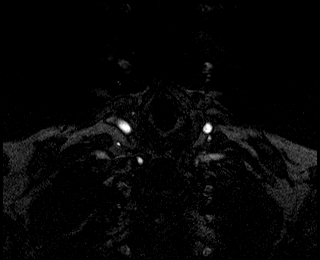
[im 44/105]
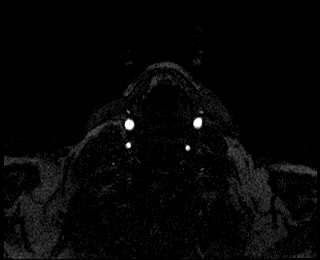
[im 53/105]
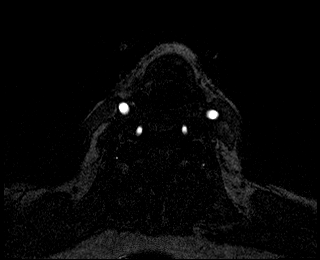
[im 61/105]
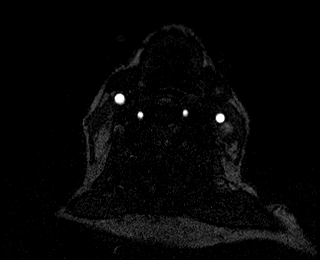
[im 70/105]
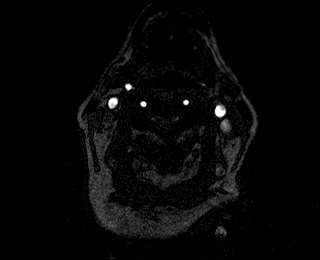
[im 87/105]
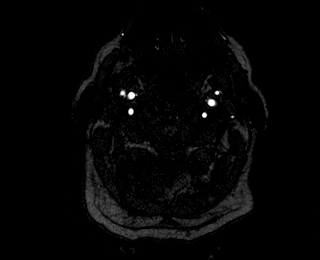
[im 105/105]
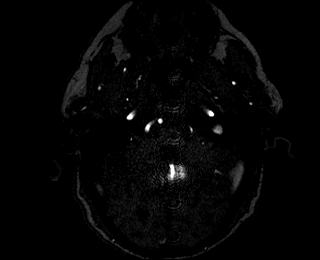

[Series 29: tof_fl2d_tra_mip_tra · axial · 221.4mm · 0.78mm/px · 1 of 1 slices shown]
[im 1/1]
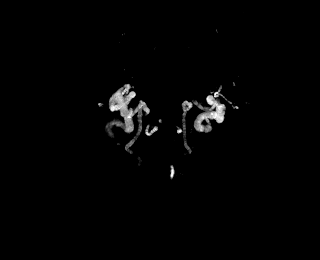

[Series 30: tof_fl2d_tra_mip_radials · oblique · 0.78mm/px · 1 of 4 slices shown]
[im 1/4]
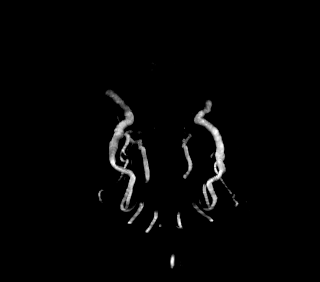

[Series 31: tof_fl3d_tra_iso · axial · 0.6mm · 0.52mm/px · z∈[-200,-131]mm · 8 of 133 slices shown (2 of 2)]
[im 9/133]
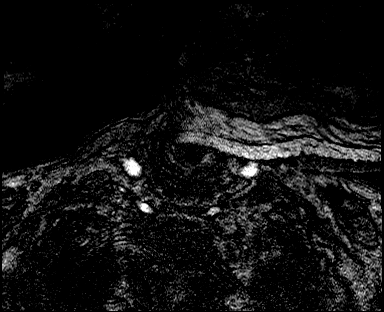
[im 25/133]
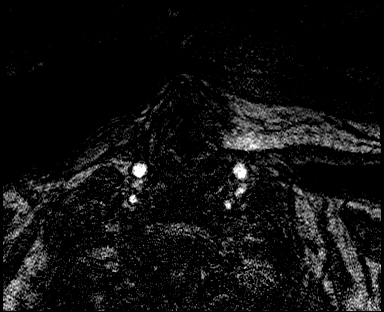
[im 42/133]
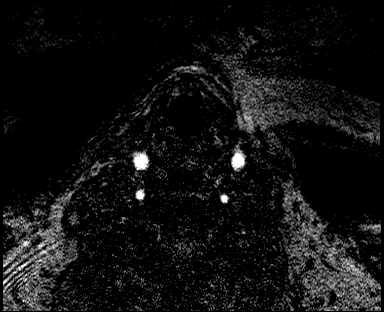
[im 58/133]
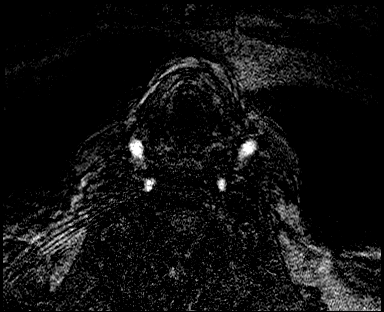
[im 75/133]
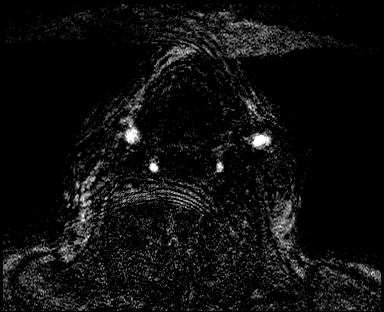
[im 91/133]
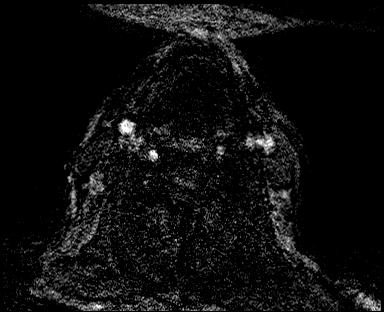
[im 108/133]
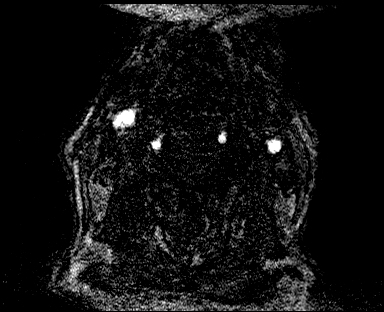
[im 124/133]
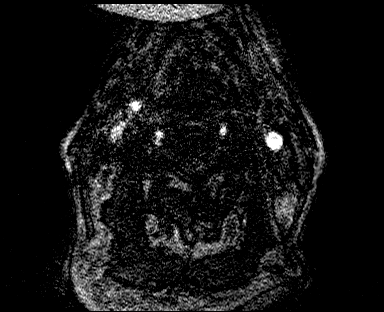

[34 of 48 positions shown; findings below may reference images not displayed]

FINDINGS: MRI HEAD FINDINGS

Brain: Diffuse prominence of the CSF containing spaces compatible
with generalized age-related cerebral atrophy. Patchy T2/FLAIR
hyperintensity within the periventricular and deep white matter both
cerebral hemispheres consistent with chronic small vessel ischemic
disease, mild in nature. Patchy involvement of the pons noted.
Superimposed remote hemorrhagic lacunar infarct present at the left
basal ganglia. Few additional small remote left basal ganglia and
left thalamic lacunar infarcts noted.

9 mm focus of mild diffusion signal abnormality seen involving the
subcortical and deep white matter of the posterior left frontal
centrum semi ovale (series 5, image 94). No discernible associated
ADC correlate. Finding likely reflects a small focus of subacute
small vessel ischemia. No associated hemorrhage or mass effect.

No other diffusion abnormality to suggest acute or subacute
ischemia. Gray-white matter differentiation maintained. No other
areas of remote cortical infarction. No other foci of susceptibility
artifact to suggest acute or chronic intracranial hemorrhage.

Well-circumscribed cystic lesion measuring 7.3 x 5.0 x 6.9 cm seen
at the left frontotemporal region. This follows CSF signal intensity
on all pulse sequences, consistent with a benign arachnoid cyst.
Associated mild regional mass effect with trace 2-3 mm of
left-to-right shift. No hydrocephalus or ventricular trapping. No
other mass lesion or mass effect. No extra-axial fluid collection.
Pituitary gland and suprasellar region normal. Midline structures
intact.

Vascular: Major intracranial vascular flow voids are maintained.

Skull and upper cervical spine: Craniocervical junction within
normal limits. Bone marrow signal intensity normal. No scalp soft
tissue abnormality.

Sinuses/Orbits: Globes and orbital soft tissues within normal
limits. Scattered mucosal thickening noted within the ethmoidal air
cells and maxillary sinuses. Paranasal sinuses are otherwise clear.
Trace fluid signal intensity noted within the mastoid air cells
bilaterally, of doubtful significance. Inner ear structures grossly
within normal limits.

Other: None.

MRA HEAD FINDINGS

ANTERIOR CIRCULATION:

Visualized distal cervical segments of the internal carotid arteries
are widely patent with antegrade flow. Petrous, cavernous, and
supraclinoid segments widely patent without stenosis or other
abnormality. A1 segments patent bilaterally. Normal anterior
communicating artery complex. Anterior cerebral arteries widely
patent to their distal aspects. No M1 stenosis or occlusion. Normal
MCA bifurcations. Distal MCA branches well perfused bilaterally.
Mass effect with superior displacement of the left MCA by the large
left-sided arachnoid cyst noted.

POSTERIOR CIRCULATION:

Both V4 segments widely patent to the vertebrobasilar junction. Both
PICA origins patent and normal. Basilar widely patent to its distal
aspect. Superior cerebellar arteries patent bilaterally. Both PCAs
supplied via the basilar well perfused to their distal aspects.

No intracranial aneurysm.

MRA NECK FINDINGS

AORTIC ARCH: Examination technically limited by lack of IV contrast.

Visualized aortic arch of normal caliber with normal 3 vessel
morphology. No hemodynamically significant stenosis seen about the
origin of the great vessels.

RIGHT CAROTID SYSTEM: Right CCA patent from its origin to the
bifurcation without stenosis. Mild for age atheromatous irregularity
about the right bifurcation/proximal right ICA without
hemodynamically significant stenosis. Right ICA widely patent
distally without stenosis, evidence for dissection, or occlusion.

LEFT CAROTID SYSTEM: Left CCA patent from its origin to the
bifurcation without stenosis. Mild for age atheromatous irregularity
about the left bifurcation/proximal left ICA without significant
stenosis. Left ICA patent distally without stenosis, evidence for
dissection, or occlusion.

VERTEBRAL ARTERIES: Both vertebral arteries appear to arise from the
subclavian arteries. No visible proximal subclavian artery stenosis.
Proximal left V1 segment not well seen due to tortuosity and lack of
IV contrast. Visualized portions of the vertebral arteries are
patent within the neck without stenosis, evidence for dissection or
occlusion.
IMPRESSION: MRI HEAD IMPRESSION:

1. 9 mm focus of mild diffusion abnormality involving the
subcortical/deep white matter of the posterior left frontal centrum
semi ovale, most likely reflecting a small focus of subacute small
vessel ischemia. No associated hemorrhage or mass effect.
2. No other acute intracranial abnormality.
3. Underlying age-related cerebral atrophy with mild chronic small
vessel ischemic disease, with remote lacunar infarcts involving the
left basal ganglia and left thalamus.
4. 7.3 x 5.0 x 6.9 cm benign arachnoid cyst at the left
frontotemporal region with associated regional mass effect and trace
2-3 mm of left-to-right shift.

MRA HEAD IMPRESSION:

Negative intracranial MRA. No large vessel occlusion. No
hemodynamically significant or correctable stenosis.

MRA NECK IMPRESSION:

Negative MRA of the neck. No hemodynamically significant or critical
flow limiting stenosis within the neck.

## 2020-12-20 IMAGING — MR MR HEAD W/O CM
13 of 14 series · 39 of 48 positions shown · non-contrast
Comparison: Prior CT from [DATE].

CLINICAL DATA: Initial evaluation for acute TIA, right-sided facial
droop, falls.



[Series 5: DWI · axial · 3.0mm · 0.88mm/px · z∈[-31,+112]mm · 5 of 108 slices shown (1 of 4)]
[im 1/108]
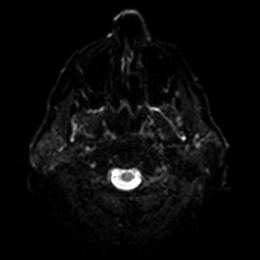
[im 27/108]
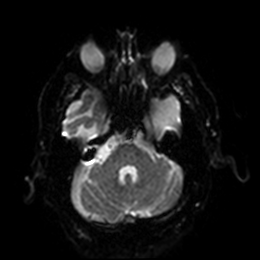
[im 54/108]
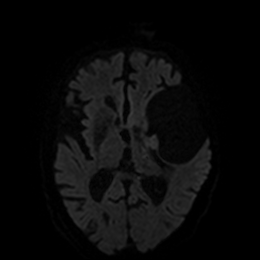
[im 81/108]
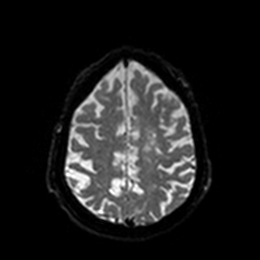
[im 108/108]
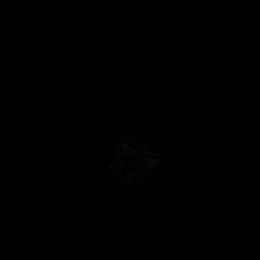

[Series 6: DWI · axial · 3.0mm · 0.88mm/px · z∈[-31,+112]mm · 3 of 54 slices shown (2 of 4)]
[im 1/54]
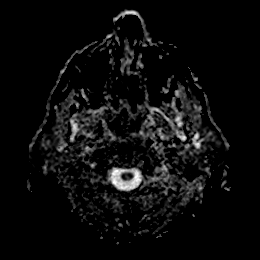
[im 27/54]
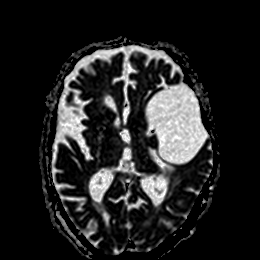
[im 54/54]
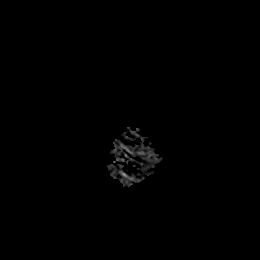

[Series 7: DWI · coronal · 4.0mm · 0.88mm/px · 4 of 76 slices shown (3 of 4)]
[im 1/76]
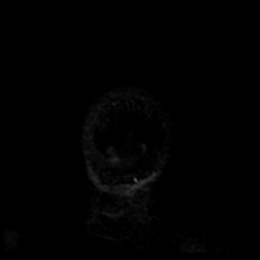
[im 26/76]
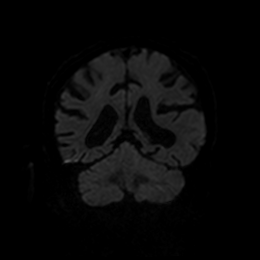
[im 51/76]
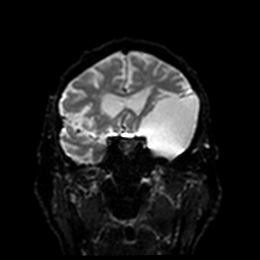
[im 76/76]
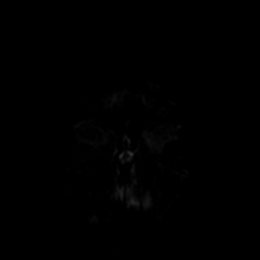

[Series 8: DWI · coronal · 4.0mm · 0.88mm/px · 2 of 38 slices shown (4 of 4)]
[im 1/38]
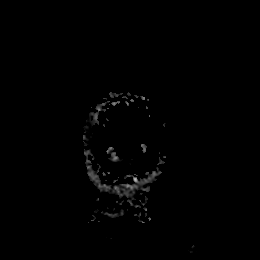
[im 38/38]
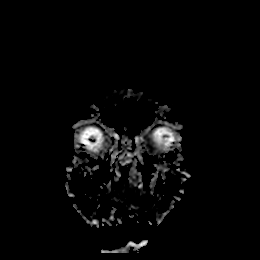

[Series 9: T1 · sagittal · 5.0mm · 0.75mm/px · 2 of 26 slices shown]
[im 1/26]
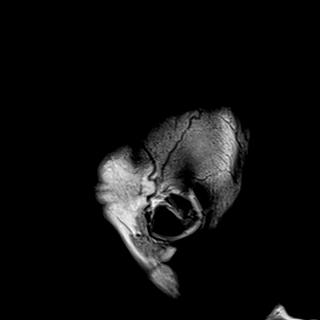
[im 26/26]
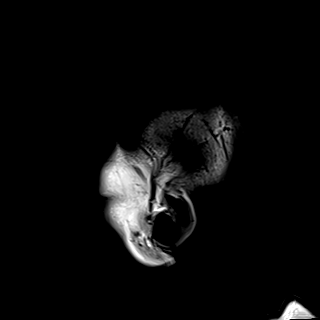

[Series 10: T2 · axial · 5.0mm · 0.72mm/px · z∈[-30,+111]mm · 2 of 27 slices shown (1 of 2)]
[im 1/27]
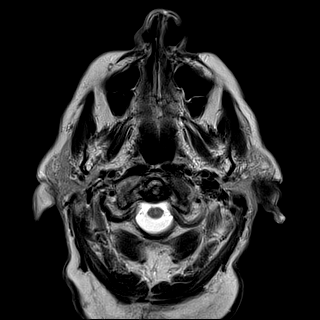
[im 27/27]
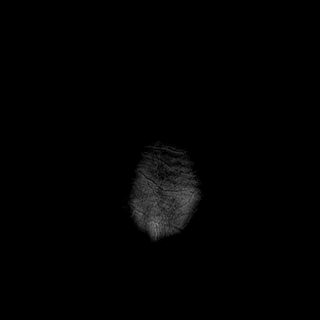

[Series 11: FLAIR · axial · 5.0mm · 0.45mm/px · z∈[-28,+113]mm · 2 of 27 slices shown]
[im 1/27]
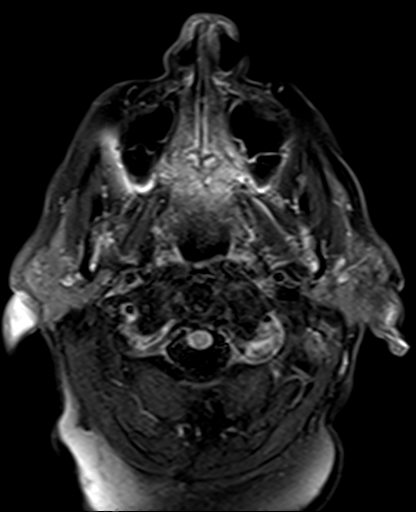
[im 27/27]
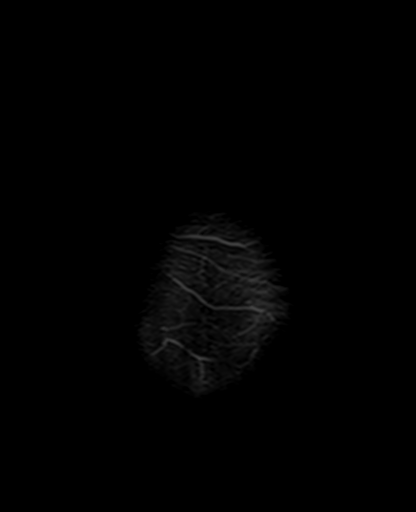

[Series 12: mag_images · axial · 3.0mm · 0.90mm/px · z∈[-37,+122]mm · 4 of 60 slices shown]
[im 1/60]
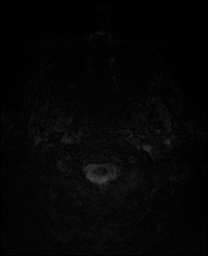
[im 20/60]
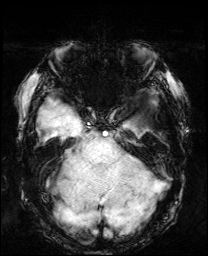
[im 40/60]
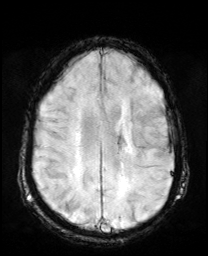
[im 60/60]
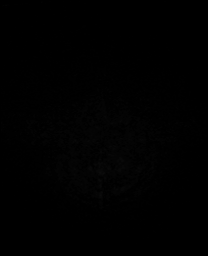

[Series 13: pha_images · axial · 3.0mm · 0.90mm/px · z∈[-37,+119]mm · 3 of 58 slices shown]
[im 1/58]
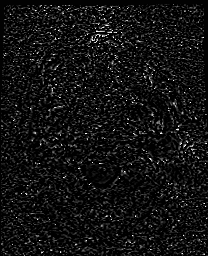
[im 29/58]
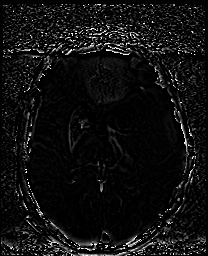
[im 58/58]
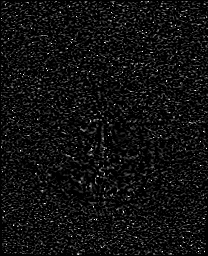

[Series 14: swi_images · axial · 3.0mm · 0.90mm/px · z∈[-37,+122]mm · 4 of 60 slices shown]
[im 1/60]
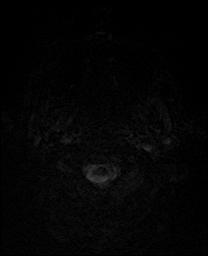
[im 20/60]
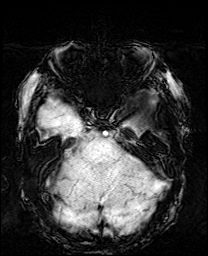
[im 40/60]
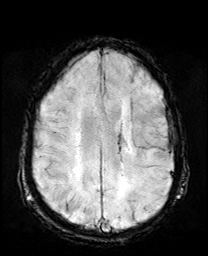
[im 60/60]
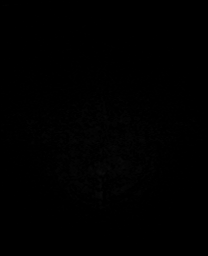

[Series 15: mip_images(sw) · axial · 24.0mm · 0.90mm/px · z∈[-28,+113]mm · 3 of 53 slices shown]
[im 1/53]
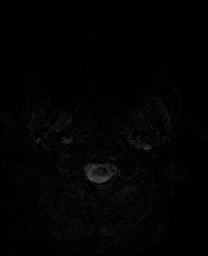
[im 27/53]
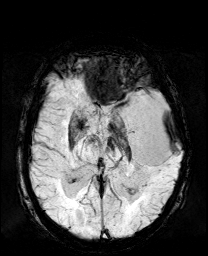
[im 53/53]
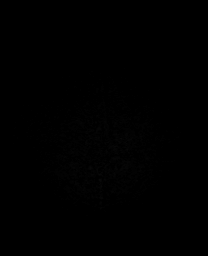

[Series 17: T2 · coronal · 5.0mm · 0.34mm/px · 2 of 31 slices shown (2 of 2)]
[im 1/31]
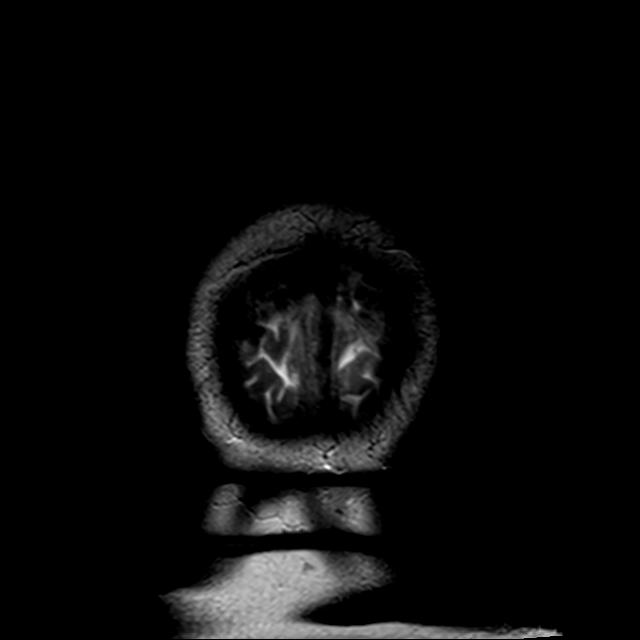
[im 31/31]
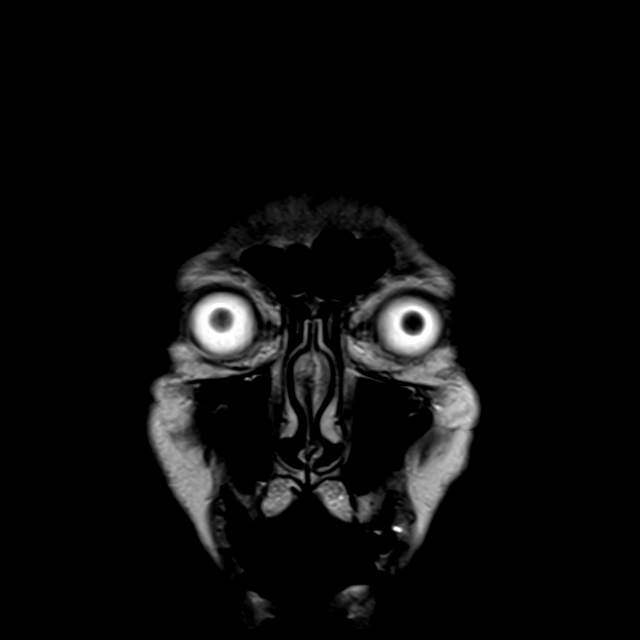

[Series 31: tof_fl3d_tra_iso · axial · 0.6mm · 0.52mm/px · z∈[-205,-183]mm · 3 of 133 slices shown]
[im 1/133]
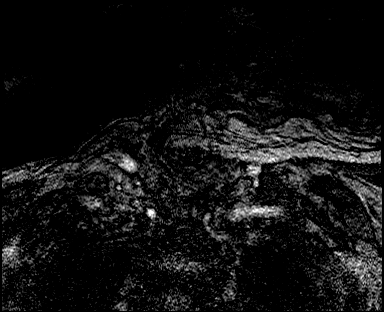
[im 19/133]
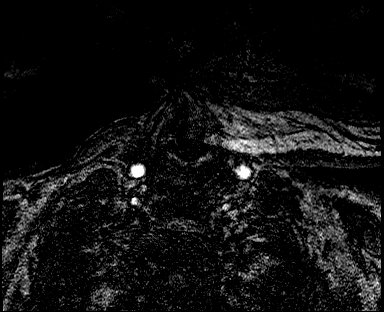
[im 38/133]
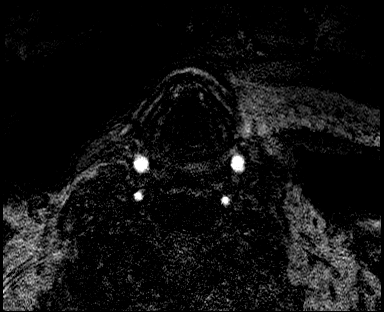

[39 of 48 positions shown; findings below may reference images not displayed]

FINDINGS: MRI HEAD FINDINGS

Brain: Diffuse prominence of the CSF containing spaces compatible
with generalized age-related cerebral atrophy. Patchy T2/FLAIR
hyperintensity within the periventricular and deep white matter both
cerebral hemispheres consistent with chronic small vessel ischemic
disease, mild in nature. Patchy involvement of the pons noted.
Superimposed remote hemorrhagic lacunar infarct present at the left
basal ganglia. Few additional small remote left basal ganglia and
left thalamic lacunar infarcts noted.

9 mm focus of mild diffusion signal abnormality seen involving the
subcortical and deep white matter of the posterior left frontal
centrum semi ovale (series 5, image 94). No discernible associated
ADC correlate. Finding likely reflects a small focus of subacute
small vessel ischemia. No associated hemorrhage or mass effect.

No other diffusion abnormality to suggest acute or subacute
ischemia. Gray-white matter differentiation maintained. No other
areas of remote cortical infarction. No other foci of susceptibility
artifact to suggest acute or chronic intracranial hemorrhage.

Well-circumscribed cystic lesion measuring 7.3 x 5.0 x 6.9 cm seen
at the left frontotemporal region. This follows CSF signal intensity
on all pulse sequences, consistent with a benign arachnoid cyst.
Associated mild regional mass effect with trace 2-3 mm of
left-to-right shift. No hydrocephalus or ventricular trapping. No
other mass lesion or mass effect. No extra-axial fluid collection.
Pituitary gland and suprasellar region normal. Midline structures
intact.

Vascular: Major intracranial vascular flow voids are maintained.

Skull and upper cervical spine: Craniocervical junction within
normal limits. Bone marrow signal intensity normal. No scalp soft
tissue abnormality.

Sinuses/Orbits: Globes and orbital soft tissues within normal
limits. Scattered mucosal thickening noted within the ethmoidal air
cells and maxillary sinuses. Paranasal sinuses are otherwise clear.
Trace fluid signal intensity noted within the mastoid air cells
bilaterally, of doubtful significance. Inner ear structures grossly
within normal limits.

Other: None.

MRA HEAD FINDINGS

ANTERIOR CIRCULATION:

Visualized distal cervical segments of the internal carotid arteries
are widely patent with antegrade flow. Petrous, cavernous, and
supraclinoid segments widely patent without stenosis or other
abnormality. A1 segments patent bilaterally. Normal anterior
communicating artery complex. Anterior cerebral arteries widely
patent to their distal aspects. No M1 stenosis or occlusion. Normal
MCA bifurcations. Distal MCA branches well perfused bilaterally.
Mass effect with superior displacement of the left MCA by the large
left-sided arachnoid cyst noted.

POSTERIOR CIRCULATION:

Both V4 segments widely patent to the vertebrobasilar junction. Both
PICA origins patent and normal. Basilar widely patent to its distal
aspect. Superior cerebellar arteries patent bilaterally. Both PCAs
supplied via the basilar well perfused to their distal aspects.

No intracranial aneurysm.

MRA NECK FINDINGS

AORTIC ARCH: Examination technically limited by lack of IV contrast.

Visualized aortic arch of normal caliber with normal 3 vessel
morphology. No hemodynamically significant stenosis seen about the
origin of the great vessels.

RIGHT CAROTID SYSTEM: Right CCA patent from its origin to the
bifurcation without stenosis. Mild for age atheromatous irregularity
about the right bifurcation/proximal right ICA without
hemodynamically significant stenosis. Right ICA widely patent
distally without stenosis, evidence for dissection, or occlusion.

LEFT CAROTID SYSTEM: Left CCA patent from its origin to the
bifurcation without stenosis. Mild for age atheromatous irregularity
about the left bifurcation/proximal left ICA without significant
stenosis. Left ICA patent distally without stenosis, evidence for
dissection, or occlusion.

VERTEBRAL ARTERIES: Both vertebral arteries appear to arise from the
subclavian arteries. No visible proximal subclavian artery stenosis.
Proximal left V1 segment not well seen due to tortuosity and lack of
IV contrast. Visualized portions of the vertebral arteries are
patent within the neck without stenosis, evidence for dissection or
occlusion.
IMPRESSION: MRI HEAD IMPRESSION:

1. 9 mm focus of mild diffusion abnormality involving the
subcortical/deep white matter of the posterior left frontal centrum
semi ovale, most likely reflecting a small focus of subacute small
vessel ischemia. No associated hemorrhage or mass effect.
2. No other acute intracranial abnormality.
3. Underlying age-related cerebral atrophy with mild chronic small
vessel ischemic disease, with remote lacunar infarcts involving the
left basal ganglia and left thalamus.
4. 7.3 x 5.0 x 6.9 cm benign arachnoid cyst at the left
frontotemporal region with associated regional mass effect and trace
2-3 mm of left-to-right shift.

MRA HEAD IMPRESSION:

Negative intracranial MRA. No large vessel occlusion. No
hemodynamically significant or correctable stenosis.

MRA NECK IMPRESSION:

Negative MRA of the neck. No hemodynamically significant or critical
flow limiting stenosis within the neck.

## 2020-12-20 IMAGING — MR MR MRA HEAD W/O CM
2 series · 15 of 48 positions shown · non-contrast
Comparison: Prior CT from [DATE].

CLINICAL DATA: Initial evaluation for acute TIA, right-sided facial
droop, falls.



[Series 5: 3d cow · axial · 0.5mm · 0.41mm/px · z∈[-27,+46]mm · 14 of 172 slices shown]
[im 1/172]
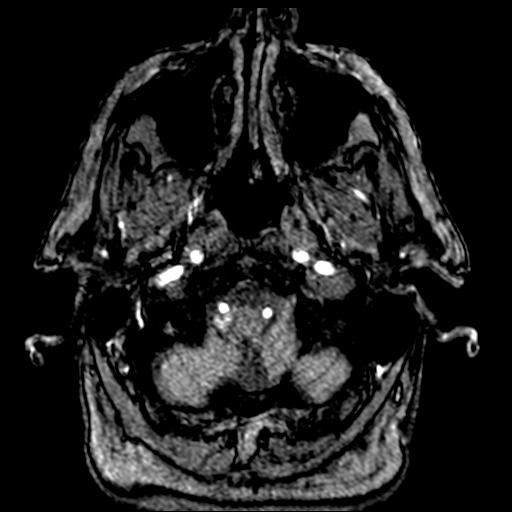
[im 4/172]
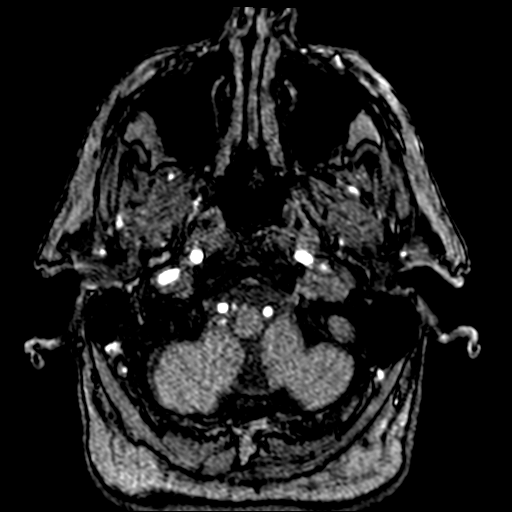
[im 8/172]
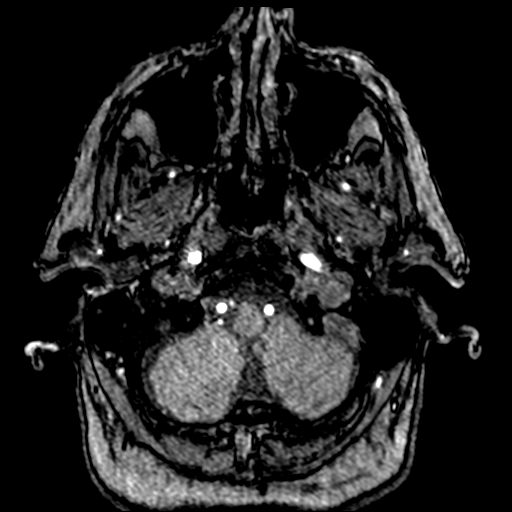
[im 12/172]
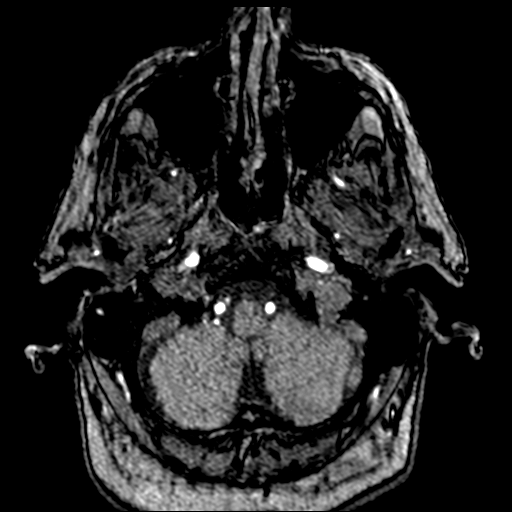
[im 27/172]
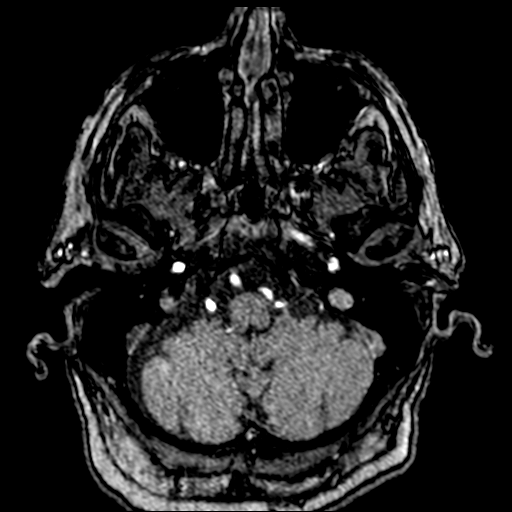
[im 30/172]
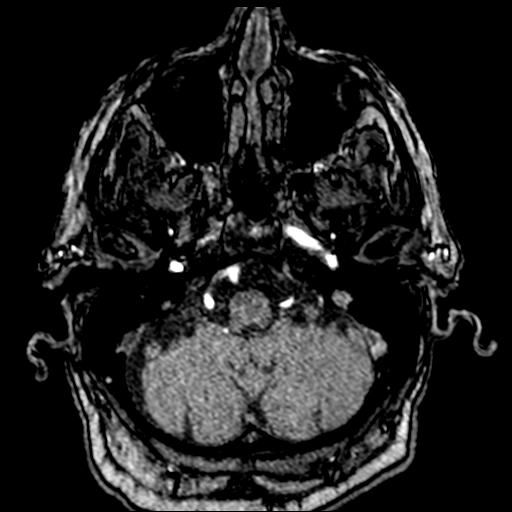
[im 53/172]
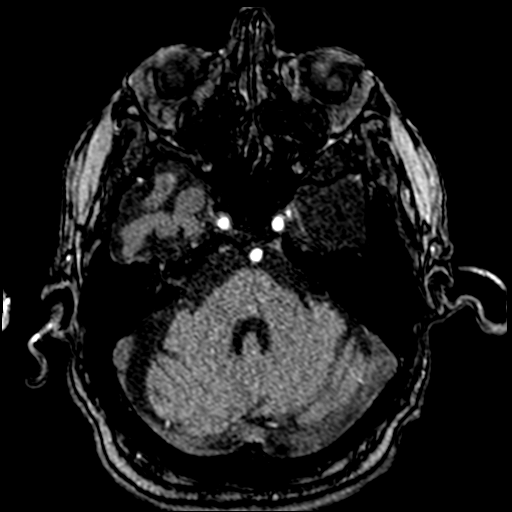
[im 75/172]
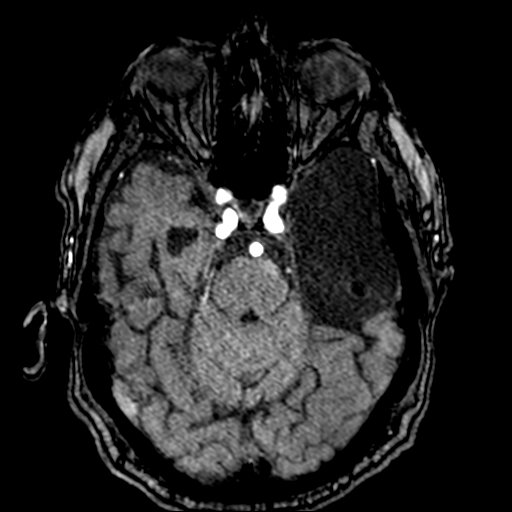
[im 86/172]
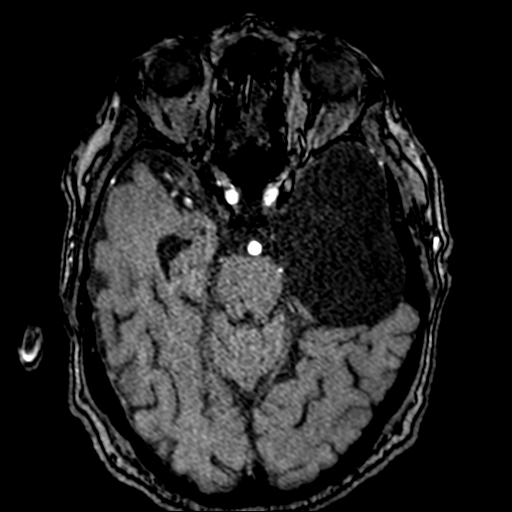
[im 97/172]
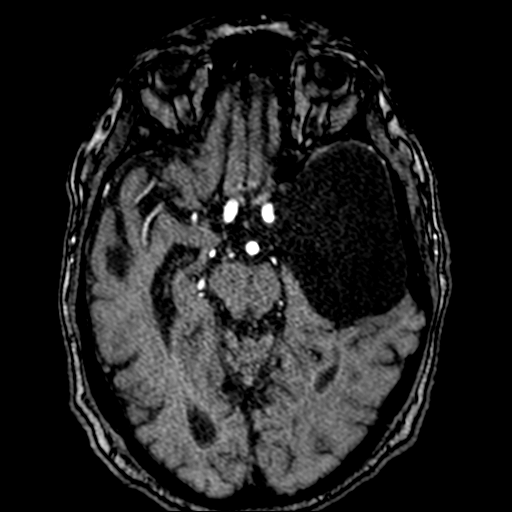
[im 119/172]
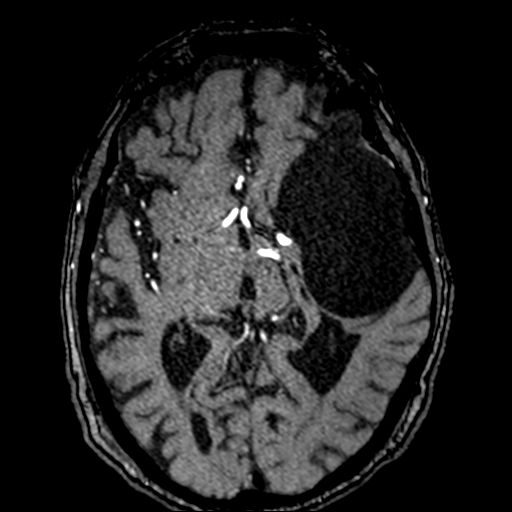
[im 142/172]
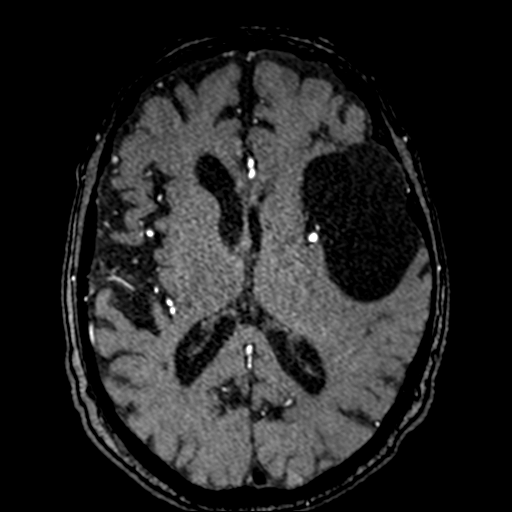
[im 145/172]
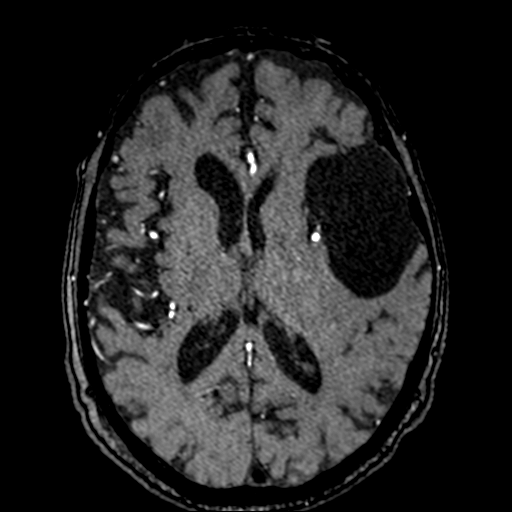
[im 164/172]
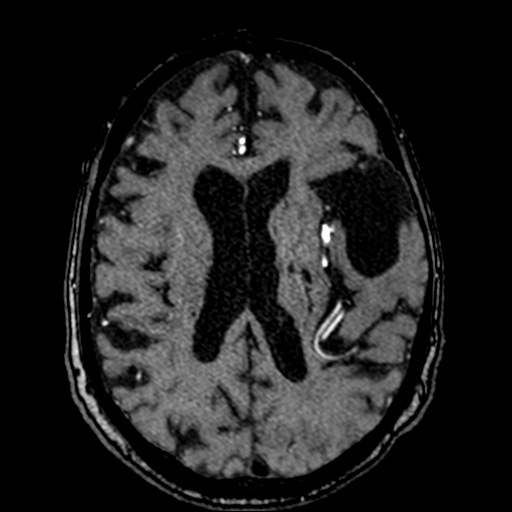

[Series 8: 3d cow_mip_tra · axial · 86.0mm · 0.41mm/px · 1 of 1 slices shown]
[im 1/1]
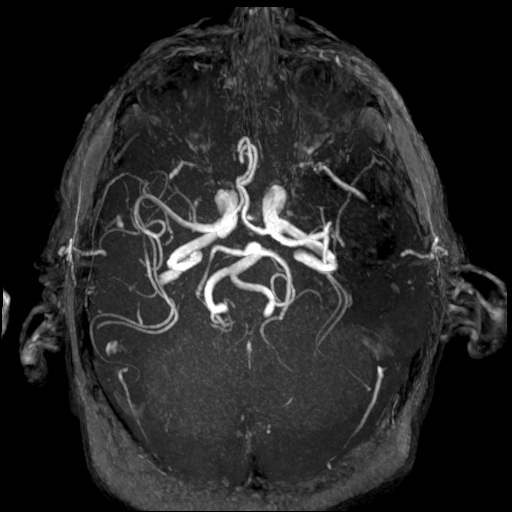

[15 of 48 positions shown; findings below may reference images not displayed]

FINDINGS: MRI HEAD FINDINGS

Brain: Diffuse prominence of the CSF containing spaces compatible
with generalized age-related cerebral atrophy. Patchy T2/FLAIR
hyperintensity within the periventricular and deep white matter both
cerebral hemispheres consistent with chronic small vessel ischemic
disease, mild in nature. Patchy involvement of the pons noted.
Superimposed remote hemorrhagic lacunar infarct present at the left
basal ganglia. Few additional small remote left basal ganglia and
left thalamic lacunar infarcts noted.

9 mm focus of mild diffusion signal abnormality seen involving the
subcortical and deep white matter of the posterior left frontal
centrum semi ovale (series 5, image 94). No discernible associated
ADC correlate. Finding likely reflects a small focus of subacute
small vessel ischemia. No associated hemorrhage or mass effect.

No other diffusion abnormality to suggest acute or subacute
ischemia. Gray-white matter differentiation maintained. No other
areas of remote cortical infarction. No other foci of susceptibility
artifact to suggest acute or chronic intracranial hemorrhage.

Well-circumscribed cystic lesion measuring 7.3 x 5.0 x 6.9 cm seen
at the left frontotemporal region. This follows CSF signal intensity
on all pulse sequences, consistent with a benign arachnoid cyst.
Associated mild regional mass effect with trace 2-3 mm of
left-to-right shift. No hydrocephalus or ventricular trapping. No
other mass lesion or mass effect. No extra-axial fluid collection.
Pituitary gland and suprasellar region normal. Midline structures
intact.

Vascular: Major intracranial vascular flow voids are maintained.

Skull and upper cervical spine: Craniocervical junction within
normal limits. Bone marrow signal intensity normal. No scalp soft
tissue abnormality.

Sinuses/Orbits: Globes and orbital soft tissues within normal
limits. Scattered mucosal thickening noted within the ethmoidal air
cells and maxillary sinuses. Paranasal sinuses are otherwise clear.
Trace fluid signal intensity noted within the mastoid air cells
bilaterally, of doubtful significance. Inner ear structures grossly
within normal limits.

Other: None.

MRA HEAD FINDINGS

ANTERIOR CIRCULATION:

Visualized distal cervical segments of the internal carotid arteries
are widely patent with antegrade flow. Petrous, cavernous, and
supraclinoid segments widely patent without stenosis or other
abnormality. A1 segments patent bilaterally. Normal anterior
communicating artery complex. Anterior cerebral arteries widely
patent to their distal aspects. No M1 stenosis or occlusion. Normal
MCA bifurcations. Distal MCA branches well perfused bilaterally.
Mass effect with superior displacement of the left MCA by the large
left-sided arachnoid cyst noted.

POSTERIOR CIRCULATION:

Both V4 segments widely patent to the vertebrobasilar junction. Both
PICA origins patent and normal. Basilar widely patent to its distal
aspect. Superior cerebellar arteries patent bilaterally. Both PCAs
supplied via the basilar well perfused to their distal aspects.

No intracranial aneurysm.

MRA NECK FINDINGS

AORTIC ARCH: Examination technically limited by lack of IV contrast.

Visualized aortic arch of normal caliber with normal 3 vessel
morphology. No hemodynamically significant stenosis seen about the
origin of the great vessels.

RIGHT CAROTID SYSTEM: Right CCA patent from its origin to the
bifurcation without stenosis. Mild for age atheromatous irregularity
about the right bifurcation/proximal right ICA without
hemodynamically significant stenosis. Right ICA widely patent
distally without stenosis, evidence for dissection, or occlusion.

LEFT CAROTID SYSTEM: Left CCA patent from its origin to the
bifurcation without stenosis. Mild for age atheromatous irregularity
about the left bifurcation/proximal left ICA without significant
stenosis. Left ICA patent distally without stenosis, evidence for
dissection, or occlusion.

VERTEBRAL ARTERIES: Both vertebral arteries appear to arise from the
subclavian arteries. No visible proximal subclavian artery stenosis.
Proximal left V1 segment not well seen due to tortuosity and lack of
IV contrast. Visualized portions of the vertebral arteries are
patent within the neck without stenosis, evidence for dissection or
occlusion.
IMPRESSION: MRI HEAD IMPRESSION:

1. 9 mm focus of mild diffusion abnormality involving the
subcortical/deep white matter of the posterior left frontal centrum
semi ovale, most likely reflecting a small focus of subacute small
vessel ischemia. No associated hemorrhage or mass effect.
2. No other acute intracranial abnormality.
3. Underlying age-related cerebral atrophy with mild chronic small
vessel ischemic disease, with remote lacunar infarcts involving the
left basal ganglia and left thalamus.
4. 7.3 x 5.0 x 6.9 cm benign arachnoid cyst at the left
frontotemporal region with associated regional mass effect and trace
2-3 mm of left-to-right shift.

MRA HEAD IMPRESSION:

Negative intracranial MRA. No large vessel occlusion. No
hemodynamically significant or correctable stenosis.

MRA NECK IMPRESSION:

Negative MRA of the neck. No hemodynamically significant or critical
flow limiting stenosis within the neck.

## 2020-12-20 MED ORDER — SODIUM CHLORIDE 0.9 % IV SOLN
200.0000 mg | Freq: Once | INTRAVENOUS | Status: AC
  Administered 2020-12-20: 200 mg via INTRAVENOUS
  Filled 2020-12-20: qty 40

## 2020-12-20 MED ORDER — ATORVASTATIN CALCIUM 10 MG PO TABS
20.0000 mg | ORAL_TABLET | Freq: Every day | ORAL | Status: DC
  Administered 2020-12-20 – 2020-12-23 (×4): 20 mg via ORAL
  Filled 2020-12-20 (×4): qty 2

## 2020-12-20 MED ORDER — DEXAMETHASONE SODIUM PHOSPHATE 10 MG/ML IJ SOLN
6.0000 mg | INTRAMUSCULAR | Status: DC
  Administered 2020-12-20 – 2020-12-22 (×4): 6 mg via INTRAVENOUS
  Filled 2020-12-20 (×4): qty 1

## 2020-12-20 MED ORDER — SODIUM CHLORIDE 0.9 % IV SOLN
100.0000 mg | Freq: Every day | INTRAVENOUS | Status: DC
  Administered 2020-12-21 – 2020-12-23 (×3): 100 mg via INTRAVENOUS
  Filled 2020-12-20 (×3): qty 20

## 2020-12-20 MED ORDER — PANTOPRAZOLE SODIUM 40 MG PO TBEC
40.0000 mg | DELAYED_RELEASE_TABLET | Freq: Every day | ORAL | Status: DC
  Administered 2020-12-20 – 2020-12-23 (×4): 40 mg via ORAL
  Filled 2020-12-20 (×4): qty 1

## 2020-12-20 MED ORDER — ENOXAPARIN SODIUM 30 MG/0.3ML ~~LOC~~ SOLN
30.0000 mg | SUBCUTANEOUS | Status: DC
  Administered 2020-12-20: 30 mg via SUBCUTANEOUS
  Filled 2020-12-20: qty 0.3

## 2020-12-20 NOTE — Progress Notes (Signed)
UPDATE: Patient's COVID-19 test performed in ED today has now resulted positive, rendering the following update to the patient's assessment and plan as follows:    #) Severe COVID-19 infection: diagnosis on the basis of Presenting acute hypoxic respiratory distress with presenting oxygen saturation in the high 80s on room air, not requiring 3 L nasal cannula to maintain oxygen saturation greater than 94% in the context of no known baseline supplemental oxygen requirements, with COVID-19 testing performed in the ED this evening found to be positive. In setting of acute hypoxia, criteria are met from patient's COVID-19 infection to be considered severe in nature. Consequently, there is a Grade 2c rec for dexamethasone, which is further supported by treatment guidance recommendations from [facility]'s Covid Treatment Guidelines. Of note, in the setting of the patient's age greater than 41 years old, this patient meets criteria to be considered high risk for a more complicated clinical course of COVID-19 infection, including increased probability for progression of the severity associated with their clinical course.  In the context of the patient's baseline dementia, he is unable to convey if he is experiencing any acute respiratory symptoms.  However, in the context of aforementioned acute hypoxic respiratory distress, although specific duration of this acute hypoxia is currently unclear, will consider presentation to be consistent with symptomatic COVID-19 infection requiring hospitalization for further evaluation and management thereof, and in the context of the presence of high risk criteria, indications are met for initiation of remdesivir per treatment guidance recommendations from [facility]'s Covid Treatment Guidelines. Of note, ALT found to be less than 220. Therefore, there is no contraindication for initiation of remdesivir on the basis of transaminitis.  Of note, presentation is potentially complicated  by possible underlying COPD, as further described in my H&P.  No known COVID-19 exposures.  Chest x-ray has been ordered, with results currently pending.  Additionally, will check inflammatory markers, as further described below.    Plan: Airborne and contact precautions. Monitor continuous pulse oximetry and monitor on telemetry. prn supplemental O2 to maintain O2 sats greater than or equal to 94%. Proning protocol initiated. PRN albuterol inhaler. PRN acetaminophen for fever. Start dexamethasone and remdesivir, as above, although may consider transitioning dexamethasone to Solu-Medrol if additional to support underlying COPD as encountered. Check inflammatory markers (fibrinogen, d dimer or fibrin derivatives, crp, ferritin, LDH) in the morning. Check serum magnesium and phosphorus levels. Check CMP and CBC in the morning.  Consider checking ABG for the purpose of evaluating PaO2 to FiO2 ratio. Flutter valve and incentive spirometry. If rapid progression of supplemental oxygen demand or if development of need for high flow O2, would consider initiation of Tocilizumab at that point. Add-on procalcitonin level.  Following for results of chest x-ray, as above.      Babs Bertin, DO Hospitalist

## 2020-12-20 NOTE — Progress Notes (Signed)
STROKE TEAM PROGRESS NOTE   INTERVAL HISTORY Remains in the ED awaiting bed. Admitted from SNF. Patient presented with right facial droop and frequent falls. He does have a history of mild baseline dementia. He has no insight into his condition and cannot tell me why he is in the hospital. MRI scan shows a small left frontal corona radiator infarct. There is incidental large left temporal arachnoid cyst. MRI of the brain and neck both did not show any large vessel stenosis or occlusion. Urine drug screen is negative. LDL cholesterol is 1 1 4  mg percent and hemoglobin A1c is 5.7. Echocardiogram is pending  Vitals:   12/20/20 0400 12/20/20 0500 12/20/20 0700 12/20/20 0800  BP: (!) 144/80 (!) 136/118 138/83 (!) 155/88  Pulse: 71 81 90 74  Resp: (!) 25 (!) 24 (!) 26 (!) 25  Temp:      TempSrc:      SpO2: 93% 90% (!) 85% 96%   CBC:  Recent Labs  Lab 12/19/20 1803 12/19/20 1814 12/20/20 0317 12/20/20 0653  WBC 6.1  --  5.5  --   NEUTROABS 4.4  --  3.7  --   HGB 18.1*   < > 17.9* 16.7  HCT 55.6*   < > 52.0 49.0  MCV 101.5*  --  99.2  --   PLT 136*  --  119*  --    < > = values in this interval not displayed.   Basic Metabolic Panel:  Recent Labs  Lab 12/19/20 1803 12/19/20 1814 12/20/20 0317 12/20/20 0653  NA 140 143 142 145  K 3.5 3.5 3.7 4.2  CL 106 106 108  --   CO2 24  --  22  --   GLUCOSE 121* 119* 116*  --   BUN 17 20 20   --   CREATININE 1.09 1.00 0.91  --   CALCIUM 8.9  --  8.7*  --   MG  --   --  2.2  --   PHOS  --   --  3.5  --    Lipid Panel:  Recent Labs  Lab 12/20/20 0317  CHOL 170  TRIG 75  HDL 41  CHOLHDL 4.1  VLDL 15  LDLCALC 114*   HgbA1c:  Recent Labs  Lab 12/20/20 0317  HGBA1C 5.7*   Urine Drug Screen:  Recent Labs  Lab 12/20/20 0645  LABOPIA NONE DETECTED  COCAINSCRNUR NONE DETECTED  LABBENZ NONE DETECTED  AMPHETMU NONE DETECTED  THCU NONE DETECTED  LABBARB NONE DETECTED    Alcohol Level No results for input(s): ETH in the last  168 hours.  IMAGING past 24 hours CT HEAD WO CONTRAST  Result Date: 12/19/2020 CLINICAL DATA:  Neuro deficit, right facial droop and dysphagia. Status post fall EXAM: CT HEAD WITHOUT CONTRAST TECHNIQUE: Contiguous axial images were obtained from the base of the skull through the vertex without intravenous contrast. COMPARISON:  None. FINDINGS: Brain: Cerebral ventricle sizes are concordant with the degree of cerebral volume loss. Patchy and confluent areas of decreased attenuation are noted throughout the deep and periventricular white matter of the cerebral hemispheres bilaterally, compatible with chronic microvascular ischemic disease. No evidence of large-territorial acute infarction. No parenchymal hemorrhage. No mass lesion. No extra-axial collection. Large 9 cm left anterior temporal cystic lesion similar to cerebrospinal fluid. Associated mass effect on the surrounding cerebral cortex. Another cystic lesion is noted along the frontal horn of the left lateral ventricle (5:42). No midline shift. No hydrocephalus. Basilar cisterns are patent.  Vascular: No hyperdense vessel. Atherosclerotic calcifications are present within the cavernous internal carotid arteries. Skull: No acute fracture or focal lesion. Sinuses/Orbits: Paranasal sinuses and mastoid air cells are clear. The orbits are unremarkable. Other: None. IMPRESSION: 1. No acute intracranial abnormality. 2. Large 9 cm left anterior temporal cystic lesion likely representing an arachnoid cyst. Associated chronic mass effect on the surrounding frontal and temporal lobe. Differential diagnosis includes epidermoid cyst. Comparison with prior cross-sectional imaging would be of value to evaluate stability. Electronically Signed   By: Tish Frederickson M.D.   On: 12/19/2020 18:48   MR ANGIO HEAD WO CONTRAST  Result Date: 12/20/2020 CLINICAL DATA:  Initial evaluation for acute TIA, right-sided facial droop, falls. EXAM: MRI HEAD WITHOUT CONTRAST MRA HEAD  WITHOUT CONTRAST MRA NECK WITHOUT CONTRAST TECHNIQUE: Multiplanar, multiecho pulse sequences of the brain and surrounding structures were obtained without intravenous contrast. Angiographic images of the Circle of Willis were obtained using MRA technique without intravenous contrast. Angiographic images of the neck were obtained using MRA technique without intravenous contrast. Carotid stenosis measurements (when applicable) are obtained utilizing NASCET criteria, using the distal internal carotid diameter as the denominator. COMPARISON:  Prior CT from 12/19/2020. FINDINGS: MRI HEAD FINDINGS Brain: Diffuse prominence of the CSF containing spaces compatible with generalized age-related cerebral atrophy. Patchy T2/FLAIR hyperintensity within the periventricular and deep white matter both cerebral hemispheres consistent with chronic small vessel ischemic disease, mild in nature. Patchy involvement of the pons noted. Superimposed remote hemorrhagic lacunar infarct present at the left basal ganglia. Few additional small remote left basal ganglia and left thalamic lacunar infarcts noted. 9 mm focus of mild diffusion signal abnormality seen involving the subcortical and deep white matter of the posterior left frontal centrum semi ovale (series 5, image 94). No discernible associated ADC correlate. Finding likely reflects a small focus of subacute small vessel ischemia. No associated hemorrhage or mass effect. No other diffusion abnormality to suggest acute or subacute ischemia. Gray-white matter differentiation maintained. No other areas of remote cortical infarction. No other foci of susceptibility artifact to suggest acute or chronic intracranial hemorrhage. Well-circumscribed cystic lesion measuring 7.3 x 5.0 x 6.9 cm seen at the left frontotemporal region. This follows CSF signal intensity on all pulse sequences, consistent with a benign arachnoid cyst. Associated mild regional mass effect with trace 2-3 mm of  left-to-right shift. No hydrocephalus or ventricular trapping. No other mass lesion or mass effect. No extra-axial fluid collection. Pituitary gland and suprasellar region normal. Midline structures intact. Vascular: Major intracranial vascular flow voids are maintained. Skull and upper cervical spine: Craniocervical junction within normal limits. Bone marrow signal intensity normal. No scalp soft tissue abnormality. Sinuses/Orbits: Globes and orbital soft tissues within normal limits. Scattered mucosal thickening noted within the ethmoidal air cells and maxillary sinuses. Paranasal sinuses are otherwise clear. Trace fluid signal intensity noted within the mastoid air cells bilaterally, of doubtful significance. Inner ear structures grossly within normal limits. Other: None. MRA HEAD FINDINGS ANTERIOR CIRCULATION: Visualized distal cervical segments of the internal carotid arteries are widely patent with antegrade flow. Petrous, cavernous, and supraclinoid segments widely patent without stenosis or other abnormality. A1 segments patent bilaterally. Normal anterior communicating artery complex. Anterior cerebral arteries widely patent to their distal aspects. No M1 stenosis or occlusion. Normal MCA bifurcations. Distal MCA branches well perfused bilaterally. Mass effect with superior displacement of the left MCA by the large left-sided arachnoid cyst noted. POSTERIOR CIRCULATION: Both V4 segments widely patent to the vertebrobasilar junction. Both PICA origins patent  and normal. Basilar widely patent to its distal aspect. Superior cerebellar arteries patent bilaterally. Both PCAs supplied via the basilar well perfused to their distal aspects. No intracranial aneurysm. MRA NECK FINDINGS AORTIC ARCH: Examination technically limited by lack of IV contrast. Visualized aortic arch of normal caliber with normal 3 vessel morphology. No hemodynamically significant stenosis seen about the origin of the great vessels. RIGHT  CAROTID SYSTEM: Right CCA patent from its origin to the bifurcation without stenosis. Mild for age atheromatous irregularity about the right bifurcation/proximal right ICA without hemodynamically significant stenosis. Right ICA widely patent distally without stenosis, evidence for dissection, or occlusion. LEFT CAROTID SYSTEM: Left CCA patent from its origin to the bifurcation without stenosis. Mild for age atheromatous irregularity about the left bifurcation/proximal left ICA without significant stenosis. Left ICA patent distally without stenosis, evidence for dissection, or occlusion. VERTEBRAL ARTERIES: Both vertebral arteries appear to arise from the subclavian arteries. No visible proximal subclavian artery stenosis. Proximal left V1 segment not well seen due to tortuosity and lack of IV contrast. Visualized portions of the vertebral arteries are patent within the neck without stenosis, evidence for dissection or occlusion. IMPRESSION: MRI HEAD IMPRESSION: 1. 9 mm focus of mild diffusion abnormality involving the subcortical/deep white matter of the posterior left frontal centrum semi ovale, most likely reflecting a small focus of subacute small vessel ischemia. No associated hemorrhage or mass effect. 2. No other acute intracranial abnormality. 3. 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 4. 7.3 x 5.0 x 6.9 cm benign arachnoid cyst at the left frontotemporal region with associated regional mass effect and trace 2-3 mm of left-to-right shift. MRA HEAD IMPRESSION: Negative intracranial MRA. No large vessel occlusion. No hemodynamically significant or correctable stenosis. MRA NECK IMPRESSION: Negative MRA of the neck. No hemodynamically significant or critical flow limiting stenosis within the neck. Electronically Signed   By: Rise MuBenjamin  McClintock M.D.   On: 12/20/2020 03:35   MR  ANGIO NECK WO CONTRAST  Result Date: 12/20/2020 CLINICAL DATA:  Initial evaluation for acute TIA, right-sided facial droop, falls. EXAM: MRI HEAD WITHOUT CONTRAST MRA HEAD WITHOUT CONTRAST MRA NECK WITHOUT CONTRAST TECHNIQUE: Multiplanar, multiecho pulse sequences of the brain and surrounding structures were obtained without intravenous contrast. Angiographic images of the Circle of Willis were obtained using MRA technique without intravenous contrast. Angiographic images of the neck were obtained using MRA technique without intravenous contrast. Carotid stenosis measurements (when applicable) are obtained utilizing NASCET criteria, using the distal internal carotid diameter as the denominator. COMPARISON:  Prior CT from 12/19/2020. FINDINGS: MRI HEAD FINDINGS Brain: Diffuse prominence of the CSF containing spaces compatible with generalized age-related cerebral atrophy. Patchy T2/FLAIR hyperintensity within the periventricular and deep white matter both cerebral hemispheres consistent with chronic small vessel ischemic disease, mild in nature. Patchy involvement of the pons noted. Superimposed remote hemorrhagic lacunar infarct present at the left basal ganglia. Few additional small remote left basal ganglia and left thalamic lacunar infarcts noted. 9 mm focus of mild diffusion signal abnormality seen involving the subcortical and deep white matter of the posterior left frontal centrum semi ovale (series 5, image 94). No discernible associated ADC correlate. Finding likely reflects a small focus of subacute small vessel ischemia. No associated hemorrhage or mass effect. No other diffusion abnormality to suggest acute or subacute ischemia. Gray-white matter differentiation maintained. No other areas of remote cortical infarction. No other foci of susceptibility artifact to  suggest acute or chronic intracranial hemorrhage. Well-circumscribed cystic lesion measuring 7.3 x 5.0 x 6.9 cm seen at the left frontotemporal  region. This follows CSF signal intensity on all pulse sequences, consistent with a benign arachnoid cyst. Associated mild regional mass effect with trace 2-3 mm of left-to-right shift. No hydrocephalus or ventricular trapping. No other mass lesion or mass effect. No extra-axial fluid collection. Pituitary gland and suprasellar region normal. Midline structures intact. Vascular: Major intracranial vascular flow voids are maintained. Skull and upper cervical spine: Craniocervical junction within normal limits. Bone marrow signal intensity normal. No scalp soft tissue abnormality. Sinuses/Orbits: Globes and orbital soft tissues within normal limits. Scattered mucosal thickening noted within the ethmoidal air cells and maxillary sinuses. Paranasal sinuses are otherwise clear. Trace fluid signal intensity noted within the mastoid air cells bilaterally, of doubtful significance. Inner ear structures grossly within normal limits. Other: None. MRA HEAD FINDINGS ANTERIOR CIRCULATION: Visualized distal cervical segments of the internal carotid arteries are widely patent with antegrade flow. Petrous, cavernous, and supraclinoid segments widely patent without stenosis or other abnormality. A1 segments patent bilaterally. Normal anterior communicating artery complex. Anterior cerebral arteries widely patent to their distal aspects. No M1 stenosis or occlusion. Normal MCA bifurcations. Distal MCA branches well perfused bilaterally. Mass effect with superior displacement of the left MCA by the large left-sided arachnoid cyst noted. POSTERIOR CIRCULATION: Both V4 segments widely patent to the vertebrobasilar junction. Both PICA origins patent and normal. Basilar widely patent to its distal aspect. Superior cerebellar arteries patent bilaterally. Both PCAs supplied via the basilar well perfused to their distal aspects. No intracranial aneurysm. MRA NECK FINDINGS AORTIC ARCH: Examination technically limited by lack of IV contrast.  Visualized aortic arch of normal caliber with normal 3 vessel morphology. No hemodynamically significant stenosis seen about the origin of the great vessels. RIGHT CAROTID SYSTEM: Right CCA patent from its origin to the bifurcation without stenosis. Mild for age atheromatous irregularity about the right bifurcation/proximal right ICA without hemodynamically significant stenosis. Right ICA widely patent distally without stenosis, evidence for dissection, or occlusion. LEFT CAROTID SYSTEM: Left CCA patent from its origin to the bifurcation without stenosis. Mild for age atheromatous irregularity about the left bifurcation/proximal left ICA without significant stenosis. Left ICA patent distally without stenosis, evidence for dissection, or occlusion. VERTEBRAL ARTERIES: Both vertebral arteries appear to arise from the subclavian arteries. No visible proximal subclavian artery stenosis. Proximal left V1 segment not well seen due to tortuosity and lack of IV contrast. Visualized portions of the vertebral arteries are patent within the neck without stenosis, evidence for dissection or occlusion. IMPRESSION: MRI HEAD IMPRESSION: 1. 9 mm focus of mild diffusion abnormality involving the subcortical/deep white matter of the posterior left frontal centrum semi ovale, most likely reflecting a small focus of subacute small vessel ischemia. No associated hemorrhage or mass effect. 2. No other acute intracranial abnormality. 3. 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 4. 7.3 x 5.0 x 6.9 cm benign arachnoid cyst at the left frontotemporal region with associated regional mass effect and trace 2-3 mm of left-to-right shift. MRA HEAD IMPRESSION: Negative intracranial MRA. No large vessel occlusion. No hemodynamically significant or correctable stenosis. MRA NECK IMPRESSION: Negative MRA of the neck. No  hemodynamically significant or critical flow limiting stenosis within the neck. Electronically Signed   By: Jeannine Boga M.D.   On: 12/20/2020 03:35   MR BRAIN WO CONTRAST  Result Date: 12/20/2020 CLINICAL DATA:  Initial evaluation for acute TIA, right-sided facial droop, falls. EXAM: MRI HEAD WITHOUT CONTRAST MRA HEAD WITHOUT CONTRAST MRA NECK WITHOUT CONTRAST TECHNIQUE: Multiplanar, multiecho pulse sequences of the brain and surrounding structures were obtained without intravenous contrast. Angiographic images of the Circle of Willis were obtained using MRA technique without intravenous contrast. Angiographic images of the neck were obtained using MRA technique without intravenous contrast. Carotid stenosis measurements (when applicable) are obtained utilizing NASCET criteria, using the distal internal carotid diameter as the denominator. COMPARISON:  Prior CT from 12/19/2020. FINDINGS: MRI HEAD FINDINGS Brain: Diffuse prominence of the CSF containing spaces compatible with generalized age-related cerebral atrophy. Patchy T2/FLAIR hyperintensity within the periventricular and deep white matter both cerebral hemispheres consistent with chronic small vessel ischemic disease, mild in nature. Patchy involvement of the pons noted. Superimposed remote hemorrhagic lacunar infarct present at the left basal ganglia. Few additional small remote left basal ganglia and left thalamic lacunar infarcts noted. 9 mm focus of mild diffusion signal abnormality seen involving the subcortical and deep white matter of the posterior left frontal centrum semi ovale (series 5, image 94). No discernible associated ADC correlate. Finding likely reflects a small focus of subacute small vessel ischemia. No associated hemorrhage or mass effect. No other diffusion abnormality to suggest acute or subacute ischemia. Gray-white matter differentiation maintained. No other areas of remote cortical infarction. No other foci of  susceptibility artifact to suggest acute or chronic intracranial hemorrhage. Well-circumscribed cystic lesion measuring 7.3 x 5.0 x 6.9 cm seen at the left frontotemporal region. This follows CSF signal intensity on all pulse sequences, consistent with a benign arachnoid cyst. Associated mild regional mass effect with trace 2-3 mm of left-to-right shift. No hydrocephalus or ventricular trapping. No other mass lesion or mass effect. No extra-axial fluid collection. Pituitary gland and suprasellar region normal. Midline structures intact. Vascular: Major intracranial vascular flow voids are maintained. Skull and upper cervical spine: Craniocervical junction within normal limits. Bone marrow signal intensity normal. No scalp soft tissue abnormality. Sinuses/Orbits: Globes and orbital soft tissues within normal limits. Scattered mucosal thickening noted within the ethmoidal air cells and maxillary sinuses. Paranasal sinuses are otherwise clear. Trace fluid signal intensity noted within the mastoid air cells bilaterally, of doubtful significance. Inner ear structures grossly within normal limits. Other: None. MRA HEAD FINDINGS ANTERIOR CIRCULATION: Visualized distal cervical segments of the internal carotid arteries are widely patent with antegrade flow. Petrous, cavernous, and supraclinoid segments widely patent without stenosis or other abnormality. A1 segments patent bilaterally. Normal anterior communicating artery complex. Anterior cerebral arteries widely patent to their distal aspects. No M1 stenosis or occlusion. Normal MCA bifurcations. Distal MCA branches well perfused bilaterally. Mass effect with superior displacement of the left MCA by the large left-sided arachnoid cyst noted. POSTERIOR CIRCULATION: Both V4 segments widely patent to the vertebrobasilar junction. Both PICA origins patent and normal. Basilar widely patent to its distal aspect. Superior cerebellar arteries patent bilaterally. Both PCAs  supplied via the basilar well perfused to their distal aspects. No intracranial aneurysm. MRA NECK FINDINGS AORTIC ARCH: Examination technically limited by lack of IV contrast. Visualized aortic arch of normal caliber with normal 3 vessel morphology. No hemodynamically significant stenosis seen about the origin of the great vessels. RIGHT CAROTID SYSTEM: Right CCA patent from its origin to the bifurcation without stenosis. Mild for age atheromatous irregularity about the right bifurcation/proximal right ICA without hemodynamically significant stenosis. Right ICA widely patent distally without stenosis, evidence for dissection, or occlusion.  LEFT CAROTID SYSTEM: Left CCA patent from its origin to the bifurcation without stenosis. Mild for age atheromatous irregularity about the left bifurcation/proximal left ICA without significant stenosis. Left ICA patent distally without stenosis, evidence for dissection, or occlusion. VERTEBRAL ARTERIES: Both vertebral arteries appear to arise from the subclavian arteries. No visible proximal subclavian artery stenosis. Proximal left V1 segment not well seen due to tortuosity and lack of IV contrast. Visualized portions of the vertebral arteries are patent within the neck without stenosis, evidence for dissection or occlusion. IMPRESSION: MRI HEAD IMPRESSION: 1. 9 mm focus of mild diffusion abnormality involving the subcortical/deep white matter of the posterior left frontal centrum semi ovale, most likely reflecting a small focus of subacute small vessel ischemia. No associated hemorrhage or mass effect. 2. No other acute intracranial abnormality. 3. 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 4. 7.3 x 5.0 x 6.9 cm benign arachnoid cyst at the left frontotemporal region with associated regional mass effect and trace 2-3 mm of left-to-right shift. MRA HEAD  IMPRESSION: Negative intracranial MRA. No large vessel occlusion. No hemodynamically significant or correctable stenosis. MRA NECK IMPRESSION: Negative MRA of the neck. No hemodynamically significant or critical flow limiting stenosis within the neck. Electronically Signed   By: Jeannine Boga M.D.   On: 12/20/2020 03:35   DG Chest Portable 1 View  Result Date: 12/19/2020 CLINICAL DATA:  Initial evaluation for acute dysphagia. EXAM: PORTABLE CHEST 1 VIEW COMPARISON:  Prior radiograph from 11/02/2016. FINDINGS: Exaggeration of the cardiac silhouette related to AP technique and shallow lung inflation. Mediastinal silhouette normal. Tortuosity the intrathoracic aorta noted. Slight left-to-right deviation of the tracheal air column at the level of the aortic knob, similar to previous. No appreciable esophageal dilatation. Lungs are hypoinflated. Streaky and linear opacities at the right greater than left lung bases felt to be most consistent with atelectasis. No other focal infiltrates. No pulmonary edema or visible pleural effusion. Left costophrenic angle incompletely visualized. No pneumothorax. No acute osseous finding.  Osteopenia noted. IMPRESSION: 1. Shallow lung inflation with associated bibasilar atelectasis, right greater than left. 2. No other active cardiopulmonary disease. Electronically Signed   By: Jeannine Boga M.D.   On: 12/19/2020 19:39    PHYSICAL EXAM Pleasant elderly Caucasian male not in distress. He is hard of hearing. . Afebrile. Head is nontraumatic. Neck is supple without bruit.    Cardiac exam no murmur or gallop. Lungs are clear to auscultation. Distal pulses are well felt. Neurological Exam : He is awake alert is oriented to person only. Diminished attention, registration and recall. Poor insight into his condition. Diminished throughout recall. Follows simple midline and few one-step commands only. Speech is clear without dysarthria or aphasia. Extraocular movements  are full range without nystagmus. Blinks to threat bilaterally. Mild right lower facial weakness. Tongue midline. Motor system exam shows symmetric upper extremity strength without any drift or focal weakness. Mild right lower extremity weakness on double simultaneous testing at hip flexors and ankle dorsiflexors only. Sensation is intact bilaterally. Gait not tested. ASSESSMENT/PLAN Mr. Wayne Walls is a 87 y.o. male with history of COPD,collagenous colitis,prostate cancer, dementia presenting with right-sided facial droop and frequent falls of increasing frequency.    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 L anterior temporal arachnoid cyst w/ mass effect.  MRI  Posterior L centrum semiovale  small infarct. Small vessel disease. Atrophy. 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 L frontotemporal mass effect w/ trace L to R midline shift   MRA head and neck  Unremarkable   2D Echo pending   LDL 114  HgbA1c 5.7  VTE prophylaxis - SCDs    aspirin 81 mg daily prior to admission, now on aspirin 81 mg daily and clopidogrel 75 mg daily. Continue DAPT x 3 weeks then plavix alone.   Therapy recommendations:  pending   Disposition:  pending   COVID-19 Infection  Acute Hypoxic Respiratory failure d/t COVID-19 vs underlying COPD  Airborne and contact precautions  O2  Steroids and remdesivir   Hyperlipidemia  Home meds:  No statin  LDL 114, goal < 70  Added lipitor 20 d/t age  Continue statin at discharge  Other Stroke Risk Factors  Advanced Age >/= 65   Hx ETOH use  Chronic Obstructive sleep apnea, on CPAP at home  Other Active Problems  Baseline dementia  Prostate cancer  Frequent falls    Hospital day # 1 Patient presented with mild speech difficulties and right-sided weakness due to left subcortical infarct from small vessel disease. Recommend aspirin Plavix for 3 weeks followed  by Plavix alone and aggressive risk factor modification. Check echocardiogram results. Continue treatment for COVID-19 infection as per medical team. Discussed with Dr. Emeline Gins. Stroke team will sign off. Kindly call for questions.  To contact Stroke Continuity provider, please refer to [email or web address] After hours, contact General Neurology

## 2020-12-20 NOTE — Evaluation (Addendum)
Occupational Therapy Evaluation Patient Details Name: Wayne Walls MRN: [id number] DOB: [birth year] Today's Date: 12/20/2020    History of Present Illness 87 y.o. male with medical history significant for dementia, COPD, chronic polycythemia, glaucoma who is admitted to [facility] on 12/19/2020 with suspected acute ischemic stroke after presenting from home via EMS to [facility] ED for further evaluation frequent falls. Tested COVID+.   Clinical Impression   PTA, pt was living with his wife and reports he was performing BADLs. Pt currently requiring Min A for ADLs and Min A +2 for functional mobility. Pt presenting with decreased balance, strength, and activity tolerance. Encouraging pt to participating in eating breakfast; pt eating banana and drinking orange juice with therapist present. Pt would benefit from further acute OT to facilitate safe dc. Recommend dc to home with HHOT for further OT to optimize safety, independence with ADLs, and return to PLOF.     Follow Up Recommendations  Home health OT;Supervision/Assistance - 24 hour (Will need 24/7 support; would benefit from home environment)    Equipment Recommendations  3 in 1 bedside commode    Recommendations for Other Services PT consult     Precautions / Restrictions Precautions Precautions: Fall      Mobility Bed Mobility Overal bed mobility: Needs Assistance Bed Mobility: Supine to Sit;Sit to Supine     Supine to sit: Mod assist;+2 for physical assistance Sit to supine: Min assist   General bed mobility comments: Mod A to trunk for sup to sit, min A to LE's for return to supine    Transfers Overall transfer level: Needs assistance Equipment used: 2 person hand held assist Transfers: Sit to/from Stand Sit to Stand: Min assist;+2 physical assistance         General transfer comment: needed min A +2 to gain balance, stood with narrow base, especially needed support on R side.    Balance Overall balance  assessment: Needs assistance Sitting-balance support: No upper extremity supported;Feet supported Sitting balance-Leahy Scale: Good     Standing balance support: During functional activity;Bilateral upper extremity supported Standing balance-Leahy Scale: Poor Standing balance comment: Reliant on UE support                           ADL either performed or assessed with clinical judgement   ADL Overall ADL's : Needs assistance/impaired Eating/Feeding: Set up;Sitting Eating/Feeding Details (indicate cue type and reason): Encouraging pt to eat breakfast; Pt eating a banana Grooming: Set up;Supervision/safety;Sitting   Upper Body Bathing: Supervision/ safety;Min guard;Sitting   Lower Body Bathing: Minimal assistance;Sit to/from stand   Upper Body Dressing : Supervision/safety;Set up;Sitting   Lower Body Dressing: Minimal assistance;Sit to/from stand   Toilet Transfer: Minimal assistance;Ambulation;+2 for safety/equipment;+2 for physical assistance (simulated at EOB)           Functional mobility during ADLs: Minimal assistance;+2 for physical assistance;+2 for safety/equipment General ADL Comments: Pt presenting with decreased balance, strength, cognition, and actiivty tolerance.     Vision Baseline Vision/History: No visual deficits       Perception     Praxis      Pertinent Vitals/Pain Pain Assessment: No/denies pain     Hand Dominance Right   Extremity/Trunk Assessment Upper Extremity Assessment Upper Extremity Assessment: Overall WFL for tasks assessed   Lower Extremity Assessment Lower Extremity Assessment: Defer to PT evaluation   Cervical / Trunk Assessment Cervical / Trunk Assessment: Normal   Communication Communication Communication: No difficulties  Cognition Arousal/Alertness: Awake/alert Behavior During Therapy: WFL for tasks assessed/performed Overall Cognitive Status: Impaired/Different from baseline Area of Impairment:  Orientation;Memory;Safety/judgement;Problem solving                 Orientation Level: Disoriented to;Place;Time;Situation   Memory: Decreased short-term memory   Safety/Judgement: Decreased awareness of safety;Decreased awareness of deficits   Problem Solving: Slow processing;Requires verbal cues;Requires tactile cues General Comments: pt with dementia, unsure of baseline. Was much more oriented once fully awake and sitting EOB but still did not know where he was or why   General Comments  Upon arrival, pt on 4L O and SpO2 was 93%. SpO2 dropping to 84% on RA. Placing back on 4L.    Exercises     Shoulder Instructions      Home Living Family/patient expects to be discharged to:: Private residence Living Arrangements: Spouse/significant other Available Help at Discharge: Family;Personal care attendant Type of Home: [place]: One level                   Additional Comments: Wifes name is Sunday Spillers      Prior Functioning/Environment Level of Independence: Independent        Comments: "I just walk in my house". furnature surf        OT Problem List: Decreased activity tolerance;Decreased strength;Decreased range of motion;Impaired balance (sitting and/or standing);Decreased cognition;Decreased safety awareness;Decreased knowledge of use of DME or AE;Decreased knowledge of precautions;Pain      OT Treatment/Interventions: Self-care/ADL training;Therapeutic exercise;Energy conservation;DME and/or AE instruction;Therapeutic activities;Patient/family education    OT Goals(Current goals can be found in the care plan section) Acute Rehab OT Goals Patient Stated Goal: Unstated OT Goal Formulation: With patient Time For Goal Achievement: 01/03/21 Potential to Achieve Goals: Good  OT Frequency: Min 3X/week   Barriers to D/C:            Co-evaluation              AM-PAC OT "6 Clicks" Daily Activity     Outcome Measure Help from another  person eating meals?: A Little Help from another person taking care of personal grooming?: A Little Help from another person toileting, which includes using toliet, bedpan, or urinal?: A Little Help from another person bathing (including washing, rinsing, drying)?: A Little Help from another person to put on and taking off regular upper body clothing?: A Little Help from another person to put on and taking off regular lower body clothing?: A Little 6 Click Score: 18   End of Session Equipment Utilized During Treatment: Oxygen (4L) Nurse Communication: Mobility status;Other (comment) (Needs a new IV; bleeding)  Activity Tolerance: Patient limited by fatigue Patient left: in bed;with call bell/phone within reach  OT Visit Diagnosis: Unsteadiness on feet (R26.81);Other abnormalities of gait and mobility (R26.89);Pain;Muscle weakness (generalized) (M62.81) Pain - Right/Left: Left Pain - part of body:  (IV site)                Time: 9937-1696 OT Time Calculation (min): 39 min Charges:  OT General Charges $OT Visit: 1 Visit OT Evaluation $OT Eval Moderate Complexity: 1 Mod OT Treatments $Self Care/Home Management : 8-22 mins  Lylliana Kitamura MSOT, OTR/L Acute Rehab Pager: [phone] Office: [place] 12/20/2020, 12:35 PM

## 2020-12-20 NOTE — Evaluation (Signed)
Physical Therapy Evaluation Patient Details Name: Wayne Walls MRN: [id number] DOB: [birth year] Today's Date: 12/20/2020   History of Present Illness  87 y.o. male with medical history significant for dementia, COPD, chronic polycythemia, glaucoma who is admitted to [facility] on 12/19/2020 with suspected acute ischemic stroke after presenting from home via EMS to [facility] ED for further evaluation frequent falls. Tested COVID+.  Clinical Impression  Pt admitted with above diagnosis. Pt reports living with his wife and having caregivers that come into home. Pt requiring min A for bed mobility and transfers, mod A to step in standing due to difficulty stepping RLE. Pt would benefit from further acute PT to facilitate improved mobility. Would like have him return home with HHPT if possible because home environment would be more beneficial for him from a cognitive standpoint.  Pt currently with functional limitations due to the deficits listed below (see PT Problem List). Pt will benefit from skilled PT to increase their independence and safety with mobility to allow discharge to the venue listed below.       Follow Up Recommendations Home health PT;Supervision/Assistance - 24 hour    Equipment Recommendations  Rolling walker with 5" wheels    Recommendations for Other Services       Precautions / Restrictions Precautions Precautions: Fall Restrictions Weight Bearing Restrictions: No      Mobility  Bed Mobility Overal bed mobility: Needs Assistance Bed Mobility: Supine to Sit;Sit to Supine     Supine to sit: Mod assist;+2 for physical assistance Sit to supine: Min assist   General bed mobility comments: Mod A to trunk for sup to sit, min A to LE's for return to supine    Transfers Overall transfer level: Needs assistance Equipment used: 2 person hand held assist Transfers: Sit to/from Stand Sit to Stand: Min assist;+2 physical assistance         General transfer  comment: needed min A +2 to gain balance, stood with narrow base, especially needed support on R side.  Ambulation/Gait Ambulation/Gait assistance: Mod assist;+2 physical assistance Gait Distance (Feet): 2 Feet Assistive device: 2 person hand held assist Gait Pattern/deviations: Step-to pattern Gait velocity: decreased Gait velocity interpretation: <1.31 ft/sec, indicative of household ambulator General Gait Details: stepped to [facility], had great difficulty stepping R foot away from L, maintained narrow BOS. No noted buckling R knee  Stairs            Wheelchair Mobility    Modified Rankin (Stroke Patients Only) Modified Rankin (Stroke Patients Only) Pre-Morbid Rankin Score: Slight disability Modified Rankin: Moderately severe disability     Balance Overall balance assessment: Needs assistance Sitting-balance support: No upper extremity supported;Feet supported Sitting balance-Leahy Scale: Good     Standing balance support: During functional activity;Bilateral upper extremity supported Standing balance-Leahy Scale: Poor Standing balance comment: Reliant on UE support                             Pertinent Vitals/Pain Pain Assessment: No/denies pain    Home Living Family/patient expects to be discharged to:: Private residence Living Arrangements: Spouse/significant other Available Help at Discharge: Family;Personal care attendant Type of Home: [place]: One level   Additional Comments: Wifes name is Sunday Spillers. Pt with dementia so reliability of above info is questionable. Pt could not say how much time caregiver is with them    Prior Function Level of Independence: Needs assistance  Gait / Transfers Assistance Needed: pt reports he was independent and did not use AD, just grabbed furniture when needed  ADL's / Homemaking Assistance Needed: needs assist for home mgmt, meds, etc, due to dementia  Comments: "I just walk in my house".  furnature surf     Hand Dominance   Dominant Hand: Right    Extremity/Trunk Assessment   Upper Extremity Assessment Upper Extremity Assessment: Defer to OT evaluation    Lower Extremity Assessment Lower Extremity Assessment: Generalized weakness;Difficult to assess due to impaired cognition (R>L)    Cervical / Trunk Assessment Cervical / Trunk Assessment: Kyphotic  Communication   Communication: No difficulties  Cognition Arousal/Alertness: Awake/alert Behavior During Therapy: WFL for tasks assessed/performed Overall Cognitive Status: Impaired/Different from baseline Area of Impairment: Orientation;Memory;Safety/judgement;Problem solving                 Orientation Level: Disoriented to;Place;Time;Situation   Memory: Decreased short-term memory   Safety/Judgement: Decreased awareness of safety;Decreased awareness of deficits   Problem Solving: Slow processing;Requires verbal cues;Requires tactile cues General Comments: pt with dementia, unsure of baseline. Was much more oriented once fully awake and sitting EOB but still did not know where he was or why      General Comments General comments (skin integrity, edema, etc.): SpO2 93% on 4L O2, dropped to 84% on RA, placed back on 4L and SpO2 returned to low 90's.    Exercises     Assessment/Plan    PT Assessment Patient needs continued PT services  PT Problem List Decreased strength;Decreased range of motion;Decreased activity tolerance;Decreased balance;Decreased mobility;Decreased coordination;Decreased cognition;Decreased knowledge of use of DME;Decreased safety awareness;Decreased knowledge of precautions;Cardiopulmonary status limiting activity       PT Treatment Interventions DME instruction;Gait training;Functional mobility training;Therapeutic activities;Therapeutic exercise;Balance training;Patient/family education;Cognitive remediation    PT Goals (Current goals can be found in the Care Plan section)   Acute Rehab PT Goals Patient Stated Goal: Unstated PT Goal Formulation: With patient Time For Goal Achievement: 01/03/21 Potential to Achieve Goals: Good    Frequency Min 4X/week   Barriers to discharge        Co-evaluation PT/OT/SLP Co-Evaluation/Treatment: Yes Reason for Co-Treatment: Complexity of the patient's impairments (multi-system involvement);Necessary to address cognition/behavior during functional activity;For patient/therapist safety PT goals addressed during session: Mobility/safety with mobility;Balance         AM-PAC PT "6 Clicks" Mobility  Outcome Measure Help needed turning from your back to your side while in a flat bed without using bedrails?: A Little Help needed moving from lying on your back to sitting on the side of a flat bed without using bedrails?: A Lot Help needed moving to and from a bed to a chair (including a wheelchair)?: A Lot Help needed standing up from a chair using your arms (e.g., wheelchair or bedside chair)?: A Lot Help needed to walk in hospital room?: A Lot Help needed climbing 3-5 steps with a railing? : Total 6 Click Score: 12    End of Session   Activity Tolerance: Patient tolerated treatment well Patient left: in bed;with call bell/phone within reach Nurse Communication: Mobility status PT Visit Diagnosis: Unsteadiness on feet (R26.81);Other abnormalities of gait and mobility (R26.89);Difficulty in walking, not elsewhere classified (R26.2)    Time: [id number] PT Time Calculation (min) (ACUTE ONLY): 34 min   Charges:   PT Evaluation $PT Eval Moderate Complexity: 1 Mod          Leighton Roach, [place]  Pager [phone] Office [phone]  [place] 12/20/2020, 1:38 PM

## 2020-12-20 NOTE — Evaluation (Signed)
Clinical/Bedside Swallow Evaluation Patient Details  Name: Wayne Walls MRN: [id number] Date of Birth: [birth year]  Today's Date: 12/20/2020 Time: SLP Start Time (ACUTE ONLY): 55 SLP Stop Time (ACUTE ONLY): 1240 SLP Time Calculation (min) (ACUTE ONLY): 10 min  Past Medical History:  Past Medical History:  Diagnosis Date  . COPD (chronic obstructive pulmonary disease) ([place])   . Dementia ([place])   . Depression   . Osteoarthrosis, unspecified whether generalized or localized, unspecified site   . Prostate cancer ([place])   . Syncope    Carotids, cardiology  . Unspecified glaucoma(365.9)   . Unspecified sleep apnea    Chronic   Past Surgical History:  Past Surgical History:  Procedure Laterality Date  . HERNIA REPAIR     HPI:  Wayne Walls is a 87 y.o. male with medical history significant for dementia, COPD, chronic polycythemia, glaucoma who is admitted to [facility] on 12/19/2020 with suspected acute ischemic stroke after presenting from home via EMS to [facility] Emergency Department for further evaluation frequent falls. Presented noncontrast CT of the head showed no evidence of acute intracranial process, including no evidence of acute ischemic infarct or acute intracranial hemorrhage, but showed evidence of a left anterior temporal cystic lesion associated with evidence of chronic mass-effect, as further described below.   Additionally, over the last 3 to 4 days, the patient's wife notes that the patient has been coughing to the point where he appears to intermittently be choking when attempting to eat food over that timeframe.  She conveys that this is a new development.  She also reports that the patient has been exhibiting progressive generalized weakness over the last several months, and is now requiring increasing assistance with performance of all of his ADLs. Pt also found to have COVID 19.   Assessment / Plan / Recommendation Clinical Impression  Pt demosntrates no signs of  dysphagia or aspiration. No coughing with PO intake. RN also confirms that pt has tolerated PO well during her shift. Will f/u x1 given that wife had observed dysphagia at home. For now pt may continue regular diet and thin liquids. SLP Visit Diagnosis: Dysphagia, oropharyngeal phase (R13.12)    Aspiration Risk  Mild aspiration risk    Diet Recommendation Regular;Thin liquid   Liquid Administration via: Cup;Straw Medication Administration: Whole meds with liquid Supervision: Patient able to self feed Postural Changes: Seated upright at 90 degrees    Other  Recommendations Oral Care Recommendations: Oral care BID   Follow up Recommendations Skilled Nursing facility      Frequency and Duration min 2x/week  1 week       Prognosis Prognosis for Safe Diet Advancement: Good      Swallow Study   General HPI: Wayne Walls is a 87 y.o. male with medical history significant for dementia, COPD, chronic polycythemia, glaucoma who is admitted to [facility] on 12/19/2020 with suspected acute ischemic stroke after presenting from home via EMS to [facility] Emergency Department for further evaluation frequent falls. Presented noncontrast CT of the head showed no evidence of acute intracranial process, including no evidence of acute ischemic infarct or acute intracranial hemorrhage, but showed evidence of a left anterior temporal cystic lesion associated with evidence of chronic mass-effect, as further described below.   Additionally, over the last 3 to 4 days, the patient's wife notes that the patient has been coughing to the point where he appears to intermittently be choking when attempting to eat food over that timeframe.  She conveys that this is a new development.  She also reports that the patient has been exhibiting progressive generalized weakness over the last several months, and is now requiring increasing assistance with performance of all of his ADLs. Pt also found to have COVID 19. Type  of Study: Bedside Swallow Evaluation Previous Swallow Assessment: none Diet Prior to this Study: Regular;Thin liquids Temperature Spikes Noted: No Respiratory Status: Room air History of Recent Intubation: No Behavior/Cognition: Alert;Cooperative;Pleasant mood Oral Cavity Assessment: Within Functional Limits Oral Care Completed by SLP: No Oral Cavity - Dentition: Adequate natural dentition;Poor condition Vision: Functional for self-feeding Self-Feeding Abilities: Able to feed self Patient Positioning: Upright in bed Baseline Vocal Quality: Normal Volitional Cough: Strong Volitional Swallow: Able to elicit    Oral/Motor/Sensory Function Overall Oral Motor/Sensory Function: Within functional limits   Ice Chips     Thin Liquid Thin Liquid: Within functional limits    Nectar Thick Nectar Thick Liquid: Not tested   Honey Thick Honey Thick Liquid: Not tested   Puree Puree: Within functional limits   Solid     Solid: Within functional limits     Herbie [place], MA [place] Pager [phone] Office [phone]  Lynann Be[place] 12/20/2020,1:25 PM

## 2020-12-20 NOTE — ED Notes (Signed)
Patient transported to MRI 

## 2020-12-20 NOTE — ED Notes (Signed)
Tele  Breakfast Ordered 

## 2020-12-20 NOTE — Progress Notes (Signed)
PROGRESS NOTE                                                                             PROGRESS NOTE                                                                                                                                                                                                             Patient Demographics:    Wayne Walls, is a 87 y.o. male, DOB - 1933/06/09, PIR:[id number]  Outpatient Primary MD for the patient is Burnard Bunting, MD    LOS - 1  Admit date - 12/19/2020    Chief Complaint  Patient presents with  . Fall       Brief Narrative     87 y.o. male with medical history significant for dementia, COPD, chronic polycythemia, glaucoma who is admitted to [facility] on 12/19/2020 with suspected acute ischemic stroke after presenting from home via EMS to [facility] Emergency Department for further evaluation frequent falls.   As well patient was found to be COVID-19 positive, with new oxygen requirement at 3 L nasal cannula, he was admitted for further work-up.   Subjective:    Wayne Walls today reported generalized weakness, some shortness of breath and cough.     Assessment  & Plan :    Principal Problem:   Stroke [facility]) Active Problems:   Frequent falls   Dysphagia   Polycythemia   Facial droop   Acute respiratory failure with hypoxia (HCC)  Acute CVA -MRI brain significant for posterior wall left centrum semiovale small infarct -RA head and neck is unremarkable -2D echo is pending -LDL is 114 -Hemoglobin A1c is 5.7 -Patient on aspirin prior to admission, he is currently on aspirin and Plavix, will need dual antiplatelet therapy for 3 weeks then Plavix alone. -PT/OT/SLP consulted  Acute hypoxic respiratory failure due to COVID-19 infection -Patient is vaccinated with [place] , but did not receive booster. -He is hypoxic on 3 L nasal cannula. -Continue with IV Decadron and remdesivir. -Was  encouraged to  use incentive spirometry and flutter valve -Continue to trend inflammatory markers  Dementia -Continue with headiness  Hyperlipidemia -LDL of 114, started on Lipitor  Glaucoma -Continue with eyedrops  SpO2: 93 % O2 Flow Rate (L/min): (S) 3 L/min  Recent Labs  Lab 12/19/20 1803 12/19/20 2000 12/20/20 0317  WBC 6.1  --  5.5  PLT 136*  --  119*  CRP  --   --  1.4*  PROCALCITON  --   --  <0.10  AST 36  --  41  ALT 20  --  22  ALKPHOS 104  --  98  BILITOT 1.3*  --  1.0  ALBUMIN 3.8  --  3.6  INR 1.0  --   --   SARSCOV2NAA  --  POSITIVE*  --        ABG     Component Value Date/Time   HCO3 26.8 12/20/2020 0653   TCO2 28 12/20/2020 0653   O2SAT 77.0 12/20/2020 0653       Condition - Extremely Guarded  Family Communication  :  Wife by phone  Code Status : Full  Consults  :  neurology  Procedures  :  none  Disposition Plan  :    Status is: Inpatient  Remains inpatient appropriate because:Hemodynamically unstable and IV treatments appropriate due to intensity of illness or inability to take PO   Dispo: The patient is from: Home              Anticipated d/c is to: Home              Anticipated d/c date is: 2 days              Patient currently is not medically stable to d/c.      DVT Prophylaxis  :  Lovenox   Lab Results  Component Value Date   PLT 119 (L) 12/20/2020    Diet :  Diet Order            Diet regular Room service appropriate? Yes; Fluid consistency: Thin  Diet effective now                  Inpatient Medications  Scheduled Meds: .  stroke: mapping our early stages of recovery book   Does not apply Once  . aspirin EC  81 mg Oral q AM  . atorvastatin  20 mg Oral Daily  . clopidogrel  75 mg Oral Daily  . dexamethasone (DECADRON) injection  6 mg Intravenous Q24H  . donepezil  5 mg Oral QHS  . latanoprost  1 drop Left Eye QHS   Continuous Infusions: . [START ON 12/21/2020] remdesivir 100 mg in NS 100 mL     PRN  Meds:.albuterol  Antibiotics  :    Anti-infectives (From admission, onward)   Start     Dose/Rate Route Frequency Ordered Stop   12/21/20 1000  remdesivir 100 mg in sodium chloride 0.9 % 100 mL IVPB       "Followed by" Linked Group Details   100 mg 200 mL/hr over 30 Minutes Intravenous Daily 12/20/20 0118 12/25/20 0959   12/20/20 0230  remdesivir 200 mg in sodium chloride 0.9% 250 mL IVPB       "Followed by" Linked Group Details   200 mg 580 mL/hr over 30 Minutes Intravenous Once 12/20/20 0118 12/20/20 [id number]       Emeline Gins Elgergawy M.D on 12/20/2020 at 4:43 PM  To page go to [email or web address]  [facility] -  Office  [phone]       Objective:   Vitals:   12/20/20 0800 12/20/20 1100 12/20/20 1245 12/20/20 1330  BP: (!) 155/88 (!) 128/95 133/85 134/87  Pulse: 74 93 77 93  Resp: (!) 25 (!) 23 (!) 23 (!) 24  Temp:      TempSrc:      SpO2: 96% 92% 98% 93%    Wt Readings from Last 3 Encounters:  01/27/19 90.6 kg  01/20/19 90.4 kg  12/10/17 97.9 kg    No intake or output data in the 24 hours ending 12/20/20 1643   Physical Exam  Awake Alert, frail, mildly confused, but answering most questions appropriately, frail Symmetrical Chest wall movement, Good air movement bilaterally, scattered Rales RRR,No Gallops,Rubs or new Murmurs, No Parasternal Heave +ve B.Sounds, Abd Soft, No tenderness, No rebound - guarding or rigidity. No Cyanosis, Clubbing or edema, No new Rash or bruise      Data Review:    CBC Recent Labs  Lab 12/19/20 1803 12/19/20 1814 12/20/20 0317 12/20/20 0653  WBC 6.1  --  5.5  --   HGB 18.1* 17.3* 17.9* 16.7  HCT 55.6* 51.0 52.0 49.0  PLT 136*  --  119*  --   MCV 101.5*  --  99.2  --   MCH 33.0  --  34.2*  --   MCHC 32.6  --  34.4  --   RDW 13.2  --  13.4  --   LYMPHSABS 0.8  --  0.9  --   MONOABS 0.8  --  0.8  --   EOSABS 0.0  --  0.0  --   BASOSABS 0.0  --  0.0  --     Recent Labs  Lab 12/19/20 1803 12/19/20 1814  12/20/20 0317 12/20/20 0653  NA 140 143 142 145  K 3.5 3.5 3.7 4.2  CL 106 106 108  --   CO2 24  --  22  --   GLUCOSE 121* 119* 116*  --   BUN 17 20 20   --   CREATININE 1.09 1.00 0.91  --   CALCIUM 8.9  --  8.7*  --   AST 36  --  41  --   ALT 20  --  22  --   ALKPHOS 104  --  98  --   BILITOT 1.3*  --  1.0  --   ALBUMIN 3.8  --  3.6  --   MG  --   --  2.2  --   CRP  --   --  1.4*  --   PROCALCITON  --   --  <0.10  --   INR 1.0  --   --   --   TSH  --   --  2.097  --   HGBA1C  --   --  5.7*  --     ------------------------------------------------------------------------------------------------------------------ Recent Labs    12/20/20 0317  CHOL 170  HDL 41  LDLCALC 114*  TRIG 75  CHOLHDL 4.1    Lab Results  Component Value Date   HGBA1C 5.7 (H) 12/20/2020   ------------------------------------------------------------------------------------------------------------------ Recent Labs    12/20/20 0317  TSH 2.097    Cardiac Enzymes No results for input(s): CKMB, TROPONINI, MYOGLOBIN in the last 168 hours.  Invalid input(s): CK ------------------------------------------------------------------------------------------------------------------ No results found for: BNP  Micro Results Recent Results (from the past 240 hour(s))  SARS CORONAVIRUS 2 (TAT 6-24 HRS) Nasopharyngeal Nasopharyngeal Swab  Status: Abnormal   Collection Time: 12/19/20  8:00 PM   Specimen: Nasopharyngeal Swab  Result Value Ref Range Status   SARS Coronavirus 2 POSITIVE (A) NEGATIVE Final    Comment: (NOTE) SARS-CoV-2 target nucleic acids are DETECTED.  The SARS-CoV-2 RNA is generally detectable in upper and lower respiratory specimens during the acute phase of infection. Positive results are indicative of the presence of SARS-CoV-2 RNA. Clinical correlation with patient history and other diagnostic information is  necessary to determine patient infection status. Positive results  do not rule out bacterial infection or co-infection with other viruses.  The expected result is Negative.  Fact Sheet for Patients: SugarRoll.be  Fact Sheet for Healthcare Providers: https://www.woods-mathews.com/  This test is not yet approved or cleared by the [place] FDA and  has been authorized for detection and/or diagnosis of SARS-CoV-2 by FDA under an Emergency Use Authorization (EUA). This EUA will remain  in effect (meaning this test can be used) for the duration of the COVID-19 declaration under Section 564(b)(1) of the Act, 21 U. S.C. section 360bbb-3(b)(1), unless the authorization is terminated or revoked sooner.   Performed at [facility], [place] [address] [place], [place] [address]     Radiology Reports CT HEAD WO CONTRAST  Result Date: 12/19/2020 CLINICAL DATA:  Neuro deficit, right facial droop and dysphagia. Status post fall EXAM: CT HEAD WITHOUT CONTRAST TECHNIQUE: Contiguous axial images were obtained from the base of the skull through the vertex without intravenous contrast. COMPARISON:  None. FINDINGS: Brain: Cerebral ventricle sizes are concordant with the degree of cerebral volume loss. Patchy and confluent areas of decreased attenuation are noted throughout the deep and periventricular white matter of the cerebral hemispheres bilaterally, compatible with chronic microvascular ischemic disease. No evidence of large-territorial acute infarction. No parenchymal hemorrhage. No mass lesion. No extra-axial collection. Large 9 cm left anterior temporal cystic lesion similar to cerebrospinal fluid. Associated mass effect on the surrounding cerebral cortex. Another cystic lesion is noted along the frontal horn of the left lateral ventricle (5:42). No midline shift. No hydrocephalus. Basilar cisterns are patent. Vascular: No hyperdense vessel. Atherosclerotic calcifications are present within the cavernous internal carotid  arteries. Skull: No acute fracture or focal lesion. Sinuses/Orbits: Paranasal sinuses and mastoid air cells are clear. The orbits are unremarkable. Other: None. IMPRESSION: 1. No acute intracranial abnormality. 2. Large 9 cm left anterior temporal cystic lesion likely representing an arachnoid cyst. Associated chronic mass effect on the surrounding frontal and temporal lobe. Differential diagnosis includes epidermoid cyst. Comparison with prior cross-sectional imaging would be of value to evaluate stability. Electronically Signed   By: Iven Finn M.D.   On: 12/19/2020 18:48   MR ANGIO HEAD WO CONTRAST  Result Date: 12/20/2020 CLINICAL DATA:  Initial evaluation for acute TIA, right-sided facial droop, falls. EXAM: MRI HEAD WITHOUT CONTRAST MRA HEAD WITHOUT CONTRAST MRA NECK WITHOUT CONTRAST TECHNIQUE: Multiplanar, multiecho pulse sequences of the brain and surrounding structures were obtained without intravenous contrast. Angiographic images of the Circle of Willis were obtained using MRA technique without intravenous contrast. Angiographic images of the neck were obtained using MRA technique without intravenous contrast. Carotid stenosis measurements (when applicable) are obtained utilizing NASCET criteria, using the distal internal carotid diameter as the denominator. COMPARISON:  Prior CT from 12/19/2020. FINDINGS: MRI HEAD FINDINGS Brain: Diffuse prominence of the CSF containing spaces compatible with generalized age-related cerebral atrophy. Patchy T2/FLAIR hyperintensity within the periventricular and deep white matter both cerebral hemispheres consistent with chronic small vessel  ischemic disease, mild in nature. Patchy involvement of the pons noted. Superimposed remote hemorrhagic lacunar infarct present at the left basal ganglia. Few additional small remote left basal ganglia and left thalamic lacunar infarcts noted. 9 mm focus of mild diffusion signal abnormality seen involving the subcortical and  deep white matter of the posterior left frontal centrum semi ovale (series 5, image 94). No discernible associated ADC correlate. Finding likely reflects a small focus of subacute small vessel ischemia. No associated hemorrhage or mass effect. No other diffusion abnormality to suggest acute or subacute ischemia. Gray-white matter differentiation maintained. No other areas of remote cortical infarction. No other foci of susceptibility artifact to suggest acute or chronic intracranial hemorrhage. Well-circumscribed cystic lesion measuring 7.3 x 5.0 x 6.9 cm seen at the left frontotemporal region. This follows CSF signal intensity on all pulse sequences, consistent with a benign arachnoid cyst. Associated mild regional mass effect with trace 2-3 mm of left-to-right shift. No hydrocephalus or ventricular trapping. No other mass lesion or mass effect. No extra-axial fluid collection. Pituitary gland and suprasellar region normal. Midline structures intact. Vascular: Major intracranial vascular flow voids are maintained. Skull and upper cervical spine: Craniocervical junction within normal limits. Bone marrow signal intensity normal. No scalp soft tissue abnormality. Sinuses/Orbits: Globes and orbital soft tissues within normal limits. Scattered mucosal thickening noted within the ethmoidal air cells and maxillary sinuses. Paranasal sinuses are otherwise clear. Trace fluid signal intensity noted within the mastoid air cells bilaterally, of doubtful significance. Inner ear structures grossly within normal limits. Other: None. MRA HEAD FINDINGS ANTERIOR CIRCULATION: Visualized distal cervical segments of the internal carotid arteries are widely patent with antegrade flow. Petrous, cavernous, and supraclinoid segments widely patent without stenosis or other abnormality. A1 segments patent bilaterally. Normal anterior communicating artery complex. Anterior cerebral arteries widely patent to their distal aspects. No M1  stenosis or occlusion. Normal MCA bifurcations. Distal MCA branches well perfused bilaterally. Mass effect with superior displacement of the left MCA by the large left-sided arachnoid cyst noted. POSTERIOR CIRCULATION: Both V4 segments widely patent to the vertebrobasilar junction. Both PICA origins patent and normal. Basilar widely patent to its distal aspect. Superior cerebellar arteries patent bilaterally. Both PCAs supplied via the basilar well perfused to their distal aspects. No intracranial aneurysm. MRA NECK FINDINGS AORTIC ARCH: Examination technically limited by lack of IV contrast. Visualized aortic arch of normal caliber with normal 3 vessel morphology. No hemodynamically significant stenosis seen about the origin of the great vessels. RIGHT CAROTID SYSTEM: Right CCA patent from its origin to the bifurcation without stenosis. Mild for age atheromatous irregularity about the right bifurcation/proximal right ICA without hemodynamically significant stenosis. Right ICA widely patent distally without stenosis, evidence for dissection, or occlusion. LEFT CAROTID SYSTEM: Left CCA patent from its origin to the bifurcation without stenosis. Mild for age atheromatous irregularity about the left bifurcation/proximal left ICA without significant stenosis. Left ICA patent distally without stenosis, evidence for dissection, or occlusion. VERTEBRAL ARTERIES: Both vertebral arteries appear to arise from the subclavian arteries. No visible proximal subclavian artery stenosis. Proximal left V1 segment not well seen due to tortuosity and lack of IV contrast. Visualized portions of the vertebral arteries are patent within the neck without stenosis, evidence for dissection or occlusion. IMPRESSION: MRI HEAD IMPRESSION: 1. 9 mm focus of mild diffusion abnormality involving the subcortical/deep white matter of the posterior left frontal centrum semi ovale, most likely reflecting a small focus of subacute small vessel ischemia.  No associated hemorrhage or mass effect.  2. No other acute intracranial abnormality. 3. 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 4. 7.3 x 5.0 x 6.9 cm benign arachnoid cyst at the left frontotemporal region with associated regional mass effect and trace 2-3 mm of left-to-right shift. MRA HEAD IMPRESSION: Negative intracranial MRA. No large vessel occlusion. No hemodynamically significant or correctable stenosis. MRA NECK IMPRESSION: Negative MRA of the neck. No hemodynamically significant or critical flow limiting stenosis within the neck. Electronically Signed   By: Jeannine Boga M.D.   On: 12/20/2020 03:35   MR ANGIO NECK WO CONTRAST  Result Date: 12/20/2020 CLINICAL DATA:  Initial evaluation for acute TIA, right-sided facial droop, falls. EXAM: MRI HEAD WITHOUT CONTRAST MRA HEAD WITHOUT CONTRAST MRA NECK WITHOUT CONTRAST TECHNIQUE: Multiplanar, multiecho pulse sequences of the brain and surrounding structures were obtained without intravenous contrast. Angiographic images of the Circle of Willis were obtained using MRA technique without intravenous contrast. Angiographic images of the neck were obtained using MRA technique without intravenous contrast. Carotid stenosis measurements (when applicable) are obtained utilizing NASCET criteria, using the distal internal carotid diameter as the denominator. COMPARISON:  Prior CT from 12/19/2020. FINDINGS: MRI HEAD FINDINGS Brain: Diffuse prominence of the CSF containing spaces compatible with generalized age-related cerebral atrophy. Patchy T2/FLAIR hyperintensity within the periventricular and deep white matter both cerebral hemispheres consistent with chronic small vessel ischemic disease, mild in nature. Patchy involvement of the pons noted. Superimposed remote hemorrhagic lacunar infarct present at the left basal ganglia. Few  additional small remote left basal ganglia and left thalamic lacunar infarcts noted. 9 mm focus of mild diffusion signal abnormality seen involving the subcortical and deep white matter of the posterior left frontal centrum semi ovale (series 5, image 94). No discernible associated ADC correlate. Finding likely reflects a small focus of subacute small vessel ischemia. No associated hemorrhage or mass effect. No other diffusion abnormality to suggest acute or subacute ischemia. Gray-white matter differentiation maintained. No other areas of remote cortical infarction. No other foci of susceptibility artifact to suggest acute or chronic intracranial hemorrhage. Well-circumscribed cystic lesion measuring 7.3 x 5.0 x 6.9 cm seen at the left frontotemporal region. This follows CSF signal intensity on all pulse sequences, consistent with a benign arachnoid cyst. Associated mild regional mass effect with trace 2-3 mm of left-to-right shift. No hydrocephalus or ventricular trapping. No other mass lesion or mass effect. No extra-axial fluid collection. Pituitary gland and suprasellar region normal. Midline structures intact. Vascular: Major intracranial vascular flow voids are maintained. Skull and upper cervical spine: Craniocervical junction within normal limits. Bone marrow signal intensity normal. No scalp soft tissue abnormality. Sinuses/Orbits: Globes and orbital soft tissues within normal limits. Scattered mucosal thickening noted within the ethmoidal air cells and maxillary sinuses. Paranasal sinuses are otherwise clear. Trace fluid signal intensity noted within the mastoid air cells bilaterally, of doubtful significance. Inner ear structures grossly within normal limits. Other: None. MRA HEAD FINDINGS ANTERIOR CIRCULATION: Visualized distal cervical segments of the internal carotid arteries are widely patent with antegrade flow. Petrous, cavernous, and supraclinoid segments widely patent without stenosis or other  abnormality. A1 segments patent bilaterally. Normal anterior communicating artery complex. Anterior cerebral arteries widely patent to their distal aspects. No M1 stenosis or occlusion. Normal MCA bifurcations. Distal MCA branches well perfused bilaterally. Mass effect with superior displacement of the left MCA by the large left-sided arachnoid cyst noted. POSTERIOR CIRCULATION: Both V4 segments widely patent  to the vertebrobasilar junction. Both PICA origins patent and normal. Basilar widely patent to its distal aspect. Superior cerebellar arteries patent bilaterally. Both PCAs supplied via the basilar well perfused to their distal aspects. No intracranial aneurysm. MRA NECK FINDINGS AORTIC ARCH: Examination technically limited by lack of IV contrast. Visualized aortic arch of normal caliber with normal 3 vessel morphology. No hemodynamically significant stenosis seen about the origin of the great vessels. RIGHT CAROTID SYSTEM: Right CCA patent from its origin to the bifurcation without stenosis. Mild for age atheromatous irregularity about the right bifurcation/proximal right ICA without hemodynamically significant stenosis. Right ICA widely patent distally without stenosis, evidence for dissection, or occlusion. LEFT CAROTID SYSTEM: Left CCA patent from its origin to the bifurcation without stenosis. Mild for age atheromatous irregularity about the left bifurcation/proximal left ICA without significant stenosis. Left ICA patent distally without stenosis, evidence for dissection, or occlusion. VERTEBRAL ARTERIES: Both vertebral arteries appear to arise from the subclavian arteries. No visible proximal subclavian artery stenosis. Proximal left V1 segment not well seen due to tortuosity and lack of IV contrast. Visualized portions of the vertebral arteries are patent within the neck without stenosis, evidence for dissection or occlusion. IMPRESSION: MRI HEAD IMPRESSION: 1. 9 mm focus of mild diffusion abnormality  involving the subcortical/deep white matter of the posterior left frontal centrum semi ovale, most likely reflecting a small focus of subacute small vessel ischemia. No associated hemorrhage or mass effect. 2. No other acute intracranial abnormality. 3. 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 4. 7.3 x 5.0 x 6.9 cm benign arachnoid cyst at the left frontotemporal region with associated regional mass effect and trace 2-3 mm of left-to-right shift. MRA HEAD IMPRESSION: Negative intracranial MRA. No large vessel occlusion. No hemodynamically significant or correctable stenosis. MRA NECK IMPRESSION: Negative MRA of the neck. No hemodynamically significant or critical flow limiting stenosis within the neck. Electronically Signed   By: Rise Mu M.D.   On: 12/20/2020 03:35   MR BRAIN WO CONTRAST  Result Date: 12/20/2020 CLINICAL DATA:  Initial evaluation for acute TIA, right-sided facial droop, falls. EXAM: MRI HEAD WITHOUT CONTRAST MRA HEAD WITHOUT CONTRAST MRA NECK WITHOUT CONTRAST TECHNIQUE: Multiplanar, multiecho pulse sequences of the brain and surrounding structures were obtained without intravenous contrast. Angiographic images of the Circle of Willis were obtained using MRA technique without intravenous contrast. Angiographic images of the neck were obtained using MRA technique without intravenous contrast. Carotid stenosis measurements (when applicable) are obtained utilizing NASCET criteria, using the distal internal carotid diameter as the denominator. COMPARISON:  Prior CT from 12/19/2020. FINDINGS: MRI HEAD FINDINGS Brain: Diffuse prominence of the CSF containing spaces compatible with generalized age-related cerebral atrophy. Patchy T2/FLAIR hyperintensity within the periventricular and deep white matter both cerebral hemispheres consistent with chronic small vessel  ischemic disease, mild in nature. Patchy involvement of the pons noted. Superimposed remote hemorrhagic lacunar infarct present at the left basal ganglia. Few additional small remote left basal ganglia and left thalamic lacunar infarcts noted. 9 mm focus of mild diffusion signal abnormality seen involving the subcortical and deep white matter of the posterior left frontal centrum semi ovale (series 5, image 94). No discernible associated ADC correlate. Finding likely reflects a small focus of subacute small vessel ischemia. No associated hemorrhage or mass effect. No other diffusion abnormality to suggest acute or subacute ischemia. Gray-white matter differentiation maintained. No other areas of remote cortical infarction.  No other foci of susceptibility artifact to suggest acute or chronic intracranial hemorrhage. Well-circumscribed cystic lesion measuring 7.3 x 5.0 x 6.9 cm seen at the left frontotemporal region. This follows CSF signal intensity on all pulse sequences, consistent with a benign arachnoid cyst. Associated mild regional mass effect with trace 2-3 mm of left-to-right shift. No hydrocephalus or ventricular trapping. No other mass lesion or mass effect. No extra-axial fluid collection. Pituitary gland and suprasellar region normal. Midline structures intact. Vascular: Major intracranial vascular flow voids are maintained. Skull and upper cervical spine: Craniocervical junction within normal limits. Bone marrow signal intensity normal. No scalp soft tissue abnormality. Sinuses/Orbits: Globes and orbital soft tissues within normal limits. Scattered mucosal thickening noted within the ethmoidal air cells and maxillary sinuses. Paranasal sinuses are otherwise clear. Trace fluid signal intensity noted within the mastoid air cells bilaterally, of doubtful significance. Inner ear structures grossly within normal limits. Other: None. MRA HEAD FINDINGS ANTERIOR CIRCULATION: Visualized distal cervical segments  of the internal carotid arteries are widely patent with antegrade flow. Petrous, cavernous, and supraclinoid segments widely patent without stenosis or other abnormality. A1 segments patent bilaterally. Normal anterior communicating artery complex. Anterior cerebral arteries widely patent to their distal aspects. No M1 stenosis or occlusion. Normal MCA bifurcations. Distal MCA branches well perfused bilaterally. Mass effect with superior displacement of the left MCA by the large left-sided arachnoid cyst noted. POSTERIOR CIRCULATION: Both V4 segments widely patent to the vertebrobasilar junction. Both PICA origins patent and normal. Basilar widely patent to its distal aspect. Superior cerebellar arteries patent bilaterally. Both PCAs supplied via the basilar well perfused to their distal aspects. No intracranial aneurysm. MRA NECK FINDINGS AORTIC ARCH: Examination technically limited by lack of IV contrast. Visualized aortic arch of normal caliber with normal 3 vessel morphology. No hemodynamically significant stenosis seen about the origin of the great vessels. RIGHT CAROTID SYSTEM: Right CCA patent from its origin to the bifurcation without stenosis. Mild for age atheromatous irregularity about the right bifurcation/proximal right ICA without hemodynamically significant stenosis. Right ICA widely patent distally without stenosis, evidence for dissection, or occlusion. LEFT CAROTID SYSTEM: Left CCA patent from its origin to the bifurcation without stenosis. Mild for age atheromatous irregularity about the left bifurcation/proximal left ICA without significant stenosis. Left ICA patent distally without stenosis, evidence for dissection, or occlusion. VERTEBRAL ARTERIES: Both vertebral arteries appear to arise from the subclavian arteries. No visible proximal subclavian artery stenosis. Proximal left V1 segment not well seen due to tortuosity and lack of IV contrast. Visualized portions of the vertebral arteries are  patent within the neck without stenosis, evidence for dissection or occlusion. IMPRESSION: MRI HEAD IMPRESSION: 1. 9 mm focus of mild diffusion abnormality involving the subcortical/deep white matter of the posterior left frontal centrum semi ovale, most likely reflecting a small focus of subacute small vessel ischemia. No associated hemorrhage or mass effect. 2. No other acute intracranial abnormality. 3. 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 4. 7.3 x 5.0 x 6.9 cm benign arachnoid cyst at the left frontotemporal region with associated regional mass effect and trace 2-3 mm of left-to-right shift. MRA HEAD IMPRESSION: Negative intracranial MRA. No large vessel occlusion. No hemodynamically significant or correctable stenosis. MRA NECK IMPRESSION: Negative MRA of the neck. No hemodynamically significant or critical flow limiting stenosis within the neck. Electronically Signed   By: Jeannine Boga M.D.   On: 12/20/2020 03:35  DG Chest Portable 1 View  Result Date: 12/19/2020 CLINICAL DATA:  Initial evaluation for acute dysphagia. EXAM: PORTABLE CHEST 1 VIEW COMPARISON:  Prior radiograph from 11/02/2016. FINDINGS: Exaggeration of the cardiac silhouette related to AP technique and shallow lung inflation. Mediastinal silhouette normal. Tortuosity the intrathoracic aorta noted. Slight left-to-right deviation of the tracheal air column at the level of the aortic knob, similar to previous. No appreciable esophageal dilatation. Lungs are hypoinflated. Streaky and linear opacities at the right greater than left lung bases felt to be most consistent with atelectasis. No other focal infiltrates. No pulmonary edema or visible pleural effusion. Left costophrenic angle incompletely visualized. No pneumothorax. No acute osseous finding.  Osteopenia noted. IMPRESSION: 1. Shallow lung inflation  with associated bibasilar atelectasis, right greater than left. 2. No other active cardiopulmonary disease. Electronically Signed   By: Jeannine Boga M.D.   On: 12/19/2020 19:39

## 2020-12-21 ENCOUNTER — Inpatient Hospital Stay (HOSPITAL_COMMUNITY): Payer: Medicare HMO

## 2020-12-21 DIAGNOSIS — I6389 Other cerebral infarction: Secondary | ICD-10-CM

## 2020-12-21 DIAGNOSIS — F0390 Unspecified dementia without behavioral disturbance: Secondary | ICD-10-CM

## 2020-12-21 DIAGNOSIS — I351 Nonrheumatic aortic (valve) insufficiency: Secondary | ICD-10-CM

## 2020-12-21 DIAGNOSIS — I371 Nonrheumatic pulmonary valve insufficiency: Secondary | ICD-10-CM

## 2020-12-21 LAB — CBC
HCT: 49.6 % (ref 39.0–52.0)
Hemoglobin: 17.6 g/dL — ABNORMAL HIGH (ref 13.0–17.0)
MCH: 34.7 pg — ABNORMAL HIGH (ref 26.0–34.0)
MCHC: 35.5 g/dL (ref 30.0–36.0)
MCV: 97.8 fL (ref 80.0–100.0)
Platelets: 127 10*3/uL — ABNORMAL LOW (ref 150–400)
RBC: 5.07 MIL/uL (ref 4.22–5.81)
RDW: 13.5 % (ref 11.5–15.5)
WBC: 6.2 10*3/uL (ref 4.0–10.5)
nRBC: 0 % (ref 0.0–0.2)

## 2020-12-21 LAB — COMPREHENSIVE METABOLIC PANEL
ALT: 21 U/L (ref 0–44)
AST: 42 U/L — ABNORMAL HIGH (ref 15–41)
Albumin: 3.2 g/dL — ABNORMAL LOW (ref 3.5–5.0)
Alkaline Phosphatase: 87 U/L (ref 38–126)
Anion gap: 10 (ref 5–15)
BUN: 22 mg/dL (ref 8–23)
CO2: 24 mmol/L (ref 22–32)
Calcium: 8.5 mg/dL — ABNORMAL LOW (ref 8.9–10.3)
Chloride: 108 mmol/L (ref 98–111)
Creatinine, Ser: 0.87 mg/dL (ref 0.61–1.24)
GFR, Estimated: >60 mL/min (ref >60)
Glucose, Bld: 109 mg/dL — ABNORMAL HIGH (ref 70–99)
Potassium: 3.8 mmol/L (ref 3.5–5.1)
Sodium: 142 mmol/L (ref 135–145)
Total Bilirubin: 1 mg/dL (ref 0.3–1.2)
Total Protein: 5.9 g/dL — ABNORMAL LOW (ref 6.5–8.1)

## 2020-12-21 LAB — ECHOCARDIOGRAM LIMITED BUBBLE STUDY
Area-P 1/2: 2.83 cm2
P 1/2 time: 320 msec
S' Lateral: 3.8 cm

## 2020-12-21 LAB — C-REACTIVE PROTEIN: CRP: 2.4 mg/dL — ABNORMAL HIGH (ref <1.0)

## 2020-12-21 LAB — D-DIMER, QUANTITATIVE: D-Dimer, Quant: 0.77 ug/mL-FEU — ABNORMAL HIGH (ref 0.00–0.50)

## 2020-12-21 LAB — PROCALCITONIN: Procalcitonin: <0.1 ng/mL

## 2020-12-21 MED ORDER — ENOXAPARIN SODIUM 40 MG/0.4ML ~~LOC~~ SOLN
40.0000 mg | SUBCUTANEOUS | Status: DC
  Administered 2020-12-21 – 2020-12-23 (×3): 40 mg via SUBCUTANEOUS
  Filled 2020-12-21 (×3): qty 0.4

## 2020-12-21 NOTE — Progress Notes (Signed)
Physical Therapy Treatment Patient Details Name: Wayne Walls MRN: [id number] DOB: October 08, 1933 Today's Date: 12/21/2020    History of Present Illness Pt is 87 y.o. male with medical history significant for dementia, COPD, chronic polycythemia, glaucoma who is admitted to [facility] on 12/19/2020 with suspected acute ischemic stroke after presenting from home via EMS to [facility] ED for further evaluation frequent falls. Tested COVID+.  MRI revealed 9 mm focus of mild diffusion abnormality involving the  subcortical/deep white matter of the posterior left frontal centrum  semi ovale, most likely reflecting a small focus of subacute small  vessel ischemia. No associated hemorrhage or mass effect.  2. No other acute intracranial abnormality.    PT Comments    Pt progressing well with therapy.  He tolerated activity on 1 L O2 with stable vitals.  Pt requiring min A and was able to ambulate 10' with RW , assist to steady, and cues for safety.  Pt pleasant and following commands.  Will need 24 hr supervision and HHPT at discharge.     Follow Up Recommendations  Home health PT;Supervision/Assistance - 24 hour     Equipment Recommendations  Rolling walker with 5" wheels    Recommendations for Other Services       Precautions / Restrictions Precautions Precautions: Fall    Mobility  Bed Mobility Overal bed mobility: Needs Assistance Bed Mobility: Supine to Sit     Supine to sit: Min assist     General bed mobility comments: increased time and min A with pt pulling up on therapist hand  Transfers Overall transfer level: Needs assistance Equipment used: 1 person hand held assist;Rolling walker (2 wheeled) Transfers: Sit to/from Stand Sit to Stand: Min assist         General transfer comment: Sit to stand x 2; min to rise  Ambulation/Gait Ambulation/Gait assistance: Min assist Gait Distance (Feet): 10 Feet (10' and 4') Assistive device: Rolling walker (2 wheeled) Gait  Pattern/deviations: Step-to pattern;Shuffle;Decreased stride length Gait velocity: decreased   General Gait Details: ambulated short distance in room with min A to ste4ady; cues for RW   Stairs             Wheelchair Mobility    Modified Rankin (Stroke Patients Only) Modified Rankin (Stroke Patients Only) Pre-Morbid Rankin Score: Slight disability Modified Rankin: Moderately severe disability     Balance Overall balance assessment: Needs assistance Sitting-balance support: No upper extremity supported;Feet supported Sitting balance-Leahy Scale: Good     Standing balance support: During functional activity;Bilateral upper extremity supported Standing balance-Leahy Scale: Poor Standing balance comment: Reliant on UE support                            Cognition Arousal/Alertness: Awake/alert Behavior During Therapy: WFL for tasks assessed/performed Overall Cognitive Status: History of cognitive impairments - at baseline                                 General Comments: Pt with dementia.  He was able to follow commands.  Oriented to self and place.      Exercises      General Comments General comments (skin integrity, edema, etc.): Pt on 1 L O2 with sats 99% rest and 94% activity      Pertinent Vitals/Pain Pain Assessment: No/denies pain    Home Living  Prior Function            PT Goals (current goals can now be found in the care plan section) Acute Rehab PT Goals Patient Stated Goal: return home PT Goal Formulation: With patient Time For Goal Achievement: 01/03/21 Potential to Achieve Goals: Good Progress towards PT goals: Progressing toward goals    Frequency    Min 4X/week      PT Plan Current plan remains appropriate    Co-evaluation              AM-PAC PT "6 Clicks" Mobility   Outcome Measure  Help needed turning from your back to your side while in a flat bed without using  bedrails?: A Little Help needed moving from lying on your back to sitting on the side of a flat bed without using bedrails?: A Little Help needed moving to and from a bed to a chair (including a wheelchair)?: A Little Help needed standing up from a chair using your arms (e.g., wheelchair or bedside chair)?: A Little Help needed to walk in hospital room?: A Little Help needed climbing 3-5 steps with a railing? : A Lot 6 Click Score: 17    End of Session Equipment Utilized During Treatment: Gait belt;Oxygen Activity Tolerance: Patient tolerated treatment well Patient left: with chair alarm set;in chair;with call bell/phone within reach Nurse Communication: Mobility status (O2 sats did well - may be able to wean o2) PT Visit Diagnosis: Unsteadiness on feet (R26.81);Other abnormalities of gait and mobility (R26.89);Difficulty in walking, not elsewhere classified (R26.2)     Time: 4580-9983 PT Time Calculation (min) (ACUTE ONLY): 23 min  Charges:  $Gait Training: 8-22 mins $Therapeutic Activity: 8-22 mins                     Abran Richard, PT Acute Rehab Services Pager [phone] [facility] [place] 12/21/2020, 12:31 PM

## 2020-12-21 NOTE — Progress Notes (Signed)
  Echocardiogram 2D Echocardiogram has been performed.  Wayne Walls 12/21/2020, 5:07 PM

## 2020-12-21 NOTE — Progress Notes (Signed)
Ok to increase prophylaxis lovenox to 40mg  SQ qday per Dr. Sloan Leiter.  Onnie Boer, PharmD, BCIDP, AAHIVP, CPP Infectious Disease Pharmacist 12/21/2020 9:24 AM

## 2020-12-21 NOTE — TOC Initial Note (Signed)
Transition of Care [facility]) - Initial/Assessment Note    Patient Details  Name: Wayne Walls MRN: [id number] Date of Birth: 1933-10-29  [facility]) CM/SW Contact:    Pollie Friar, RN Phone Number: 12/21/2020, 4:18 PM  Clinical Narrative:                 CM spoke to patients spouse over the phone about the recommended DME and [facility] services. She has no preference for [facility] services. She states pt has a walker at home and currently is refusing the 3 in 1. Wife states they have elevated commodes. TOC following and will arranged [place] services.  Expected Discharge Plan: [place] Barriers to Discharge: Continued Medical Work up   Patient Goals and CMS Choice   CMS Medicare.gov Compare Post Acute Care list provided to:: Patient Represenative (must comment) Choice offered to / list presented to : Spouse  Expected Discharge Plan and Services Expected Discharge Plan: [place]   Discharge Planning Services: CM Consult Post Acute Care Choice: [place] arrangements for the past 2 months: Single Family Home                           HH Arranged: PT,OT,Speech Therapy          Prior Living Arrangements/Services Living arrangements for the past 2 months: Single Family Home Lives with:: Spouse Patient language and need for interpreter reviewed:: Yes        Need for Family Participation in Patient Care: No (Comment) Care giver support system in place?: Yes (comment) Current home services: DME (walker) Criminal Activity/Legal Involvement Pertinent to Current Situation/Hospitalization: Yes - Comment as needed  Activities of Daily Living Home Assistive Devices/Equipment: None ADL Screening (condition at time of admission) Patient's cognitive ability adequate to safely complete daily activities?: Yes Is the patient deaf or have difficulty hearing?: No Does the patient have difficulty seeing, even when wearing glasses/contacts?:  No Does the patient have difficulty concentrating, remembering, or making decisions?: Yes Patient able to express need for assistance with ADLs?: Yes Does the patient have difficulty dressing or bathing?: No Independently performs ADLs?: Yes (appropriate for developmental age) Does the patient have difficulty walking or climbing stairs?: Yes Weakness of Legs: None Weakness of Arms/Hands: None  Permission Sought/Granted                  Emotional Assessment           Psych Involvement: No (comment)  Admission diagnosis:  Facial droop [R29.810] Stroke ([place]) [I63.9] Dementia without behavioral disturbance, unspecified dementia type ([place]) [F03.90] Patient Active Problem List   Diagnosis Date Noted  . Stroke ([place]) 12/19/2020  . Frequent falls 12/19/2020  . Dysphagia 12/19/2020  . Polycythemia 12/19/2020  . Facial droop   . Acute respiratory failure with hypoxia ([place])   . Malignant neoplasm of prostate ([place]) 01/26/2019  . Elevated prostate specific antigen (PSA) 01/04/2019  . Collagenous colitis 07/23/2018  . Diverticulosis 06/09/2018  . History of adenomatous polyp of colon 06/09/2018  . Internal hemorrhoids 06/09/2018  . Melena 10/31/2017  . OSA (obstructive sleep apnea) 07/13/2015  . Syncope 10/22/2013   PCP:  Burnard Bunting, MD Pharmacy:   [facility] [address] [place] [address] Phone: [phone] Fax: [phone]     Social Determinants of Health (SDOH) Interventions    Readmission  Risk Interventions No flowsheet data found.

## 2020-12-21 NOTE — Progress Notes (Signed)
PROGRESS NOTE                                                                                                                                                                                                             Patient Demographics:    Wayne Walls, is a 87 y.o. male, DOB - [birth year], MD:[id number]  Outpatient Primary MD for the patient is Burnard Bunting, MD   Admit date - 12/19/2020   LOS - 2  Chief Complaint  Patient presents with  . Fall       Brief Narrative: Patient is a 87 y.o. male with PMHx of dementia, COPD, chronic polycythemia-who presented to the hospital with frequent falls and right facial droop-he was found acute ischemic stroke-he was also found to have COVID-19 pneumonia with mild hypoxemia.  COVID-19 vaccinated status: Vaccinated-but not boosted.  Significant Events: 1/17>> Admit to [facility] for acute CVA/COVID-19 pneumonia with hypoxia.  Significant studies: 1/17>>Chest x-ray: Bibasilar atelectasis right>> left. 1/17>> CT head: No acute intracranial abnormality-large 9 cm arachnoid cyst. 1/18>> MRI brain: Subacute CVA of the posterior left frontal centrum semiovale. 1/18>> MRA head: No large vessel occlusion 1/18>> MRA neck: No significant stenosis. 1/18>> A1c 5.7 1/18>> LDL: 114  COVID-19 medications: Steroids: 1/17>> Remdesivir: 1/17>>  Antibiotics: None  Microbiology data: None  Procedures: None  Consults: Neurology  DVT prophylaxis: enoxaparin (LOVENOX) injection 40 mg Start: 12/21/20 1200 SCDs Start: 12/19/20 2157    Subjective:    Wayne Walls today was lying comfortably in bed-he was titrated down to 1.5 L.   Assessment  & Plan :   Acute CVA: Appears to have mild right-sided weakness-evaluated by neurology with plans for aspirin/Plavix followed by Plavix alone.  Await echo.  Work-up as above.  Acute Hypoxic Resp Failure due to Covid 19 Viral pneumonia:  Appears to have mild hypoxemia-continue steroid/Remdesivir-continue attempts to titrate down FiO2.  Fever: afebrile O2 requirements:  SpO2: 97 % O2 Flow Rate (L/min): 3 L/min   COVID-19 Labs: Recent Labs    12/20/20 0317 12/21/20 0050  DDIMER  --  0.77*  FERRITIN 676*  --   LDH 238*  --   CRP 1.4* 2.4*    No results found for: BNP  Recent Labs  Lab 12/20/20 0317 12/21/20 0050  PROCALCITON <0.10 <0.10    Lab Results  Component Value Date  SARSCOV2NAA POSITIVE (A) 12/19/2020     Prone/Incentive Spirometry: encouragedincentive spirometry use 3-4/hour.  HLD: Continue statin  Dementia: At baseline-very mildly but pleasantly confused.  Maintain delirium precautions-continue Aricept.  Glaucoma: Continue latanoprost  History of polycythemia: On antiplatelet agents.   RN pressure injury documentation: Pressure Injury 12/20/20 Buttocks Medial Stage 2 -  Partial thickness loss of dermis presenting as a shallow open injury with a red, pink wound bed without slough. Non Blanching pressure are Buttocks (Active)  12/20/20 1855  Location: Buttocks  Location Orientation: Medial  Staging: Stage 2 -  Partial thickness loss of dermis presenting as a shallow open injury with a red, pink wound bed without slough.  Wound Description (Comments): Non Blanching pressure are Buttocks  Present on Admission: Yes    ABG:    Component Value Date/Time   HCO3 26.8 12/20/2020 0653   TCO2 28 12/20/2020 0653   O2SAT 77.0 12/20/2020 0653    Vent Settings: N/A    Condition - Stable  Family Communication  :  Spouse- [phone] updated over the phone 1/19  Code Status :  Full Code  Diet :  Diet Order            Diet regular Room service appropriate? Yes; Fluid consistency: Thin  Diet effective now                  Disposition Plan  :   Status is: Inpatient  Remains inpatient appropriate because:Inpatient level of care appropriate due to severity of  illness   Dispo: The patient is from: Home              Anticipated d/c is to: Home              Anticipated d/c date is: 2 days              Patient currently is not medically stable to d/c.   Barriers to discharge: Hypoxia requiring O2 supplementation/complete 5 days of IV Remdesivir  Antimicorbials  :    Anti-infectives (From admission, onward)   Start     Dose/Rate Route Frequency Ordered Stop   12/21/20 1000  remdesivir 100 mg in sodium chloride 0.9 % 100 mL IVPB       "Followed by" Linked Group Details   100 mg 200 mL/hr over 30 Minutes Intravenous Daily 12/20/20 0118 12/25/20 0959   12/20/20 0230  remdesivir 200 mg in sodium chloride 0.9% 250 mL IVPB       "Followed by" Linked Group Details   200 mg 580 mL/hr over 30 Minutes Intravenous Once 12/20/20 0118 12/20/20 0646      Inpatient Medications  Scheduled Meds: .  stroke: mapping our early stages of recovery book   Does not apply Once  . aspirin EC  81 mg Oral q AM  . atorvastatin  20 mg Oral Daily  . clopidogrel  75 mg Oral Daily  . dexamethasone (DECADRON) injection  6 mg Intravenous Q24H  . donepezil  5 mg Oral QHS  . enoxaparin (LOVENOX) injection  40 mg Subcutaneous Q24H  . latanoprost  1 drop Left Eye QHS  . pantoprazole  40 mg Oral Daily   Continuous Infusions: . remdesivir 100 mg in NS 100 mL 100 mg (12/21/20 1024)   PRN Meds:.albuterol   Time Spent in minutes  25  See all Orders from today for further details   Oren Binet M.D on 12/21/2020 at 12:43 PM  To page go to [email or web address] - use  universal password  [facility] -  Office  [phone]    Objective:   Vitals:   12/21/20 0600 12/21/20 0742 12/21/20 0900 12/21/20 1217  BP: 130/68 (!) 141/72  116/68  Pulse: 61 66  75  Resp: 20 20  18   Temp: 98.8 F (37.1 C) 98 F (36.7 C)  98.4 F (36.9 C)  TempSrc: Oral Oral  Oral  SpO2: 96% 95%  97%  Weight:   86.6 kg   Height:   6\' 1"  (1.854 m)     Wt Readings from Last 3  Encounters:  12/21/20 86.6 kg  01/27/19 90.6 kg  01/20/19 90.4 kg    No intake or output data in the 24 hours ending 12/21/20 1243   Physical Exam Gen Exam:Alert awake-not in any distress HEENT:atraumatic, normocephalic Chest: B/L clear to auscultation anteriorly CVS:S1S2 regular Abdomen:soft non tender, non distended Extremities:no edema Neurology: minimal right sided deficits Skin: no rash   Data Review:    CBC Recent Labs  Lab 12/19/20 1803 12/19/20 1814 12/20/20 0317 12/20/20 0653 12/21/20 0050  WBC 6.1  --  5.5  --  6.2  HGB 18.1* 17.3* 17.9* 16.7 17.6*  HCT 55.6* 51.0 52.0 49.0 49.6  PLT 136*  --  119*  --  127*  MCV 101.5*  --  99.2  --  97.8  MCH 33.0  --  34.2*  --  34.7*  MCHC 32.6  --  34.4  --  35.5  RDW 13.2  --  13.4  --  13.5  LYMPHSABS 0.8  --  0.9  --   --   MONOABS 0.8  --  0.8  --   --   EOSABS 0.0  --  0.0  --   --   BASOSABS 0.0  --  0.0  --   --     Chemistries  Recent Labs  Lab 12/19/20 1803 12/19/20 1814 12/20/20 0317 12/20/20 0653 12/21/20 0050  NA 140 143 142 145 142  K 3.5 3.5 3.7 4.2 3.8  CL 106 106 108  --  108  CO2 24  --  22  --  24  GLUCOSE 121* 119* 116*  --  109*  BUN 17 20 20   --  22  CREATININE 1.09 1.00 0.91  --  0.87  CALCIUM 8.9  --  8.7*  --  8.5*  MG  --   --  2.2  --   --   AST 36  --  41  --  42*  ALT 20  --  22  --  21  ALKPHOS 104  --  98  --  87  BILITOT 1.3*  --  1.0  --  1.0   ------------------------------------------------------------------------------------------------------------------ Recent Labs    12/20/20 0317  CHOL 170  HDL 41  LDLCALC 114*  TRIG 75  CHOLHDL 4.1    Lab Results  Component Value Date   HGBA1C 5.7 (H) 12/20/2020   ------------------------------------------------------------------------------------------------------------------ Recent Labs    12/20/20 0317  TSH 2.097    ------------------------------------------------------------------------------------------------------------------ Recent Labs    12/20/20 0317  FERRITIN 676*    Coagulation profile Recent Labs  Lab 12/19/20 1803  INR 1.0    Recent Labs    12/21/20 0050  DDIMER 0.77*    Cardiac Enzymes No results for input(s): CKMB, TROPONINI, MYOGLOBIN in the last 168 hours.  Invalid input(s): CK ------------------------------------------------------------------------------------------------------------------ No results found for: BNP  Micro Results Recent Results (from the past 240 hour(s))  SARS CORONAVIRUS 2 (  TAT 6-24 HRS) Nasopharyngeal Nasopharyngeal Swab     Status: Abnormal   Collection Time: 12/19/20  8:00 PM   Specimen: Nasopharyngeal Swab  Result Value Ref Range Status   SARS Coronavirus 2 POSITIVE (A) NEGATIVE Final    Comment: (NOTE) SARS-CoV-2 target nucleic acids are DETECTED.  The SARS-CoV-2 RNA is generally detectable in upper and lower respiratory specimens during the acute phase of infection. Positive results are indicative of the presence of SARS-CoV-2 RNA. Clinical correlation with patient history and other diagnostic information is  necessary to determine patient infection status. Positive results do not rule out bacterial infection or co-infection with other viruses.  The expected result is Negative.  Fact Sheet for Patients: SugarRoll.be  Fact Sheet for Healthcare Providers: https://www.woods-mathews.com/  This test is not yet approved or cleared by the [place] FDA and  has been authorized for detection and/or diagnosis of SARS-CoV-2 by FDA under an Emergency Use Authorization (EUA). This EUA will remain  in effect (meaning this test can be used) for the duration of the COVID-19 declaration under Section 564(b)(1) of the Act, 21 U. S.C. section 360bbb-3(b)(1), unless the authorization is terminated  or revoked sooner.   Performed at [facility], [place] [address], [place], [place] [address]     Radiology Reports CT HEAD WO CONTRAST  Result Date: 12/19/2020 CLINICAL DATA:  Neuro deficit, right facial droop and dysphagia. Status post fall EXAM: CT HEAD WITHOUT CONTRAST TECHNIQUE: Contiguous axial images were obtained from the base of the skull through the vertex without intravenous contrast. COMPARISON:  None. FINDINGS: Brain: Cerebral ventricle sizes are concordant with the degree of cerebral volume loss. Patchy and confluent areas of decreased attenuation are noted throughout the deep and periventricular white matter of the cerebral hemispheres bilaterally, compatible with chronic microvascular ischemic disease. No evidence of large-territorial acute infarction. No parenchymal hemorrhage. No mass lesion. No extra-axial collection. Large 9 cm left anterior temporal cystic lesion similar to cerebrospinal fluid. Associated mass effect on the surrounding cerebral cortex. Another cystic lesion is noted along the frontal horn of the left lateral ventricle (5:42). No midline shift. No hydrocephalus. Basilar cisterns are patent. Vascular: No hyperdense vessel. Atherosclerotic calcifications are present within the cavernous internal carotid arteries. Skull: No acute fracture or focal lesion. Sinuses/Orbits: Paranasal sinuses and mastoid air cells are clear. The orbits are unremarkable. Other: None. IMPRESSION: 1. No acute intracranial abnormality. 2. Large 9 cm left anterior temporal cystic lesion likely representing an arachnoid cyst. Associated chronic mass effect on the surrounding frontal and temporal lobe. Differential diagnosis includes epidermoid cyst. Comparison with prior cross-sectional imaging would be of value to evaluate stability. Electronically Signed   By: Iven Finn M.D.   On: 12/19/2020 18:48   MR ANGIO HEAD WO CONTRAST  Result Date: 12/20/2020 CLINICAL DATA:  Initial  evaluation for acute TIA, right-sided facial droop, falls. EXAM: MRI HEAD WITHOUT CONTRAST MRA HEAD WITHOUT CONTRAST MRA NECK WITHOUT CONTRAST TECHNIQUE: Multiplanar, multiecho pulse sequences of the brain and surrounding structures were obtained without intravenous contrast. Angiographic images of the Circle of Willis were obtained using MRA technique without intravenous contrast. Angiographic images of the neck were obtained using MRA technique without intravenous contrast. Carotid stenosis measurements (when applicable) are obtained utilizing NASCET criteria, using the distal internal carotid diameter as the denominator. COMPARISON:  Prior CT from 12/19/2020. FINDINGS: MRI HEAD FINDINGS Brain: Diffuse prominence of the CSF containing spaces compatible with generalized age-related cerebral atrophy. Patchy T2/FLAIR hyperintensity within the periventricular and deep  white matter both cerebral hemispheres consistent with chronic small vessel ischemic disease, mild in nature. Patchy involvement of the pons noted. Superimposed remote hemorrhagic lacunar infarct present at the left basal ganglia. Few additional small remote left basal ganglia and left thalamic lacunar infarcts noted. 9 mm focus of mild diffusion signal abnormality seen involving the subcortical and deep white matter of the posterior left frontal centrum semi ovale (series 5, image 94). No discernible associated ADC correlate. Finding likely reflects a small focus of subacute small vessel ischemia. No associated hemorrhage or mass effect. No other diffusion abnormality to suggest acute or subacute ischemia. Gray-white matter differentiation maintained. No other areas of remote cortical infarction. No other foci of susceptibility artifact to suggest acute or chronic intracranial hemorrhage. Well-circumscribed cystic lesion measuring 7.3 x 5.0 x 6.9 cm seen at the left frontotemporal region. This follows CSF signal intensity on all pulse sequences,  consistent with a benign arachnoid cyst. Associated mild regional mass effect with trace 2-3 mm of left-to-right shift. No hydrocephalus or ventricular trapping. No other mass lesion or mass effect. No extra-axial fluid collection. Pituitary gland and suprasellar region normal. Midline structures intact. Vascular: Major intracranial vascular flow voids are maintained. Skull and upper cervical spine: Craniocervical junction within normal limits. Bone marrow signal intensity normal. No scalp soft tissue abnormality. Sinuses/Orbits: Globes and orbital soft tissues within normal limits. Scattered mucosal thickening noted within the ethmoidal air cells and maxillary sinuses. Paranasal sinuses are otherwise clear. Trace fluid signal intensity noted within the mastoid air cells bilaterally, of doubtful significance. Inner ear structures grossly within normal limits. Other: None. MRA HEAD FINDINGS ANTERIOR CIRCULATION: Visualized distal cervical segments of the internal carotid arteries are widely patent with antegrade flow. Petrous, cavernous, and supraclinoid segments widely patent without stenosis or other abnormality. A1 segments patent bilaterally. Normal anterior communicating artery complex. Anterior cerebral arteries widely patent to their distal aspects. No M1 stenosis or occlusion. Normal MCA bifurcations. Distal MCA branches well perfused bilaterally. Mass effect with superior displacement of the left MCA by the large left-sided arachnoid cyst noted. POSTERIOR CIRCULATION: Both V4 segments widely patent to the vertebrobasilar junction. Both PICA origins patent and normal. Basilar widely patent to its distal aspect. Superior cerebellar arteries patent bilaterally. Both PCAs supplied via the basilar well perfused to their distal aspects. No intracranial aneurysm. MRA NECK FINDINGS AORTIC ARCH: Examination technically limited by lack of IV contrast. Visualized aortic arch of normal caliber with normal 3 vessel  morphology. No hemodynamically significant stenosis seen about the origin of the great vessels. RIGHT CAROTID SYSTEM: Right CCA patent from its origin to the bifurcation without stenosis. Mild for age atheromatous irregularity about the right bifurcation/proximal right ICA without hemodynamically significant stenosis. Right ICA widely patent distally without stenosis, evidence for dissection, or occlusion. LEFT CAROTID SYSTEM: Left CCA patent from its origin to the bifurcation without stenosis. Mild for age atheromatous irregularity about the left bifurcation/proximal left ICA without significant stenosis. Left ICA patent distally without stenosis, evidence for dissection, or occlusion. VERTEBRAL ARTERIES: Both vertebral arteries appear to arise from the subclavian arteries. No visible proximal subclavian artery stenosis. Proximal left V1 segment not well seen due to tortuosity and lack of IV contrast. Visualized portions of the vertebral arteries are patent within the neck without stenosis, evidence for dissection or occlusion. IMPRESSION: MRI HEAD IMPRESSION: 1. 9 mm focus of mild diffusion abnormality involving the subcortical/deep white matter of the posterior left frontal centrum semi ovale, most likely reflecting a small focus of subacute  small vessel ischemia. No associated hemorrhage or mass effect. 2. No other acute intracranial abnormality. 3. 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 4. 7.3 x 5.0 x 6.9 cm benign arachnoid cyst at the left frontotemporal region with associated regional mass effect and trace 2-3 mm of left-to-right shift. MRA HEAD IMPRESSION: Negative intracranial MRA. No large vessel occlusion. No hemodynamically significant or correctable stenosis. MRA NECK IMPRESSION: Negative MRA of the neck. No hemodynamically significant or critical flow limiting stenosis  within the neck. Electronically Signed   By: Jeannine Boga M.D.   On: 12/20/2020 03:35   MR ANGIO NECK WO CONTRAST  Result Date: 12/20/2020 CLINICAL DATA:  Initial evaluation for acute TIA, right-sided facial droop, falls. EXAM: MRI HEAD WITHOUT CONTRAST MRA HEAD WITHOUT CONTRAST MRA NECK WITHOUT CONTRAST TECHNIQUE: Multiplanar, multiecho pulse sequences of the brain and surrounding structures were obtained without intravenous contrast. Angiographic images of the Circle of Willis were obtained using MRA technique without intravenous contrast. Angiographic images of the neck were obtained using MRA technique without intravenous contrast. Carotid stenosis measurements (when applicable) are obtained utilizing NASCET criteria, using the distal internal carotid diameter as the denominator. COMPARISON:  Prior CT from 12/19/2020. FINDINGS: MRI HEAD FINDINGS Brain: Diffuse prominence of the CSF containing spaces compatible with generalized age-related cerebral atrophy. Patchy T2/FLAIR hyperintensity within the periventricular and deep white matter both cerebral hemispheres consistent with chronic small vessel ischemic disease, mild in nature. Patchy involvement of the pons noted. Superimposed remote hemorrhagic lacunar infarct present at the left basal ganglia. Few additional small remote left basal ganglia and left thalamic lacunar infarcts noted. 9 mm focus of mild diffusion signal abnormality seen involving the subcortical and deep white matter of the posterior left frontal centrum semi ovale (series 5, image 94). No discernible associated ADC correlate. Finding likely reflects a small focus of subacute small vessel ischemia. No associated hemorrhage or mass effect. No other diffusion abnormality to suggest acute or subacute ischemia. Gray-white matter differentiation maintained. No other areas of remote cortical infarction. No other foci of susceptibility artifact to suggest acute or chronic intracranial  hemorrhage. Well-circumscribed cystic lesion measuring 7.3 x 5.0 x 6.9 cm seen at the left frontotemporal region. This follows CSF signal intensity on all pulse sequences, consistent with a benign arachnoid cyst. Associated mild regional mass effect with trace 2-3 mm of left-to-right shift. No hydrocephalus or ventricular trapping. No other mass lesion or mass effect. No extra-axial fluid collection. Pituitary gland and suprasellar region normal. Midline structures intact. Vascular: Major intracranial vascular flow voids are maintained. Skull and upper cervical spine: Craniocervical junction within normal limits. Bone marrow signal intensity normal. No scalp soft tissue abnormality. Sinuses/Orbits: Globes and orbital soft tissues within normal limits. Scattered mucosal thickening noted within the ethmoidal air cells and maxillary sinuses. Paranasal sinuses are otherwise clear. Trace fluid signal intensity noted within the mastoid air cells bilaterally, of doubtful significance. Inner ear structures grossly within normal limits. Other: None. MRA HEAD FINDINGS ANTERIOR CIRCULATION: Visualized distal cervical segments of the internal carotid arteries are widely patent with antegrade flow. Petrous, cavernous, and supraclinoid segments widely patent without stenosis or other abnormality. A1 segments patent bilaterally. Normal anterior communicating artery complex. Anterior cerebral arteries widely patent to their distal aspects. No M1 stenosis or occlusion. Normal MCA bifurcations. Distal MCA branches well perfused bilaterally. Mass effect with superior displacement of the left MCA by the large left-sided arachnoid  cyst noted. POSTERIOR CIRCULATION: Both V4 segments widely patent to the vertebrobasilar junction. Both PICA origins patent and normal. Basilar widely patent to its distal aspect. Superior cerebellar arteries patent bilaterally. Both PCAs supplied via the basilar well perfused to their distal aspects. No  intracranial aneurysm. MRA NECK FINDINGS AORTIC ARCH: Examination technically limited by lack of IV contrast. Visualized aortic arch of normal caliber with normal 3 vessel morphology. No hemodynamically significant stenosis seen about the origin of the great vessels. RIGHT CAROTID SYSTEM: Right CCA patent from its origin to the bifurcation without stenosis. Mild for age atheromatous irregularity about the right bifurcation/proximal right ICA without hemodynamically significant stenosis. Right ICA widely patent distally without stenosis, evidence for dissection, or occlusion. LEFT CAROTID SYSTEM: Left CCA patent from its origin to the bifurcation without stenosis. Mild for age atheromatous irregularity about the left bifurcation/proximal left ICA without significant stenosis. Left ICA patent distally without stenosis, evidence for dissection, or occlusion. VERTEBRAL ARTERIES: Both vertebral arteries appear to arise from the subclavian arteries. No visible proximal subclavian artery stenosis. Proximal left V1 segment not well seen due to tortuosity and lack of IV contrast. Visualized portions of the vertebral arteries are patent within the neck without stenosis, evidence for dissection or occlusion. IMPRESSION: MRI HEAD IMPRESSION: 1. 9 mm focus of mild diffusion abnormality involving the subcortical/deep white matter of the posterior left frontal centrum semi ovale, most likely reflecting a small focus of subacute small vessel ischemia. No associated hemorrhage or mass effect. 2. No other acute intracranial abnormality. 3. 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 4. 7.3 x 5.0 x 6.9 cm benign arachnoid cyst at the left frontotemporal region with associated regional mass effect and trace 2-3 mm of left-to-right shift. MRA HEAD IMPRESSION: Negative intracranial MRA. No large vessel occlusion.  No hemodynamically significant or correctable stenosis. MRA NECK IMPRESSION: Negative MRA of the neck. No hemodynamically significant or critical flow limiting stenosis within the neck. Electronically Signed   By: Jeannine Boga M.D.   On: 12/20/2020 03:35   MR BRAIN WO CONTRAST  Result Date: 12/20/2020 CLINICAL DATA:  Initial evaluation for acute TIA, right-sided facial droop, falls. EXAM: MRI HEAD WITHOUT CONTRAST MRA HEAD WITHOUT CONTRAST MRA NECK WITHOUT CONTRAST TECHNIQUE: Multiplanar, multiecho pulse sequences of the brain and surrounding structures were obtained without intravenous contrast. Angiographic images of the Circle of Willis were obtained using MRA technique without intravenous contrast. Angiographic images of the neck were obtained using MRA technique without intravenous contrast. Carotid stenosis measurements (when applicable) are obtained utilizing NASCET criteria, using the distal internal carotid diameter as the denominator. COMPARISON:  Prior CT from 12/19/2020. FINDINGS: MRI HEAD FINDINGS Brain: Diffuse prominence of the CSF containing spaces compatible with generalized age-related cerebral atrophy. Patchy T2/FLAIR hyperintensity within the periventricular and deep white matter both cerebral hemispheres consistent with chronic small vessel ischemic disease, mild in nature. Patchy involvement of the pons noted. Superimposed remote hemorrhagic lacunar infarct present at the left basal ganglia. Few additional small remote left basal ganglia and left thalamic lacunar infarcts noted. 9 mm focus of mild diffusion signal abnormality seen involving the subcortical and deep white matter of the posterior left frontal centrum semi ovale (series 5, image 94). No discernible associated ADC correlate. Finding likely reflects a small focus of subacute small vessel ischemia. No associated hemorrhage or mass effect. No other diffusion abnormality to suggest acute or subacute ischemia. Gray-white  matter differentiation maintained. No other areas of remote cortical infarction. No other foci of susceptibility artifact to suggest acute or chronic intracranial hemorrhage. Well-circumscribed cystic lesion measuring 7.3 x 5.0 x 6.9 cm seen at the left frontotemporal region. This follows CSF signal intensity on all pulse sequences, consistent with a benign arachnoid cyst. Associated mild regional mass effect with trace 2-3 mm of left-to-right shift. No hydrocephalus or ventricular trapping. No other mass lesion or mass effect. No extra-axial fluid collection. Pituitary gland and suprasellar region normal. Midline structures intact. Vascular: Major intracranial vascular flow voids are maintained. Skull and upper cervical spine: Craniocervical junction within normal limits. Bone marrow signal intensity normal. No scalp soft tissue abnormality. Sinuses/Orbits: Globes and orbital soft tissues within normal limits. Scattered mucosal thickening noted within the ethmoidal air cells and maxillary sinuses. Paranasal sinuses are otherwise clear. Trace fluid signal intensity noted within the mastoid air cells bilaterally, of doubtful significance. Inner ear structures grossly within normal limits. Other: None. MRA HEAD FINDINGS ANTERIOR CIRCULATION: Visualized distal cervical segments of the internal carotid arteries are widely patent with antegrade flow. Petrous, cavernous, and supraclinoid segments widely patent without stenosis or other abnormality. A1 segments patent bilaterally. Normal anterior communicating artery complex. Anterior cerebral arteries widely patent to their distal aspects. No M1 stenosis or occlusion. Normal MCA bifurcations. Distal MCA branches well perfused bilaterally. Mass effect with superior displacement of the left MCA by the large left-sided arachnoid cyst noted. POSTERIOR CIRCULATION: Both V4 segments widely patent to the vertebrobasilar junction. Both PICA origins patent and normal. Basilar  widely patent to its distal aspect. Superior cerebellar arteries patent bilaterally. Both PCAs supplied via the basilar well perfused to their distal aspects. No intracranial aneurysm. MRA NECK FINDINGS AORTIC ARCH: Examination technically limited by lack of IV contrast. Visualized aortic arch of normal caliber with normal 3 vessel morphology. No hemodynamically significant stenosis seen about the origin of the great vessels. RIGHT CAROTID SYSTEM: Right CCA patent from its origin to the bifurcation without stenosis. Mild for age atheromatous irregularity about the right bifurcation/proximal right ICA without hemodynamically significant stenosis. Right ICA widely patent distally without stenosis, evidence for dissection, or occlusion. LEFT CAROTID SYSTEM: Left CCA patent from its origin to the bifurcation without stenosis. Mild for age atheromatous irregularity about the left bifurcation/proximal left ICA without significant stenosis. Left ICA patent distally without stenosis, evidence for dissection, or occlusion. VERTEBRAL ARTERIES: Both vertebral arteries appear to arise from the subclavian arteries. No visible proximal subclavian artery stenosis. Proximal left V1 segment not well seen due to tortuosity and lack of IV contrast. Visualized portions of the vertebral arteries are patent within the neck without stenosis, evidence for dissection or occlusion. IMPRESSION: MRI HEAD IMPRESSION: 1. 9 mm focus of mild diffusion abnormality involving the subcortical/deep white matter of the posterior left frontal centrum semi ovale, most likely reflecting a small focus of subacute small vessel ischemia. No associated hemorrhage or mass effect. 2. No other acute intracranial abnormality. 3. 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 4. 7.3 x 5.0 x 6.9 cm benign arachnoid cyst at the left  frontotemporal region with associated regional mass effect and trace 2-3 mm of left-to-right shift. MRA HEAD IMPRESSION: Negative intracranial MRA. No large vessel occlusion. No hemodynamically significant or correctable stenosis. MRA NECK IMPRESSION: Negative MRA of the neck. No hemodynamically significant or critical flow limiting stenosis within the neck. Electronically Signed   By:  Jeannine Boga M.D.   On: 12/20/2020 03:35   DG Chest Portable 1 View  Result Date: 12/19/2020 CLINICAL DATA:  Initial evaluation for acute dysphagia. EXAM: PORTABLE CHEST 1 VIEW COMPARISON:  Prior radiograph from 11/02/2016. FINDINGS: Exaggeration of the cardiac silhouette related to AP technique and shallow lung inflation. Mediastinal silhouette normal. Tortuosity the intrathoracic aorta noted. Slight left-to-right deviation of the tracheal air column at the level of the aortic knob, similar to previous. No appreciable esophageal dilatation. Lungs are hypoinflated. Streaky and linear opacities at the right greater than left lung bases felt to be most consistent with atelectasis. No other focal infiltrates. No pulmonary edema or visible pleural effusion. Left costophrenic angle incompletely visualized. No pneumothorax. No acute osseous finding.  Osteopenia noted. IMPRESSION: 1. Shallow lung inflation with associated bibasilar atelectasis, right greater than left. 2. No other active cardiopulmonary disease. Electronically Signed   By: Jeannine Boga M.D.   On: 12/19/2020 19:39

## 2020-12-22 DIAGNOSIS — I351 Nonrheumatic aortic (valve) insufficiency: Secondary | ICD-10-CM | POA: Diagnosis not present

## 2020-12-22 DIAGNOSIS — I6320 Cerebral infarction due to unspecified occlusion or stenosis of unspecified precerebral arteries: Secondary | ICD-10-CM | POA: Diagnosis not present

## 2020-12-22 LAB — COMPREHENSIVE METABOLIC PANEL
ALT: 24 U/L (ref 0–44)
AST: 44 U/L — ABNORMAL HIGH (ref 15–41)
Albumin: 3.2 g/dL — ABNORMAL LOW (ref 3.5–5.0)
Alkaline Phosphatase: 93 U/L (ref 38–126)
Anion gap: 11 (ref 5–15)
BUN: 26 mg/dL — ABNORMAL HIGH (ref 8–23)
CO2: 25 mmol/L (ref 22–32)
Calcium: 8.3 mg/dL — ABNORMAL LOW (ref 8.9–10.3)
Chloride: 106 mmol/L (ref 98–111)
Creatinine, Ser: 1.12 mg/dL (ref 0.61–1.24)
GFR, Estimated: >60 mL/min (ref >60)
Glucose, Bld: 87 mg/dL (ref 70–99)
Potassium: 3.5 mmol/L (ref 3.5–5.1)
Sodium: 142 mmol/L (ref 135–145)
Total Bilirubin: 1 mg/dL (ref 0.3–1.2)
Total Protein: 5.8 g/dL — ABNORMAL LOW (ref 6.5–8.1)

## 2020-12-22 LAB — CBC
HCT: 49.6 % (ref 39.0–52.0)
Hemoglobin: 17.3 g/dL — ABNORMAL HIGH (ref 13.0–17.0)
MCH: 34.1 pg — ABNORMAL HIGH (ref 26.0–34.0)
MCHC: 34.9 g/dL (ref 30.0–36.0)
MCV: 97.6 fL (ref 80.0–100.0)
Platelets: 128 10*3/uL — ABNORMAL LOW (ref 150–400)
RBC: 5.08 MIL/uL (ref 4.22–5.81)
RDW: 13.7 % (ref 11.5–15.5)
WBC: 4.5 10*3/uL (ref 4.0–10.5)
nRBC: 0 % (ref 0.0–0.2)

## 2020-12-22 LAB — C-REACTIVE PROTEIN: CRP: 2.1 mg/dL — ABNORMAL HIGH (ref <1.0)

## 2020-12-22 LAB — METHYLMALONIC ACID, SERUM: Methylmalonic Acid, Quantitative: 252 nmol/L (ref 0–378)

## 2020-12-22 LAB — D-DIMER, QUANTITATIVE: D-Dimer, Quant: 1.02 ug/mL-FEU — ABNORMAL HIGH (ref 0.00–0.50)

## 2020-12-22 NOTE — Progress Notes (Signed)
Physical Therapy Treatment Patient Details Name: Wayne Walls MRN: [id number] DOB: Nov 19, 1933 Today's Date: 12/22/2020    History of Present Illness Pt is an 87 y.o. male admitted 12/19/20 with frequent falls and suspected stroke; tested (+) COVID-19. MRI with 59mm focus of mild diffusion abnormality involving subcortical/deep white matter of posterior L frontal centrum semi ovale; no associated hemorrhage or mass effect. PMH includes dementia, COPD, glaucoma.   PT Comments    Pt progressing well with mobility; motivated to participate, especially since he reportedly has not been out of bed yet today. Pt pleasantly confused, following simple commands appropriately. Able to perform transfer and gait training in room with RW and min guard for balance, as well as seated ADL tasks; pt with DOE 3/4 with activity requiring seated rest to recover. Continue to recommend return home to familiar environment with HHPT services if 24/7 support available.  SpO2 94-98% on RA, HR 86   Follow Up Recommendations  Home health PT;Supervision/Assistance - 24 hour     Equipment Recommendations  Rolling walker with 5" wheels    Recommendations for Other Services       Precautions / Restrictions Precautions Precautions: Fall Restrictions Weight Bearing Restrictions: No    Mobility  Bed Mobility Overal bed mobility: Needs Assistance Bed Mobility: Supine to Sit     Supine to sit: Supervision     General bed mobility comments: Increased time and effort, cues to stay on task  Transfers Overall transfer level: Needs assistance Equipment used: Rolling walker (2 wheeled) Transfers: Sit to/from Stand Sit to Stand: Min guard         General transfer comment: Increased time and effort standing from low bed height to RW, able to stand without assist, close min guard for balance; multiple sit<>stands from recliner to RW with close min guard  Ambulation/Gait Ambulation/Gait assistance: Min  guard Gait Distance (Feet): 36 Feet Assistive device: Rolling walker (2 wheeled) Gait Pattern/deviations: Step-through pattern;Decreased stride length;Trunk flexed;Shuffle Gait velocity: Decreased   General Gait Details: Slow, mildly unsteady gait with RW and min guard for balance; pt with intermittent shuffle, cues to maintain closer proximity to RW and upright posture with step length improving; increased time to navigate around obstacles in room   Stairs             Wheelchair Mobility    Modified Rankin (Stroke Patients Only) Modified Rankin (Stroke Patients Only) Pre-Morbid Rankin Score: Slight disability Modified Rankin: Moderately severe disability     Balance Overall balance assessment: Needs assistance Sitting-balance support: No upper extremity supported;Feet supported Sitting balance-Leahy Scale: Good Sitting balance - Comments: Able to perform ADL tasks (pericare, brushing teeth, washing face) seated at edge of recliner without UE support   Standing balance support: During functional activity;Bilateral upper extremity supported Standing balance-Leahy Scale: Poor Standing balance comment: Reliant on UE support                            Cognition Arousal/Alertness: Awake/alert Behavior During Therapy: WFL for tasks assessed/performed Overall Cognitive Status: History of cognitive impairments - at baseline Area of Impairment: Orientation;Attention;Memory;Following commands;Safety/judgement;Awareness;Problem solving                 Orientation Level: Disoriented to;Time;Situation Current Attention Level: Selective Memory: Decreased short-term memory Following Commands: Follows one step commands consistently Safety/Judgement: Decreased awareness of safety;Decreased awareness of deficits Awareness: Intellectual Problem Solving: Slow processing;Requires verbal cues;Requires tactile cues General Comments: Pt oriented to self  and [facility];  stating it's 1983 and unaware of COVID dx. Reoriented, then later pt unable to answer reason for admission. Following simple commands and interacting appropriately      Exercises      General Comments General comments (skin integrity, edema, etc.): SpO2 94-98% on RA, HR 86; DOE 3/4 with activity requiring seated rest breaks      Pertinent Vitals/Pain Pain Assessment: No/denies pain    Home Living                      Prior Function            PT Goals (current goals can now be found in the care plan section) Progress towards PT goals: Progressing toward goals    Frequency    Min 4X/week      PT Plan Current plan remains appropriate    Co-evaluation              AM-PAC PT "6 Clicks" Mobility   Outcome Measure  Help needed turning from your back to your side while in a flat bed without using bedrails?: None Help needed moving from lying on your back to sitting on the side of a flat bed without using bedrails?: None Help needed moving to and from a bed to a chair (including a wheelchair)?: A Little Help needed standing up from a chair using your arms (e.g., wheelchair or bedside chair)?: A Little Help needed to walk in hospital room?: A Little Help needed climbing 3-5 steps with a railing? : A Lot 6 Click Score: 19    End of Session Equipment Utilized During Treatment: Gait belt Activity Tolerance: Patient tolerated treatment well Patient left: with chair alarm set;in chair;with call bell/phone within reach Nurse Communication: Mobility status PT Visit Diagnosis: Unsteadiness on feet (R26.81);Other abnormalities of gait and mobility (R26.89);Difficulty in walking, not elsewhere classified (R26.2)     Time: 9604-5409 PT Time Calculation (min) (ACUTE ONLY): 33 min  Charges:  $Gait Training: 8-22 mins $Therapeutic Activity: 8-22 mins                    Mabeline Caras, PT, DPT Acute Rehabilitation Services  Pager [phone] Office  [place] 12/22/2020, 4:51 PM

## 2020-12-22 NOTE — Progress Notes (Signed)
  Speech Language Pathology Treatment: Dysphagia  Patient Details Name: Wayne Walls MRN: [id number] DOB: 11/24/1933 Today's Date: 12/22/2020 Time: 5945-8592 SLP Time Calculation (min) (ACUTE ONLY): 12 min  Assessment / Plan / Recommendation Clinical Impression  Pt was seen for dysphagia treatment and was cooperative throughout the session. Pt and nursing reported that the pt has been tolerating the current diet and medications without overt s/sx of aspiration. Pt remarked that the roast beef yesterday was "a bit tough", but denied any other difficulty with solids/liquids. Pt tolerated regular texture solids and thin liquids via straw using individual and consecutive swallows without symptoms of oropharyngeal dysphagia. It is recommended that the current diet (regular with thin liquids) be continued. Further skilled SLP services are not clinically indicated at this time.    HPI HPI: Wayne Walls is a 87 y.o. male with medical history significant for dementia, COPD, chronic polycythemia, glaucoma who is admitted to [facility] on 12/19/2020 with suspected acute ischemic stroke after presenting from home via EMS to [facility] for further evaluation frequent falls. Presented noncontrast CT of the head showed no evidence of acute intracranial process, including no evidence of acute ischemic infarct or acute intracranial hemorrhage, but showed evidence of a left anterior temporal cystic lesion associated with evidence of chronic mass-effect, as further described below.   Additionally, over the last 3 to 4 days, the patient's wife notes that the patient has been coughing to the point where he appears to intermittently be choking when attempting to eat food over that timeframe.  She conveys that this is a new development.  She also reports that the patient has been exhibiting progressive generalized weakness over the last several months, and is now requiring increasing assistance with  performance of all of his ADLs. Pt also found to have COVID 19.      SLP Plan  All goals met;Discharge SLP treatment due to (comment)       Recommendations  Diet recommendations: Regular;Thin liquid Liquids provided via: Cup;Straw Medication Administration: Whole meds with liquid Supervision: Staff to assist with self feeding Compensations: Slow rate Postural Changes and/or Swallow Maneuvers: Seated upright 90 degrees;Upright 30-60 min after meal                Oral Care Recommendations: Oral care BID Follow up Recommendations: Skilled Nursing facility SLP Visit Diagnosis: Dysphagia, unspecified (R13.10) Plan: All goals met;Discharge SLP treatment due to (comment)       Wayne Walls I. Hardin Negus, [place], [place] Office number [phone] Pager [place] 12/22/2020, 4:34 PM

## 2020-12-22 NOTE — Progress Notes (Addendum)
PROGRESS NOTE                                                                                                                                                                                                             Patient Demographics:    Wayne Walls, is a 87 y.o. male, DOB - 01-12-1933, QVZ:[id number]  Outpatient Primary MD for the patient is Burnard Bunting, MD   Admit date - 12/19/2020   LOS - 3  Chief Complaint  Patient presents with  . Fall       Brief Narrative: Patient is a 87 y.o. male with PMHx of dementia, COPD, chronic polycythemia-who presented to the hospital with frequent falls and right facial droop-he was found acute ischemic stroke-he was also found to have COVID-19 pneumonia with mild hypoxemia.  COVID-19 vaccinated status: Vaccinated-but not boosted.  Significant Events: 1/17>> Admit to [facility] for acute CVA/COVID-19 pneumonia with hypoxia.  Significant studies: 1/17>>Chest x-ray: Bibasilar atelectasis right>> left. 1/17>> CT head: No acute intracranial abnormality-large 9 cm arachnoid cyst. 1/18>> MRI brain: Subacute CVA of the posterior left frontal centrum semiovale. 1/18>> MRA head: No large vessel occlusion 1/18>> MRA neck: No significant stenosis. 1/18>> A1c 5.7 1/18>> LDL: 114 1/19>>Echo:EF 50-55%, small mobile density in the aortic valve  COVID-19 medications: Steroids: 1/17>> Remdesivir: 1/17>>  Antibiotics: None  Microbiology data: None  Procedures: None  Consults: Neurology, cardiology  DVT prophylaxis: enoxaparin (LOVENOX) injection 40 mg Start: 12/21/20 1200 SCDs Start: 12/19/20 2157    Subjective:    Sawyer Mentzer today was lying comfortably in bed-he was titrated down to 1.5 L.   Assessment  & Plan :   Acute CVA: Secondary to small vessel disease-discussed with Dr. Leonie Man on 1/20 regarding echo findings-he suspects that the aortic valve mobile density is a  incidental finding.  He continues to recommend aspirin/Plavix for 3 weeks followed by Plavix alone.    Mobile density on aortic valve: Seen on TTE on 1/19-concern for myxoma or fibroblastoma-awaiting cardiology opinion.  See above regarding neurology input.  Acute Hypoxic Resp Failure due to Covid 19 Viral pneumonia: Improving-titrated to room air today-remains on steroids and Remdesivir.    Fever: afebrile O2 requirements:  SpO2: 99 % O2 Flow Rate (L/min): 3 L/min   COVID-19 Labs: Recent Labs    12/20/20 0317 12/21/20 0050 12/22/20 0029  DDIMER  --  0.77* 1.02*  FERRITIN 676*  --   --   LDH 238*  --   --   CRP 1.4* 2.4* 2.1*    No results found for: BNP  Recent Labs  Lab 12/20/20 0317 12/21/20 0050  PROCALCITON <0.10 <0.10    Lab Results  Component Value Date   SARSCOV2NAA POSITIVE (A) 12/19/2020     Prone/Incentive Spirometry: encouragedincentive spirometry use 3-4/hour.  HLD: Continue statin  Dementia: At baseline-very mildly but pleasantly confused.  Maintain delirium precautions-continue Aricept.  Glaucoma: Continue latanoprost  History of polycythemia: On antiplatelet agents.  RN pressure injury documentation: Pressure Injury 12/20/20 Buttocks Medial Stage 2 -  Partial thickness loss of dermis presenting as a shallow open injury with a red, pink wound bed without slough. Non Blanching pressure are Buttocks (Active)  12/20/20 1855  Location: Buttocks  Location Orientation: Medial  Staging: Stage 2 -  Partial thickness loss of dermis presenting as a shallow open injury with a red, pink wound bed without slough.  Wound Description (Comments): Non Blanching pressure are Buttocks  Present on Admission: Yes    ABG:    Component Value Date/Time   HCO3 26.8 12/20/2020 0653   TCO2 28 12/20/2020 0653   O2SAT 77.0 12/20/2020 0653    Vent Settings: N/A    Condition - Stable  Family Communication  :  Spouse- [phone] updated over the phone  1/20  Code Status :  Full Code  Diet :  Diet Order            Diet regular Room service appropriate? Yes; Fluid consistency: Thin  Diet effective now                  Disposition Plan  :   Status is: Inpatient  Remains inpatient appropriate because:Inpatient level of care appropriate due to severity of illness   Dispo: The patient is from: Home              Anticipated d/c is to: Home              Anticipated d/c date is: 2 days              Patient currently is not medically stable to d/c.   Barriers to discharge: Hypoxia requiring O2 supplementation/complete 5 days of IV Remdesivir  Antimicorbials  :    Anti-infectives (From admission, onward)   Start     Dose/Rate Route Frequency Ordered Stop   12/21/20 1000  remdesivir 100 mg in sodium chloride 0.9 % 100 mL IVPB       "Followed by" Linked Group Details   100 mg 200 mL/hr over 30 Minutes Intravenous Daily 12/20/20 0118 12/25/20 0959   12/20/20 0230  remdesivir 200 mg in sodium chloride 0.9% 250 mL IVPB       "Followed by" Linked Group Details   200 mg 580 mL/hr over 30 Minutes Intravenous Once 12/20/20 0118 12/20/20 0646      Inpatient Medications  Scheduled Meds: .  stroke: mapping our early stages of recovery book   Does not apply Once  . aspirin EC  81 mg Oral q AM  . atorvastatin  20 mg Oral Daily  . clopidogrel  75 mg Oral Daily  . dexamethasone (DECADRON) injection  6 mg Intravenous Q24H  . donepezil  5 mg Oral QHS  . enoxaparin (LOVENOX) injection  40 mg Subcutaneous Q24H  . latanoprost  1 drop Left Eye QHS  . pantoprazole  40 mg Oral  Daily   Continuous Infusions: . remdesivir 100 mg in NS 100 mL 100 mg (12/22/20 1001)   PRN Meds:.albuterol   Time Spent in minutes  25  See all Orders from today for further details   Oren Binet M.D on 12/22/2020 at 1:44 PM  To page go to [email or web address] - use universal password  [facility] -  Office  [phone]    Objective:   Vitals:    12/22/20 0030 12/22/20 0500 12/22/20 0748 12/22/20 1222  BP: 139/84  136/78 (!) 117/101  Pulse: 77  79 77  Resp: 20  20 (!) 22  Temp: 98.1 F (36.7 C)  (!) 97.5 F (36.4 C) (!) 97.4 F (36.3 C)  TempSrc: Axillary  Oral Oral  SpO2: 96%  94% 99%  Weight:  86.6 kg    Height:        Wt Readings from Last 3 Encounters:  12/22/20 86.6 kg  01/27/19 90.6 kg  01/20/19 90.4 kg    No intake or output data in the 24 hours ending 12/22/20 1344   Physical Exam Gen Exam:Alert awake-not in any distress HEENT:atraumatic, normocephalic Chest: B/L clear to auscultation anteriorly CVS:S1S2 regular Abdomen:soft non tender, non distended Extremities:no edema Neurology: Very subtle right-sided deficits. Skin: no rash   Data Review:    CBC Recent Labs  Lab 12/19/20 1803 12/19/20 1814 12/20/20 0317 12/20/20 0653 12/21/20 0050 12/22/20 0029  WBC 6.1  --  5.5  --  6.2 4.5  HGB 18.1* 17.3* 17.9* 16.7 17.6* 17.3*  HCT 55.6* 51.0 52.0 49.0 49.6 49.6  PLT 136*  --  119*  --  127* 128*  MCV 101.5*  --  99.2  --  97.8 97.6  MCH 33.0  --  34.2*  --  34.7* 34.1*  MCHC 32.6  --  34.4  --  35.5 34.9  RDW 13.2  --  13.4  --  13.5 13.7  LYMPHSABS 0.8  --  0.9  --   --   --   MONOABS 0.8  --  0.8  --   --   --   EOSABS 0.0  --  0.0  --   --   --   BASOSABS 0.0  --  0.0  --   --   --     Chemistries  Recent Labs  Lab 12/19/20 1803 12/19/20 1814 12/20/20 0317 12/20/20 0653 12/21/20 0050 12/22/20 0029  NA 140 143 142 145 142 142  K 3.5 3.5 3.7 4.2 3.8 3.5  CL 106 106 108  --  108 106  CO2 24  --  22  --  24 25  GLUCOSE 121* 119* 116*  --  109* 87  BUN 17 20 20   --  22 26*  CREATININE 1.09 1.00 0.91  --  0.87 1.12  CALCIUM 8.9  --  8.7*  --  8.5* 8.3*  MG  --   --  2.2  --   --   --   AST 36  --  41  --  42* 44*  ALT 20  --  22  --  21 24  ALKPHOS 104  --  98  --  87 93  BILITOT 1.3*  --  1.0  --  1.0 1.0    ------------------------------------------------------------------------------------------------------------------ Recent Labs    12/20/20 0317  CHOL 170  HDL 41  LDLCALC 114*  TRIG 75  CHOLHDL 4.1    Lab Results  Component Value Date   HGBA1C 5.7 (H)  12/20/2020   ------------------------------------------------------------------------------------------------------------------ Recent Labs    12/20/20 0317  TSH 2.097   ------------------------------------------------------------------------------------------------------------------ Recent Labs    12/20/20 0317  FERRITIN 676*    Coagulation profile Recent Labs  Lab 12/19/20 1803  INR 1.0    Recent Labs    12/21/20 0050 12/22/20 0029  DDIMER 0.77* 1.02*    Cardiac Enzymes No results for input(s): CKMB, TROPONINI, MYOGLOBIN in the last 168 hours.  Invalid input(s): CK ------------------------------------------------------------------------------------------------------------------ No results found for: BNP  Micro Results Recent Results (from the past 240 hour(s))  SARS CORONAVIRUS 2 (TAT 6-24 HRS) Nasopharyngeal Nasopharyngeal Swab     Status: Abnormal   Collection Time: 12/19/20  8:00 PM   Specimen: Nasopharyngeal Swab  Result Value Ref Range Status   SARS Coronavirus 2 POSITIVE (A) NEGATIVE Final    Comment: (NOTE) SARS-CoV-2 target nucleic acids are DETECTED.  The SARS-CoV-2 RNA is generally detectable in upper and lower respiratory specimens during the acute phase of infection. Positive results are indicative of the presence of SARS-CoV-2 RNA. Clinical correlation with patient history and other diagnostic information is  necessary to determine patient infection status. Positive results do not rule out bacterial infection or co-infection with other viruses.  The expected result is Negative.  Fact Sheet for Patients: HairSlick.no  Fact Sheet for Healthcare  Providers: quierodirigir.com  This test is not yet approved or cleared by the [place] FDA and  has been authorized for detection and/or diagnosis of SARS-CoV-2 by FDA under an Emergency Use Authorization (EUA). This EUA will remain  in effect (meaning this test can be used) for the duration of the COVID-19 declaration under Section 564(b)(1) of the Act, 21 U. S.C. section 360bbb-3(b)(1), unless the authorization is terminated or revoked sooner.   Performed at [facility], [address] [place], [place] [address]     Radiology Reports CT HEAD WO CONTRAST  Result Date: 12/19/2020 CLINICAL DATA:  Neuro deficit, right facial droop and dysphagia. Status post fall EXAM: CT HEAD WITHOUT CONTRAST TECHNIQUE: Contiguous axial images were obtained from the base of the skull through the vertex without intravenous contrast. COMPARISON:  None. FINDINGS: Brain: Cerebral ventricle sizes are concordant with the degree of cerebral volume loss. Patchy and confluent areas of decreased attenuation are noted throughout the deep and periventricular white matter of the cerebral hemispheres bilaterally, compatible with chronic microvascular ischemic disease. No evidence of large-territorial acute infarction. No parenchymal hemorrhage. No mass lesion. No extra-axial collection. Large 9 cm left anterior temporal cystic lesion similar to cerebrospinal fluid. Associated mass effect on the surrounding cerebral cortex. Another cystic lesion is noted along the frontal horn of the left lateral ventricle (5:42). No midline shift. No hydrocephalus. Basilar cisterns are patent. Vascular: No hyperdense vessel. Atherosclerotic calcifications are present within the cavernous internal carotid arteries. Skull: No acute fracture or focal lesion. Sinuses/Orbits: Paranasal sinuses and mastoid air cells are clear. The orbits are unremarkable. Other: None. IMPRESSION: 1. No acute intracranial  abnormality. 2. Large 9 cm left anterior temporal cystic lesion likely representing an arachnoid cyst. Associated chronic mass effect on the surrounding frontal and temporal lobe. Differential diagnosis includes epidermoid cyst. Comparison with prior cross-sectional imaging would be of value to evaluate stability. Electronically Signed   By: Tish Frederickson M.D.   On: 12/19/2020 18:48   MR ANGIO HEAD WO CONTRAST  Result Date: 12/20/2020 CLINICAL DATA:  Initial evaluation for acute TIA, right-sided facial droop, falls. EXAM: MRI HEAD WITHOUT CONTRAST MRA HEAD WITHOUT CONTRAST MRA  NECK WITHOUT CONTRAST TECHNIQUE: Multiplanar, multiecho pulse sequences of the brain and surrounding structures were obtained without intravenous contrast. Angiographic images of the Circle of Willis were obtained using MRA technique without intravenous contrast. Angiographic images of the neck were obtained using MRA technique without intravenous contrast. Carotid stenosis measurements (when applicable) are obtained utilizing NASCET criteria, using the distal internal carotid diameter as the denominator. COMPARISON:  Prior CT from 12/19/2020. FINDINGS: MRI HEAD FINDINGS Brain: Diffuse prominence of the CSF containing spaces compatible with generalized age-related cerebral atrophy. Patchy T2/FLAIR hyperintensity within the periventricular and deep white matter both cerebral hemispheres consistent with chronic small vessel ischemic disease, mild in nature. Patchy involvement of the pons noted. Superimposed remote hemorrhagic lacunar infarct present at the left basal ganglia. Few additional small remote left basal ganglia and left thalamic lacunar infarcts noted. 9 mm focus of mild diffusion signal abnormality seen involving the subcortical and deep white matter of the posterior left frontal centrum semi ovale (series 5, image 94). No discernible associated ADC correlate. Finding likely reflects a small focus of subacute small vessel  ischemia. No associated hemorrhage or mass effect. No other diffusion abnormality to suggest acute or subacute ischemia. Gray-white matter differentiation maintained. No other areas of remote cortical infarction. No other foci of susceptibility artifact to suggest acute or chronic intracranial hemorrhage. Well-circumscribed cystic lesion measuring 7.3 x 5.0 x 6.9 cm seen at the left frontotemporal region. This follows CSF signal intensity on all pulse sequences, consistent with a benign arachnoid cyst. Associated mild regional mass effect with trace 2-3 mm of left-to-right shift. No hydrocephalus or ventricular trapping. No other mass lesion or mass effect. No extra-axial fluid collection. Pituitary gland and suprasellar region normal. Midline structures intact. Vascular: Major intracranial vascular flow voids are maintained. Skull and upper cervical spine: Craniocervical junction within normal limits. Bone marrow signal intensity normal. No scalp soft tissue abnormality. Sinuses/Orbits: Globes and orbital soft tissues within normal limits. Scattered mucosal thickening noted within the ethmoidal air cells and maxillary sinuses. Paranasal sinuses are otherwise clear. Trace fluid signal intensity noted within the mastoid air cells bilaterally, of doubtful significance. Inner ear structures grossly within normal limits. Other: None. MRA HEAD FINDINGS ANTERIOR CIRCULATION: Visualized distal cervical segments of the internal carotid arteries are widely patent with antegrade flow. Petrous, cavernous, and supraclinoid segments widely patent without stenosis or other abnormality. A1 segments patent bilaterally. Normal anterior communicating artery complex. Anterior cerebral arteries widely patent to their distal aspects. No M1 stenosis or occlusion. Normal MCA bifurcations. Distal MCA branches well perfused bilaterally. Mass effect with superior displacement of the left MCA by the large left-sided arachnoid cyst noted.  POSTERIOR CIRCULATION: Both V4 segments widely patent to the vertebrobasilar junction. Both PICA origins patent and normal. Basilar widely patent to its distal aspect. Superior cerebellar arteries patent bilaterally. Both PCAs supplied via the basilar well perfused to their distal aspects. No intracranial aneurysm. MRA NECK FINDINGS AORTIC ARCH: Examination technically limited by lack of IV contrast. Visualized aortic arch of normal caliber with normal 3 vessel morphology. No hemodynamically significant stenosis seen about the origin of the great vessels. RIGHT CAROTID SYSTEM: Right CCA patent from its origin to the bifurcation without stenosis. Mild for age atheromatous irregularity about the right bifurcation/proximal right ICA without hemodynamically significant stenosis. Right ICA widely patent distally without stenosis, evidence for dissection, or occlusion. LEFT CAROTID SYSTEM: Left CCA patent from its origin to the bifurcation without stenosis. Mild for age atheromatous irregularity about the left bifurcation/proximal left ICA  without significant stenosis. Left ICA patent distally without stenosis, evidence for dissection, or occlusion. VERTEBRAL ARTERIES: Both vertebral arteries appear to arise from the subclavian arteries. No visible proximal subclavian artery stenosis. Proximal left V1 segment not well seen due to tortuosity and lack of IV contrast. Visualized portions of the vertebral arteries are patent within the neck without stenosis, evidence for dissection or occlusion. IMPRESSION: MRI HEAD IMPRESSION: 1. 9 mm focus of mild diffusion abnormality involving the subcortical/deep white matter of the posterior left frontal centrum semi ovale, most likely reflecting a small focus of subacute small vessel ischemia. No associated hemorrhage or mass effect. 2. No other acute intracranial abnormality. 3. Underlying age-related cerebral atrophy with mild chronic small vessel ischemic disease, with remote  lacunar infarcts involving the left basal ganglia and left thalamus. 4. 7.3 x 5.0 x 6.9 cm benign arachnoid cyst at the left frontotemporal region with associated regional mass effect and trace 2-3 mm of left-to-right shift. MRA HEAD IMPRESSION: Negative intracranial MRA. No large vessel occlusion. No hemodynamically significant or correctable stenosis. MRA NECK IMPRESSION: Negative MRA of the neck. No hemodynamically significant or critical flow limiting stenosis within the neck. Electronically Signed   By: Jeannine Boga M.D.   On: 12/20/2020 03:35   MR ANGIO NECK WO CONTRAST  Result Date: 12/20/2020 CLINICAL DATA:  Initial evaluation for acute TIA, right-sided facial droop, falls. EXAM: MRI HEAD WITHOUT CONTRAST MRA HEAD WITHOUT CONTRAST MRA NECK WITHOUT CONTRAST TECHNIQUE: Multiplanar, multiecho pulse sequences of the brain and surrounding structures were obtained without intravenous contrast. Angiographic images of the Circle of Willis were obtained using MRA technique without intravenous contrast. Angiographic images of the neck were obtained using MRA technique without intravenous contrast. Carotid stenosis measurements (when applicable) are obtained utilizing NASCET criteria, using the distal internal carotid diameter as the denominator. COMPARISON:  Prior CT from 12/19/2020. FINDINGS: MRI HEAD FINDINGS Brain: Diffuse prominence of the CSF containing spaces compatible with generalized age-related cerebral atrophy. Patchy T2/FLAIR hyperintensity within the periventricular and deep white matter both cerebral hemispheres consistent with chronic small vessel ischemic disease, mild in nature. Patchy involvement of the pons noted. Superimposed remote hemorrhagic lacunar infarct present at the left basal ganglia. Few additional small remote left basal ganglia and left thalamic lacunar infarcts noted. 9 mm focus of mild diffusion signal abnormality seen involving the subcortical and deep white matter of  the posterior left frontal centrum semi ovale (series 5, image 94). No discernible associated ADC correlate. Finding likely reflects a small focus of subacute small vessel ischemia. No associated hemorrhage or mass effect. No other diffusion abnormality to suggest acute or subacute ischemia. Gray-white matter differentiation maintained. No other areas of remote cortical infarction. No other foci of susceptibility artifact to suggest acute or chronic intracranial hemorrhage. Well-circumscribed cystic lesion measuring 7.3 x 5.0 x 6.9 cm seen at the left frontotemporal region. This follows CSF signal intensity on all pulse sequences, consistent with a benign arachnoid cyst. Associated mild regional mass effect with trace 2-3 mm of left-to-right shift. No hydrocephalus or ventricular trapping. No other mass lesion or mass effect. No extra-axial fluid collection. Pituitary gland and suprasellar region normal. Midline structures intact. Vascular: Major intracranial vascular flow voids are maintained. Skull and upper cervical spine: Craniocervical junction within normal limits. Bone marrow signal intensity normal. No scalp soft tissue abnormality. Sinuses/Orbits: Globes and orbital soft tissues within normal limits. Scattered mucosal thickening noted within the ethmoidal air cells and maxillary sinuses. Paranasal sinuses are otherwise clear. Trace fluid signal  intensity noted within the mastoid air cells bilaterally, of doubtful significance. Inner ear structures grossly within normal limits. Other: None. MRA HEAD FINDINGS ANTERIOR CIRCULATION: Visualized distal cervical segments of the internal carotid arteries are widely patent with antegrade flow. Petrous, cavernous, and supraclinoid segments widely patent without stenosis or other abnormality. A1 segments patent bilaterally. Normal anterior communicating artery complex. Anterior cerebral arteries widely patent to their distal aspects. No M1 stenosis or occlusion.  Normal MCA bifurcations. Distal MCA branches well perfused bilaterally. Mass effect with superior displacement of the left MCA by the large left-sided arachnoid cyst noted. POSTERIOR CIRCULATION: Both V4 segments widely patent to the vertebrobasilar junction. Both PICA origins patent and normal. Basilar widely patent to its distal aspect. Superior cerebellar arteries patent bilaterally. Both PCAs supplied via the basilar well perfused to their distal aspects. No intracranial aneurysm. MRA NECK FINDINGS AORTIC ARCH: Examination technically limited by lack of IV contrast. Visualized aortic arch of normal caliber with normal 3 vessel morphology. No hemodynamically significant stenosis seen about the origin of the great vessels. RIGHT CAROTID SYSTEM: Right CCA patent from its origin to the bifurcation without stenosis. Mild for age atheromatous irregularity about the right bifurcation/proximal right ICA without hemodynamically significant stenosis. Right ICA widely patent distally without stenosis, evidence for dissection, or occlusion. LEFT CAROTID SYSTEM: Left CCA patent from its origin to the bifurcation without stenosis. Mild for age atheromatous irregularity about the left bifurcation/proximal left ICA without significant stenosis. Left ICA patent distally without stenosis, evidence for dissection, or occlusion. VERTEBRAL ARTERIES: Both vertebral arteries appear to arise from the subclavian arteries. No visible proximal subclavian artery stenosis. Proximal left V1 segment not well seen due to tortuosity and lack of IV contrast. Visualized portions of the vertebral arteries are patent within the neck without stenosis, evidence for dissection or occlusion. IMPRESSION: MRI HEAD IMPRESSION: 1. 9 mm focus of mild diffusion abnormality involving the subcortical/deep white matter of the posterior left frontal centrum semi ovale, most likely reflecting a small focus of subacute small vessel ischemia. No associated  hemorrhage or mass effect. 2. No other acute intracranial abnormality. 3. 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 4. 7.3 x 5.0 x 6.9 cm benign arachnoid cyst at the left frontotemporal region with associated regional mass effect and trace 2-3 mm of left-to-right shift. MRA HEAD IMPRESSION: Negative intracranial MRA. No large vessel occlusion. No hemodynamically significant or correctable stenosis. MRA NECK IMPRESSION: Negative MRA of the neck. No hemodynamically significant or critical flow limiting stenosis within the neck. Electronically Signed   By: Jeannine Boga M.D.   On: 12/20/2020 03:35   MR BRAIN WO CONTRAST  Result Date: 12/20/2020 CLINICAL DATA:  Initial evaluation for acute TIA, right-sided facial droop, falls. EXAM: MRI HEAD WITHOUT CONTRAST MRA HEAD WITHOUT CONTRAST MRA NECK WITHOUT CONTRAST TECHNIQUE: Multiplanar, multiecho pulse sequences of the brain and surrounding structures were obtained without intravenous contrast. Angiographic images of the Circle of Willis were obtained using MRA technique without intravenous contrast. Angiographic images of the neck were obtained using MRA technique without intravenous contrast. Carotid stenosis measurements (when applicable) are obtained utilizing NASCET criteria, using the distal internal carotid diameter as the denominator. COMPARISON:  Prior CT from 12/19/2020. FINDINGS: MRI HEAD FINDINGS Brain: Diffuse prominence of the CSF containing spaces compatible with generalized age-related cerebral atrophy. Patchy T2/FLAIR hyperintensity within the periventricular and deep white matter both cerebral hemispheres consistent with chronic small  vessel ischemic disease, mild in nature. Patchy involvement of the pons noted. Superimposed remote hemorrhagic lacunar infarct present at the left basal ganglia. Few additional small  remote left basal ganglia and left thalamic lacunar infarcts noted. 9 mm focus of mild diffusion signal abnormality seen involving the subcortical and deep white matter of the posterior left frontal centrum semi ovale (series 5, image 94). No discernible associated ADC correlate. Finding likely reflects a small focus of subacute small vessel ischemia. No associated hemorrhage or mass effect. No other diffusion abnormality to suggest acute or subacute ischemia. Gray-white matter differentiation maintained. No other areas of remote cortical infarction. No other foci of susceptibility artifact to suggest acute or chronic intracranial hemorrhage. Well-circumscribed cystic lesion measuring 7.3 x 5.0 x 6.9 cm seen at the left frontotemporal region. This follows CSF signal intensity on all pulse sequences, consistent with a benign arachnoid cyst. Associated mild regional mass effect with trace 2-3 mm of left-to-right shift. No hydrocephalus or ventricular trapping. No other mass lesion or mass effect. No extra-axial fluid collection. Pituitary gland and suprasellar region normal. Midline structures intact. Vascular: Major intracranial vascular flow voids are maintained. Skull and upper cervical spine: Craniocervical junction within normal limits. Bone marrow signal intensity normal. No scalp soft tissue abnormality. Sinuses/Orbits: Globes and orbital soft tissues within normal limits. Scattered mucosal thickening noted within the ethmoidal air cells and maxillary sinuses. Paranasal sinuses are otherwise clear. Trace fluid signal intensity noted within the mastoid air cells bilaterally, of doubtful significance. Inner ear structures grossly within normal limits. Other: None. MRA HEAD FINDINGS ANTERIOR CIRCULATION: Visualized distal cervical segments of the internal carotid arteries are widely patent with antegrade flow. Petrous, cavernous, and supraclinoid segments widely patent without stenosis or other abnormality. A1  segments patent bilaterally. Normal anterior communicating artery complex. Anterior cerebral arteries widely patent to their distal aspects. No M1 stenosis or occlusion. Normal MCA bifurcations. Distal MCA branches well perfused bilaterally. Mass effect with superior displacement of the left MCA by the large left-sided arachnoid cyst noted. POSTERIOR CIRCULATION: Both V4 segments widely patent to the vertebrobasilar junction. Both PICA origins patent and normal. Basilar widely patent to its distal aspect. Superior cerebellar arteries patent bilaterally. Both PCAs supplied via the basilar well perfused to their distal aspects. No intracranial aneurysm. MRA NECK FINDINGS AORTIC ARCH: Examination technically limited by lack of IV contrast. Visualized aortic arch of normal caliber with normal 3 vessel morphology. No hemodynamically significant stenosis seen about the origin of the great vessels. RIGHT CAROTID SYSTEM: Right CCA patent from its origin to the bifurcation without stenosis. Mild for age atheromatous irregularity about the right bifurcation/proximal right ICA without hemodynamically significant stenosis. Right ICA widely patent distally without stenosis, evidence for dissection, or occlusion. LEFT CAROTID SYSTEM: Left CCA patent from its origin to the bifurcation without stenosis. Mild for age atheromatous irregularity about the left bifurcation/proximal left ICA without significant stenosis. Left ICA patent distally without stenosis, evidence for dissection, or occlusion. VERTEBRAL ARTERIES: Both vertebral arteries appear to arise from the subclavian arteries. No visible proximal subclavian artery stenosis. Proximal left V1 segment not well seen due to tortuosity and lack of IV contrast. Visualized portions of the vertebral arteries are patent within the neck without stenosis, evidence for dissection or occlusion. IMPRESSION: MRI HEAD IMPRESSION: 1. 9 mm focus of mild diffusion abnormality involving the  subcortical/deep white matter of the posterior left frontal centrum semi ovale, most likely reflecting a small focus of subacute small vessel ischemia. No associated hemorrhage or mass  effect. 2. No other acute intracranial abnormality. 3. 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 4. 7.3 x 5.0 x 6.9 cm benign arachnoid cyst at the left frontotemporal region with associated regional mass effect and trace 2-3 mm of left-to-right shift. MRA HEAD IMPRESSION: Negative intracranial MRA. No large vessel occlusion. No hemodynamically significant or correctable stenosis. MRA NECK IMPRESSION: Negative MRA of the neck. No hemodynamically significant or critical flow limiting stenosis within the neck. Electronically Signed   By: Jeannine Boga M.D.   On: 12/20/2020 03:35   DG Chest Portable 1 View  Result Date: 12/19/2020 CLINICAL DATA:  Initial evaluation for acute dysphagia. EXAM: PORTABLE CHEST 1 VIEW COMPARISON:  Prior radiograph from 11/02/2016. FINDINGS: Exaggeration of the cardiac silhouette related to AP technique and shallow lung inflation. Mediastinal silhouette normal. Tortuosity the intrathoracic aorta noted. Slight left-to-right deviation of the tracheal air column at the level of the aortic knob, similar to previous. No appreciable esophageal dilatation. Lungs are hypoinflated. Streaky and linear opacities at the right greater than left lung bases felt to be most consistent with atelectasis. No other focal infiltrates. No pulmonary edema or visible pleural effusion. Left costophrenic angle incompletely visualized. No pneumothorax. No acute osseous finding.  Osteopenia noted. IMPRESSION: 1. Shallow lung inflation with associated bibasilar atelectasis, right greater than left. 2. No other active cardiopulmonary disease. Electronically Signed   By: Jeannine Boga M.D.   On:  12/19/2020 19:39   ECHOCARDIOGRAM LIMITED BUBBLE STUDY  Result Date: 12/21/2020    ECHOCARDIOGRAM LIMITED REPORT   Patient Name:   RYHEEM FERRONI Date of Exam: 12/21/2020 Medical Rec #:  [id number]       Height:       73.0 in Accession #:    [id number]      Weight:       191.0 lb Date of Birth:  05-18-1933        BSA:          2.110 m Patient Age:    75 years        BP:           112/66 mmHg Patient Gender: M               HR:           65 bpm. Exam Location:  Inpatient Procedure: Limited Echo, Limited Color Doppler and Cardiac Doppler Indications:    stroke 434.91  History:        Patient has no prior history of Echocardiogram examinations.                 Covid; Risk Factors:Sleep Apnea.  Sonographer:    Johny Chess Referring Phys: [id number] JUSTIN B HOWERTER  Sonographer Comments: Image acquisition challenging due to uncooperative patient and Image acquisition challenging due to respiratory motion. IMPRESSIONS  1. Left ventricular ejection fraction, by estimation, is 50 to 55%. The left ventricle has low normal function. Left ventricular diastolic parameters are consistent with Grade I diastolic dysfunction (impaired relaxation).  2. There is a small (1.1 x 0.4 cm) mobile density on the aortic valve passing between the LVOT and aorta. It appears to be on the left coronary cusp. Recommend TEE to better evaluate. The aortic valve is tricuspid. Aortic valve regurgitation is moderate.  3. There is normal pulmonary artery systolic pressure.  4. The inferior vena cava is normal in size with <50% respiratory  variability, suggesting right atrial pressure of 8 mmHg.  5. Agitated saline contrast bubble study was negative, with no evidence of any interatrial shunt. FINDINGS  Left Ventricle: Left ventricular ejection fraction, by estimation, is 50 to 55%. The left ventricle has low normal function. Left ventricular diastolic parameters are consistent with Grade I diastolic dysfunction (impaired relaxation). Normal left  ventricular filling pressure. Right Ventricle: There is normal pulmonary artery systolic pressure. The tricuspid regurgitant velocity is 1.95 m/s, and with an assumed right atrial pressure of 8 mmHg, the estimated right ventricular systolic pressure is 123XX123 mmHg. Tricuspid Valve: Tricuspid valve regurgitation is trivial. Aortic Valve: There is a small (1.1 x 0.4 cm) mobile density on the aortic valve passing between the LVOT and aorta. It appears to be on the left coronary cusp. Recommend TEE to better evaluate. The aortic valve is tricuspid. Aortic valve regurgitation is moderate. Aortic regurgitation PHT measures 320 msec. Pulmonic Valve: Pulmonic valve regurgitation is mild. Venous: The inferior vena cava is normal in size with less than 50% respiratory variability, suggesting right atrial pressure of 8 mmHg. IAS/Shunts: Agitated saline contrast was given intravenously to evaluate for intracardiac shunting. Agitated saline contrast bubble study was negative, with no evidence of any interatrial shunt. LEFT VENTRICLE PLAX 2D LVIDd:         5.10 cm  Diastology LVIDs:         3.80 cm  LV e' medial:   6.74 cm/s LV PW:         0.90 cm  LV E/e' medial: 8.3 LV IVS:        1.00 cm LVOT diam:     2.30 cm LVOT Area:     4.15 cm  IVC IVC diam: 1.80 cm LEFT ATRIUM         Index LA diam:    4.60 cm 2.18 cm/m  AORTIC VALVE AI PHT:      320 msec  AORTA Ao Asc diam: 3.60 cm MITRAL VALVE               TRICUSPID VALVE MV Area (PHT): 2.83 cm    TR Peak grad:   15.2 mmHg MV Decel Time: 268 msec    TR Vmax:        195.00 cm/s MV E velocity: 55.70 cm/s MV A velocity: 73.30 cm/s  SHUNTS MV E/A ratio:  0.76        Systemic Diam: 2.30 cm Skeet Latch MD Electronically signed by Skeet Latch MD Signature Date/Time: 12/21/2020/6:09:09 PM    Final

## 2020-12-22 NOTE — Consult Note (Addendum)
Cardiology Consultation:   Patient ID: Wayne Walls MRN: [id number]; DOB: 1933/05/26  Admit date: 12/19/2020 Date of Consult: 12/22/2020  Primary Care Provider: Burnard Bunting, MD [facility] Cardiologist: No primary care provider on file. DR. Meda Coffee and Dr. Lona [facility] Electrophysiologist:  None    Patient Profile:   Wayne Walls is a 87 y.o. male with a hx of syncope, COPD, tobacco use, OSA on CPAP, HTN , prostate cancer, chronic polycythemia, dementia who is being seen today for the evaluation of abnormal echo at the request of Dr. Sloan Leiter.  History of Present Illness:   Wayne Walls with hx of syncope in 2014 and neg nuc study at that time.  He had been seeing Dr. Radford Pax for sleep apnea and last visit 01/20/19.  In 2014 no stenosis of carotid arteries.    Pt presented to ER on 12/20/19 with recurrent falls and Rt sided mouth droop and difficulty speaking.  Had trouble eating and drinking as well.  He was started on 324 mg ASA. His wife felt the falls were from tripping.  Admitted with CVA presenting outside of window for tPA.   Pt also with acute hypoxia. Possibly due to aspiration.    As part of admit pt had COVID test and was positive for COVID 19.  His sp02 was in high 80s on RA.  02 was added. Pt placed on steroids and Remdesivir.    MRI scan shows a small left frontal corona radiator infarct. There is incidental large left temporal arachnoid cyst. MRI of the brain and neck both did not show any large vessel stenosis or occlusion.   As part of CVA workup pt had echo with bubble study EF 50-55%, G1DD, +small (1.1 X 0.4 cm) mobile density on aortic valve passing between the LVOT and aorta. Appears to be on the left coronary cusp.  Moderate AR. TEE recommended.  Negative bubble study.    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  RBBB and LAFB date back to 2014.   Telemetry:  Telemetry was personally  reviewed and demonstrates:  SR in the 60s with RBBB  Na 142, K+ 3.5 BUN 26 Cr 1.12 AST 44  CRP 2.1  Hgb 17.3 WBC 4.5 plts 128 ddimer 1.02  CK total 552, LDH 238  LDL 114, HDL 41  hgb A1c 5.7   PCXR IMPRESSION: 1. Shallow lung inflation with associated bibasilar atelectasis, right greater than left. 2. No other active cardiopulmonary disease  BP 117/101 to 134/72   P 80,  R 24   Past Medical History:  Diagnosis Date  . COPD (chronic obstructive pulmonary disease) ([place])   . Dementia ([place])   . Depression   . Osteoarthrosis, unspecified whether generalized or localized, unspecified site   . Prostate cancer ([place])   . Syncope    Carotids, cardiology  . Unspecified glaucoma(365.9)   . Unspecified sleep apnea    Chronic    Past Surgical History:  Procedure Laterality Date  . HERNIA REPAIR       Home Medications:  Prior to Admission medications   Medication Sig Start Date End Date Taking? Authorizing Provider  aspirin 81 MG tablet Take 81 mg by mouth in the morning.   Yes [provider]  donepezil (ARICEPT) 5 MG tablet Take 5 mg by mouth at bedtime. 12/05/17   [provider]  hydrocortisone 2.5 % cream Apply 1  application topically daily. 05/21/16   [provider]  ketoconazole (NIZORAL) 2 % cream Apply 1 application topically daily as needed. 03/27/16   [provider]  Lactobacillus Rhamnosus, GG, (CULTURELLE) CAPS Take by mouth.    [provider]  latanoprost (XALATAN) 0.005 % ophthalmic solution Place 1 drop into the left eye at bedtime. As directed 10/15/13   [provider]  polyethylene glycol (MIRALAX) packet Take 17 g by mouth daily. Patient not taking: Reported on 12/19/2020 06/08/16   Pixie Casino, MD  potassium chloride (K-DUR) 10 MEQ tablet TK 1 T PO D 05/06/18   [provider]    Inpatient Medications: Scheduled Meds: .  stroke: mapping our early stages of recovery book   Does not apply Once  .  aspirin EC  81 mg Oral q AM  . atorvastatin  20 mg Oral Daily  . clopidogrel  75 mg Oral Daily  . dexamethasone (DECADRON) injection  6 mg Intravenous Q24H  . donepezil  5 mg Oral QHS  . enoxaparin (LOVENOX) injection  40 mg Subcutaneous Q24H  . latanoprost  1 drop Left Eye QHS  . pantoprazole  40 mg Oral Daily   Continuous Infusions: . remdesivir 100 mg in NS 100 mL 100 mg (12/22/20 1001)   PRN Meds: albuterol  Allergies:   No Known Allergies  Social History:   Social History   Socioeconomic History  . Marital status: Married    Spouse name: Not on file  . Number of children: 2  . Years of education: Not on file  . Highest education level: Not on file  Occupational History    Comment: retired  Tobacco Use  . Smoking status: Never Smoker  . Smokeless tobacco: Never Used  Vaping Use  . Vaping Use: Never used  Substance and Sexual Activity  . Alcohol use: Not Currently  . Drug use: Never  . Sexual activity: Not Currently  Other Topics Concern  . Not on file  Social History Narrative   Second marriage. Has two grown children. One child resides in [place].    Social Determinants of Health   Financial Resource Strain: Not on file  Food Insecurity: Not on file  Transportation Needs: Not on file  Physical Activity: Not on file  Stress: Not on file  Social Connections: Not on file  Intimate Partner Violence: Not on file    Family History:    Family History  Problem Relation Age of Onset  . Heart disease Mother   . Hypertension Mother   . Breast cancer Sister   . Prostate cancer Neg Hx   . Colon cancer Neg Hx   . Pancreatic cancer Neg Hx      ROS:  Please see the history of present illness.  General:+ colds + hypoxia with COVID 19, no weight changes Skin:no rashes or ulcers HEENT:no blurred vision, no congestion CV:see HPI PUL:see HPI GI:no diarrhea constipation or melena, no indigestion GU:no hematuria, no dysuria MS:no joint pain, no  claudication Neuro:no syncope, no lightheadedness, was falling prior to admit his wife thought he tripped.  Also rt mouth droop trouble swallowing.  Endo:no diabetes, no thyroid disease  All other ROS reviewed and negative.     Physical Exam/Data:   Vitals:   12/22/20 0030 12/22/20 0500 12/22/20 0748 12/22/20 1222  BP: 139/84  136/78 (!) 117/101  Pulse: 77  79 77  Resp: 20  20 (!) 22  Temp: 98.1 F (36.7 C)  (!) 97.5 F (  36.4 C) (!) 97.4 F (36.3 C)  TempSrc: Axillary  Oral Oral  SpO2: 96%  94% 99%  Weight:  86.6 kg    Height:       No intake or output data in the 24 hours ending 12/22/20 1304 Last 3 Weights 12/22/2020 12/21/2020 01/27/2019  Weight (lbs) 190 lb 14.7 oz 191 lb 199 lb 12.8 oz  Weight (kg) 86.6 kg 86.637 kg 90.629 kg     Body mass index is 25.19 kg/m.   Gen Exam:Alert awake-not in any distress HEENT:atraumatic, normocephalic Chest: Decreased breath sounds; coarse breath sounds improved after cough CVS:  Normal S1, S2 there is a II/VI holodiastolic rumble with bounding radial pulses Abdomen:soft non tender, non distended Extremities:no edema Neurology: Able to move his right and left side against gravity, no right arm deficit Skin: no rash, skin is warm, no Janeway's lesions, no Osler's Nodes   Relevant CV Studies: TTE 12/21/20 IMPRESSIONS    1. Left ventricular ejection fraction, by estimation, is 50 to 55%. The  left ventricle has low normal function. Left ventricular diastolic  parameters are consistent with Grade I diastolic dysfunction (impaired  relaxation).  2. There is a small (1.1 x 0.4 cm) mobile density on the aortic valve  passing between the LVOT and aorta. It appears to be on the left coronary  cusp. Recommend TEE to better evaluate. The aortic valve is tricuspid.  Aortic valve regurgitation is  moderate.  3. There is normal pulmonary artery systolic pressure.  4. The inferior vena cava is normal in size with <50% respiratory   variability, suggesting right atrial pressure of 8 mmHg.  5. Agitated saline contrast bubble study was negative, with no evidence  of any interatrial shunt.   FINDINGS  Left Ventricle: Left ventricular ejection fraction, by estimation, is 50  to 55%. The left ventricle has low normal function. Left ventricular  diastolic parameters are consistent with Grade I diastolic dysfunction  (impaired relaxation). Normal left  ventricular filling pressure.   Right Ventricle: There is normal pulmonary artery systolic pressure. The  tricuspid regurgitant velocity is 1.95 m/s, and with an assumed right  atrial pressure of 8 mmHg, the estimated right ventricular systolic  pressure is 16.1 mmHg.   Tricuspid Valve: Tricuspid valve regurgitation is trivial.   Aortic Valve: There is a small (1.1 x 0.4 cm) mobile density on the aortic  valve passing between the LVOT and aorta. It appears to be on the left  coronary cusp. Recommend TEE to better evaluate. The aortic valve is  tricuspid. Aortic valve regurgitation  is moderate. Aortic regurgitation PHT measures 320 msec.   Pulmonic Valve: Pulmonic valve regurgitation is mild.   Venous: The inferior vena cava is normal in size with less than 50%  respiratory variability, suggesting right atrial pressure of 8 mmHg.   IAS/Shunts: Agitated saline contrast was given intravenously to evaluate  for intracardiac shunting. Agitated saline contrast bubble study was  negative, with no evidence of any interatrial shunt.   LEFT VENTRICLE  PLAX 2D  LVIDd:     5.10 cm Diastology  LVIDs:     3.80 cm LV e' medial:  6.74 cm/s  LV PW:     0.90 cm LV E/e' medial: 8.3  LV IVS:    1.00 cm  LVOT diam:   2.30 cm  LVOT Area:   4.15 cm     IVC  IVC diam: 1.80 cm   LEFT ATRIUM     Index  LA diam:  4.60 cm 2.18 cm/m  AORTIC VALVE  AI PHT:   320 msec    AORTA  Ao Asc diam: 3.60 cm   MITRAL VALVE        TRICUSPID  VALVE  MV Area (PHT): 2.83 cm  TR Peak grad:  15.2 mmHg  MV Decel Time: 268 msec  TR Vmax:    195.00 cm/s  MV E velocity: 55.70 cm/s  MV A velocity: 73.30 cm/s SHUNTS  MV E/A ratio: 0.76    Systemic Diam: 2.30 cm   Laboratory Data:  High Sensitivity Troponin:  No results for input(s): TROPONINIHS in the last 720 hours.   Chemistry Recent Labs  Lab 12/20/20 0317 12/20/20 0653 12/21/20 0050 12/22/20 0029  NA 142 145 142 142  K 3.7 4.2 3.8 3.5  CL 108  --  108 106  CO2 22  --  24 25  GLUCOSE 116*  --  109* 87  BUN 20  --  22 26*  CREATININE 0.91  --  0.87 1.12  CALCIUM 8.7*  --  8.5* 8.3*  GFRNONAA >60  --  >60 >60  ANIONGAP 12  --  10 11    Recent Labs  Lab 12/20/20 0317 12/21/20 0050 12/22/20 0029  PROT 6.3* 5.9* 5.8*  ALBUMIN 3.6 3.2* 3.2*  AST 41 42* 44*  ALT 22 21 24   ALKPHOS 98 87 93  BILITOT 1.0 1.0 1.0   Hematology Recent Labs  Lab 12/20/20 0317 12/20/20 0653 12/21/20 0050 12/22/20 0029  WBC 5.5  --  6.2 4.5  RBC 5.24  --  5.07 5.08  HGB 17.9* 16.7 17.6* 17.3*  HCT 52.0 49.0 49.6 49.6  MCV 99.2  --  97.8 97.6  MCH 34.2*  --  34.7* 34.1*  MCHC 34.4  --  35.5 34.9  RDW 13.4  --  13.5 13.7  PLT 119*  --  127* 128*   BNPNo results for input(s): BNP, PROBNP in the last 168 hours.  DDimer  Recent Labs  Lab 12/21/20 0050 12/22/20 0029  DDIMER 0.77* 1.02*     Radiology/Studies:  CT HEAD WO CONTRAST  Result Date: 12/19/2020 CLINICAL DATA:  Neuro deficit, right facial droop and dysphagia. Status post fall EXAM: CT HEAD WITHOUT CONTRAST TECHNIQUE: Contiguous axial images were obtained from the base of the skull through the vertex without intravenous contrast. COMPARISON:  None. FINDINGS: Brain: Cerebral ventricle sizes are concordant with the degree of cerebral volume loss. Patchy and confluent areas of decreased attenuation are noted throughout the deep and periventricular white matter of the cerebral hemispheres bilaterally, compatible  with chronic microvascular ischemic disease. No evidence of large-territorial acute infarction. No parenchymal hemorrhage. No mass lesion. No extra-axial collection. Large 9 cm left anterior temporal cystic lesion similar to cerebrospinal fluid. Associated mass effect on the surrounding cerebral cortex. Another cystic lesion is noted along the frontal horn of the left lateral ventricle (5:42). No midline shift. No hydrocephalus. Basilar cisterns are patent. Vascular: No hyperdense vessel. Atherosclerotic calcifications are present within the cavernous internal carotid arteries. Skull: No acute fracture or focal lesion. Sinuses/Orbits: Paranasal sinuses and mastoid air cells are clear. The orbits are unremarkable. Other: None. IMPRESSION: 1. No acute intracranial abnormality. 2. Large 9 cm left anterior temporal cystic lesion likely representing an arachnoid cyst. Associated chronic mass effect on the surrounding frontal and temporal lobe. Differential diagnosis includes epidermoid cyst. Comparison with prior cross-sectional imaging would be of value to evaluate stability. Electronically Signed   By: Iven Finn  M.D.   On: 12/19/2020 18:48   MR ANGIO HEAD WO CONTRAST  Result Date: 12/20/2020 CLINICAL DATA:  Initial evaluation for acute TIA, right-sided facial droop, falls. EXAM: MRI HEAD WITHOUT CONTRAST MRA HEAD WITHOUT CONTRAST MRA NECK WITHOUT CONTRAST TECHNIQUE: Multiplanar, multiecho pulse sequences of the brain and surrounding structures were obtained without intravenous contrast. Angiographic images of the Circle of Willis were obtained using MRA technique without intravenous contrast. Angiographic images of the neck were obtained using MRA technique without intravenous contrast. Carotid stenosis measurements (when applicable) are obtained utilizing NASCET criteria, using the distal internal carotid diameter as the denominator. COMPARISON:  Prior CT from 12/19/2020. FINDINGS: MRI HEAD FINDINGS Brain:  Diffuse prominence of the CSF containing spaces compatible with generalized age-related cerebral atrophy. Patchy T2/FLAIR hyperintensity within the periventricular and deep white matter both cerebral hemispheres consistent with chronic small vessel ischemic disease, mild in nature. Patchy involvement of the pons noted. Superimposed remote hemorrhagic lacunar infarct present at the left basal ganglia. Few additional small remote left basal ganglia and left thalamic lacunar infarcts noted. 9 mm focus of mild diffusion signal abnormality seen involving the subcortical and deep white matter of the posterior left frontal centrum semi ovale (series 5, image 94). No discernible associated ADC correlate. Finding likely reflects a small focus of subacute small vessel ischemia. No associated hemorrhage or mass effect. No other diffusion abnormality to suggest acute or subacute ischemia. Gray-white matter differentiation maintained. No other areas of remote cortical infarction. No other foci of susceptibility artifact to suggest acute or chronic intracranial hemorrhage. Well-circumscribed cystic lesion measuring 7.3 x 5.0 x 6.9 cm seen at the left frontotemporal region. This follows CSF signal intensity on all pulse sequences, consistent with a benign arachnoid cyst. Associated mild regional mass effect with trace 2-3 mm of left-to-right shift. No hydrocephalus or ventricular trapping. No other mass lesion or mass effect. No extra-axial fluid collection. Pituitary gland and suprasellar region normal. Midline structures intact. Vascular: Major intracranial vascular flow voids are maintained. Skull and upper cervical spine: Craniocervical junction within normal limits. Bone marrow signal intensity normal. No scalp soft tissue abnormality. Sinuses/Orbits: Globes and orbital soft tissues within normal limits. Scattered mucosal thickening noted within the ethmoidal air cells and maxillary sinuses. Paranasal sinuses are otherwise  clear. Trace fluid signal intensity noted within the mastoid air cells bilaterally, of doubtful significance. Inner ear structures grossly within normal limits. Other: None. MRA HEAD FINDINGS ANTERIOR CIRCULATION: Visualized distal cervical segments of the internal carotid arteries are widely patent with antegrade flow. Petrous, cavernous, and supraclinoid segments widely patent without stenosis or other abnormality. A1 segments patent bilaterally. Normal anterior communicating artery complex. Anterior cerebral arteries widely patent to their distal aspects. No M1 stenosis or occlusion. Normal MCA bifurcations. Distal MCA branches well perfused bilaterally. Mass effect with superior displacement of the left MCA by the large left-sided arachnoid cyst noted. POSTERIOR CIRCULATION: Both V4 segments widely patent to the vertebrobasilar junction. Both PICA origins patent and normal. Basilar widely patent to its distal aspect. Superior cerebellar arteries patent bilaterally. Both PCAs supplied via the basilar well perfused to their distal aspects. No intracranial aneurysm. MRA NECK FINDINGS AORTIC ARCH: Examination technically limited by lack of IV contrast. Visualized aortic arch of normal caliber with normal 3 vessel morphology. No hemodynamically significant stenosis seen about the origin of the great vessels. RIGHT CAROTID SYSTEM: Right CCA patent from its origin to the bifurcation without stenosis. Mild for age atheromatous irregularity about the right bifurcation/proximal right ICA without hemodynamically  significant stenosis. Right ICA widely patent distally without stenosis, evidence for dissection, or occlusion. LEFT CAROTID SYSTEM: Left CCA patent from its origin to the bifurcation without stenosis. Mild for age atheromatous irregularity about the left bifurcation/proximal left ICA without significant stenosis. Left ICA patent distally without stenosis, evidence for dissection, or occlusion. VERTEBRAL ARTERIES:  Both vertebral arteries appear to arise from the subclavian arteries. No visible proximal subclavian artery stenosis. Proximal left V1 segment not well seen due to tortuosity and lack of IV contrast. Visualized portions of the vertebral arteries are patent within the neck without stenosis, evidence for dissection or occlusion. IMPRESSION: MRI HEAD IMPRESSION: 1. 9 mm focus of mild diffusion abnormality involving the subcortical/deep white matter of the posterior left frontal centrum semi ovale, most likely reflecting a small focus of subacute small vessel ischemia. No associated hemorrhage or mass effect. 2. No other acute intracranial abnormality. 3. 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 4. 7.3 x 5.0 x 6.9 cm benign arachnoid cyst at the left frontotemporal region with associated regional mass effect and trace 2-3 mm of left-to-right shift. MRA HEAD IMPRESSION: Negative intracranial MRA. No large vessel occlusion. No hemodynamically significant or correctable stenosis. MRA NECK IMPRESSION: Negative MRA of the neck. No hemodynamically significant or critical flow limiting stenosis within the neck. Electronically Signed   By: Jeannine Boga M.D.   On: 12/20/2020 03:35   MR ANGIO NECK WO CONTRAST  Result Date: 12/20/2020 CLINICAL DATA:  Initial evaluation for acute TIA, right-sided facial droop, falls. EXAM: MRI HEAD WITHOUT CONTRAST MRA HEAD WITHOUT CONTRAST MRA NECK WITHOUT CONTRAST TECHNIQUE: Multiplanar, multiecho pulse sequences of the brain and surrounding structures were obtained without intravenous contrast. Angiographic images of the Circle of Willis were obtained using MRA technique without intravenous contrast. Angiographic images of the neck were obtained using MRA technique without intravenous contrast. Carotid stenosis measurements (when applicable) are obtained  utilizing NASCET criteria, using the distal internal carotid diameter as the denominator. COMPARISON:  Prior CT from 12/19/2020. FINDINGS: MRI HEAD FINDINGS Brain: Diffuse prominence of the CSF containing spaces compatible with generalized age-related cerebral atrophy. Patchy T2/FLAIR hyperintensity within the periventricular and deep white matter both cerebral hemispheres consistent with chronic small vessel ischemic disease, mild in nature. Patchy involvement of the pons noted. Superimposed remote hemorrhagic lacunar infarct present at the left basal ganglia. Few additional small remote left basal ganglia and left thalamic lacunar infarcts noted. 9 mm focus of mild diffusion signal abnormality seen involving the subcortical and deep white matter of the posterior left frontal centrum semi ovale (series 5, image 94). No discernible associated ADC correlate. Finding likely reflects a small focus of subacute small vessel ischemia. No associated hemorrhage or mass effect. No other diffusion abnormality to suggest acute or subacute ischemia. Gray-white matter differentiation maintained. No other areas of remote cortical infarction. No other foci of susceptibility artifact to suggest acute or chronic intracranial hemorrhage. Well-circumscribed cystic lesion measuring 7.3 x 5.0 x 6.9 cm seen at the left frontotemporal region. This follows CSF signal intensity on all pulse sequences, consistent with a benign arachnoid cyst. Associated mild regional mass effect with trace 2-3 mm of left-to-right shift. No hydrocephalus or ventricular trapping. No other mass lesion or mass effect. No extra-axial fluid collection. Pituitary gland and suprasellar region normal. Midline structures intact. Vascular: Major intracranial vascular flow voids are maintained. Skull and upper cervical spine: Craniocervical junction within normal limits.  Bone marrow signal intensity normal. No scalp soft tissue abnormality. Sinuses/Orbits: Globes and  orbital soft tissues within normal limits. Scattered mucosal thickening noted within the ethmoidal air cells and maxillary sinuses. Paranasal sinuses are otherwise clear. Trace fluid signal intensity noted within the mastoid air cells bilaterally, of doubtful significance. Inner ear structures grossly within normal limits. Other: None. MRA HEAD FINDINGS ANTERIOR CIRCULATION: Visualized distal cervical segments of the internal carotid arteries are widely patent with antegrade flow. Petrous, cavernous, and supraclinoid segments widely patent without stenosis or other abnormality. A1 segments patent bilaterally. Normal anterior communicating artery complex. Anterior cerebral arteries widely patent to their distal aspects. No M1 stenosis or occlusion. Normal MCA bifurcations. Distal MCA branches well perfused bilaterally. Mass effect with superior displacement of the left MCA by the large left-sided arachnoid cyst noted. POSTERIOR CIRCULATION: Both V4 segments widely patent to the vertebrobasilar junction. Both PICA origins patent and normal. Basilar widely patent to its distal aspect. Superior cerebellar arteries patent bilaterally. Both PCAs supplied via the basilar well perfused to their distal aspects. No intracranial aneurysm. MRA NECK FINDINGS AORTIC ARCH: Examination technically limited by lack of IV contrast. Visualized aortic arch of normal caliber with normal 3 vessel morphology. No hemodynamically significant stenosis seen about the origin of the great vessels. RIGHT CAROTID SYSTEM: Right CCA patent from its origin to the bifurcation without stenosis. Mild for age atheromatous irregularity about the right bifurcation/proximal right ICA without hemodynamically significant stenosis. Right ICA widely patent distally without stenosis, evidence for dissection, or occlusion. LEFT CAROTID SYSTEM: Left CCA patent from its origin to the bifurcation without stenosis. Mild for age atheromatous irregularity about the  left bifurcation/proximal left ICA without significant stenosis. Left ICA patent distally without stenosis, evidence for dissection, or occlusion. VERTEBRAL ARTERIES: Both vertebral arteries appear to arise from the subclavian arteries. No visible proximal subclavian artery stenosis. Proximal left V1 segment not well seen due to tortuosity and lack of IV contrast. Visualized portions of the vertebral arteries are patent within the neck without stenosis, evidence for dissection or occlusion. IMPRESSION: MRI HEAD IMPRESSION: 1. 9 mm focus of mild diffusion abnormality involving the subcortical/deep white matter of the posterior left frontal centrum semi ovale, most likely reflecting a small focus of subacute small vessel ischemia. No associated hemorrhage or mass effect. 2. No other acute intracranial abnormality. 3. 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 4. 7.3 x 5.0 x 6.9 cm benign arachnoid cyst at the left frontotemporal region with associated regional mass effect and trace 2-3 mm of left-to-right shift. MRA HEAD IMPRESSION: Negative intracranial MRA. No large vessel occlusion. No hemodynamically significant or correctable stenosis. MRA NECK IMPRESSION: Negative MRA of the neck. No hemodynamically significant or critical flow limiting stenosis within the neck. Electronically Signed   By: Jeannine Boga M.D.   On: 12/20/2020 03:35   MR BRAIN WO CONTRAST  Result Date: 12/20/2020 CLINICAL DATA:  Initial evaluation for acute TIA, right-sided facial droop, falls. EXAM: MRI HEAD WITHOUT CONTRAST MRA HEAD WITHOUT CONTRAST MRA NECK WITHOUT CONTRAST TECHNIQUE: Multiplanar, multiecho pulse sequences of the brain and surrounding structures were obtained without intravenous contrast. Angiographic images of the Circle of Willis were obtained using MRA technique without intravenous  contrast. Angiographic images of the neck were obtained using MRA technique without intravenous contrast. Carotid stenosis measurements (when applicable) are obtained utilizing NASCET criteria, using the distal internal carotid diameter as the denominator. COMPARISON:  Prior CT from 12/19/2020. FINDINGS: MRI HEAD FINDINGS Brain: Diffuse prominence of the CSF containing spaces compatible with generalized age-related cerebral atrophy. Patchy T2/FLAIR hyperintensity within the periventricular and deep white matter both cerebral hemispheres consistent with chronic small vessel ischemic disease, mild in nature. Patchy involvement of the pons noted. Superimposed remote hemorrhagic lacunar infarct present at the left basal ganglia. Few additional small remote left basal ganglia and left thalamic lacunar infarcts noted. 9 mm focus of mild diffusion signal abnormality seen involving the subcortical and deep white matter of the posterior left frontal centrum semi ovale (series 5, image 94). No discernible associated ADC correlate. Finding likely reflects a small focus of subacute small vessel ischemia. No associated hemorrhage or mass effect. No other diffusion abnormality to suggest acute or subacute ischemia. Gray-white matter differentiation maintained. No other areas of remote cortical infarction. No other foci of susceptibility artifact to suggest acute or chronic intracranial hemorrhage. Well-circumscribed cystic lesion measuring 7.3 x 5.0 x 6.9 cm seen at the left frontotemporal region. This follows CSF signal intensity on all pulse sequences, consistent with a benign arachnoid cyst. Associated mild regional mass effect with trace 2-3 mm of left-to-right shift. No hydrocephalus or ventricular trapping. No other mass lesion or mass effect. No extra-axial fluid collection. Pituitary gland and suprasellar region normal. Midline structures intact. Vascular: Major intracranial vascular flow voids are maintained. Skull and  upper cervical spine: Craniocervical junction within normal limits. Bone marrow signal intensity normal. No scalp soft tissue abnormality. Sinuses/Orbits: Globes and orbital soft tissues within normal limits. Scattered mucosal thickening noted within the ethmoidal air cells and maxillary sinuses. Paranasal sinuses are otherwise clear. Trace fluid signal intensity noted within the mastoid air cells bilaterally, of doubtful significance. Inner ear structures grossly within normal limits. Other: None. MRA HEAD FINDINGS ANTERIOR CIRCULATION: Visualized distal cervical segments of the internal carotid arteries are widely patent with antegrade flow. Petrous, cavernous, and supraclinoid segments widely patent without stenosis or other abnormality. A1 segments patent bilaterally. Normal anterior communicating artery complex. Anterior cerebral arteries widely patent to their distal aspects. No M1 stenosis or occlusion. Normal MCA bifurcations. Distal MCA branches well perfused bilaterally. Mass effect with superior displacement of the left MCA by the large left-sided arachnoid cyst noted. POSTERIOR CIRCULATION: Both V4 segments widely patent to the vertebrobasilar junction. Both PICA origins patent and normal. Basilar widely patent to its distal aspect. Superior cerebellar arteries patent bilaterally. Both PCAs supplied via the basilar well perfused to their distal aspects. No intracranial aneurysm. MRA NECK FINDINGS AORTIC ARCH: Examination technically limited by lack of IV contrast. Visualized aortic arch of normal caliber with normal 3 vessel morphology. No hemodynamically significant stenosis seen about the origin of the great vessels. RIGHT CAROTID SYSTEM: Right CCA patent from its origin to the bifurcation without stenosis. Mild for age atheromatous irregularity about the right bifurcation/proximal right ICA without hemodynamically significant stenosis. Right ICA widely patent distally without stenosis, evidence for  dissection, or occlusion. LEFT CAROTID SYSTEM: Left CCA patent from its origin to the bifurcation without stenosis. Mild for age atheromatous irregularity about the left bifurcation/proximal left ICA without significant stenosis. Left ICA patent distally without stenosis, evidence for dissection, or occlusion. VERTEBRAL ARTERIES: Both vertebral arteries appear to arise from the subclavian arteries. No visible proximal subclavian artery stenosis. Proximal left V1 segment not well seen due to tortuosity and lack of IV contrast. Visualized portions of the vertebral arteries are patent within the neck without stenosis, evidence for dissection or occlusion. IMPRESSION: MRI HEAD  IMPRESSION: 1. 9 mm focus of mild diffusion abnormality involving the subcortical/deep white matter of the posterior left frontal centrum semi ovale, most likely reflecting a small focus of subacute small vessel ischemia. No associated hemorrhage or mass effect. 2. No other acute intracranial abnormality. 3. 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 4. 7.3 x 5.0 x 6.9 cm benign arachnoid cyst at the left frontotemporal region with associated regional mass effect and trace 2-3 mm of left-to-right shift. MRA HEAD IMPRESSION: Negative intracranial MRA. No large vessel occlusion. No hemodynamically significant or correctable stenosis. MRA NECK IMPRESSION: Negative MRA of the neck. No hemodynamically significant or critical flow limiting stenosis within the neck. Electronically Signed   By: Jeannine Boga M.D.   On: 12/20/2020 03:35   DG Chest Portable 1 View  Result Date: 12/19/2020 CLINICAL DATA:  Initial evaluation for acute dysphagia. EXAM: PORTABLE CHEST 1 VIEW COMPARISON:  Prior radiograph from 11/02/2016. FINDINGS: Exaggeration of the cardiac silhouette related to AP technique and shallow lung inflation.  Mediastinal silhouette normal. Tortuosity the intrathoracic aorta noted. Slight left-to-right deviation of the tracheal air column at the level of the aortic knob, similar to previous. No appreciable esophageal dilatation. Lungs are hypoinflated. Streaky and linear opacities at the right greater than left lung bases felt to be most consistent with atelectasis. No other focal infiltrates. No pulmonary edema or visible pleural effusion. Left costophrenic angle incompletely visualized. No pneumothorax. No acute osseous finding.  Osteopenia noted. IMPRESSION: 1. Shallow lung inflation with associated bibasilar atelectasis, right greater than left. 2. No other active cardiopulmonary disease. Electronically Signed   By: Jeannine Boga M.D.   On: 12/19/2020 19:39   ECHOCARDIOGRAM LIMITED BUBBLE STUDY  Result Date: 12/21/2020    ECHOCARDIOGRAM LIMITED REPORT   Patient Name:   Wayne Walls Date of Exam: 12/21/2020 Medical Rec #:  [id number]       Height:       73.0 in Accession #:    [id number]      Weight:       191.0 lb Date of Birth:  08-17-1933        BSA:          2.110 m Patient Age:    17 years        BP:           112/66 mmHg Patient Gender: M               HR:           65 bpm. Exam Location:  Inpatient Procedure: Limited Echo, Limited Color Doppler and Cardiac Doppler Indications:    stroke 434.91  History:        Patient has no prior history of Echocardiogram examinations.                 Covid; Risk Factors:Sleep Apnea.  Sonographer:    Johny Chess Referring Phys: [id number] JUSTIN B HOWERTER  Sonographer Comments: Image acquisition challenging due to uncooperative patient and Image acquisition challenging due to respiratory motion. IMPRESSIONS  1. Left ventricular ejection fraction, by estimation, is 50 to 55%. The left ventricle has low normal function. Left ventricular diastolic parameters are consistent with Grade I diastolic dysfunction (impaired relaxation).  2. There is a small (1.1 x 0.4 cm)  mobile density on the aortic valve passing between the LVOT and aorta. It appears to be on the left  coronary cusp. Recommend TEE to better evaluate. The aortic valve is tricuspid. Aortic valve regurgitation is moderate.  3. There is normal pulmonary artery systolic pressure.  4. The inferior vena cava is normal in size with <50% respiratory variability, suggesting right atrial pressure of 8 mmHg.  5. Agitated saline contrast bubble study was negative, with no evidence of any interatrial shunt. FINDINGS  Left Ventricle: Left ventricular ejection fraction, by estimation, is 50 to 55%. The left ventricle has low normal function. Left ventricular diastolic parameters are consistent with Grade I diastolic dysfunction (impaired relaxation). Normal left ventricular filling pressure. Right Ventricle: There is normal pulmonary artery systolic pressure. The tricuspid regurgitant velocity is 1.95 m/s, and with an assumed right atrial pressure of 8 mmHg, the estimated right ventricular systolic pressure is 123XX123 mmHg. Tricuspid Valve: Tricuspid valve regurgitation is trivial. Aortic Valve: There is a small (1.1 x 0.4 cm) mobile density on the aortic valve passing between the LVOT and aorta. It appears to be on the left coronary cusp. Recommend TEE to better evaluate. The aortic valve is tricuspid. Aortic valve regurgitation is moderate. Aortic regurgitation PHT measures 320 msec. Pulmonic Valve: Pulmonic valve regurgitation is mild. Venous: The inferior vena cava is normal in size with less than 50% respiratory variability, suggesting right atrial pressure of 8 mmHg. IAS/Shunts: Agitated saline contrast was given intravenously to evaluate for intracardiac shunting. Agitated saline contrast bubble study was negative, with no evidence of any interatrial shunt. LEFT VENTRICLE PLAX 2D LVIDd:         5.10 cm  Diastology LVIDs:         3.80 cm  LV e' medial:   6.74 cm/s LV PW:         0.90 cm  LV E/e' medial: 8.3 LV IVS:        1.00  cm LVOT diam:     2.30 cm LVOT Area:     4.15 cm  IVC IVC diam: 1.80 cm LEFT ATRIUM         Index LA diam:    4.60 cm 2.18 cm/m  AORTIC VALVE AI PHT:      320 msec  AORTA Ao Asc diam: 3.60 cm MITRAL VALVE               TRICUSPID VALVE MV Area (PHT): 2.83 cm    TR Peak grad:   15.2 mmHg MV Decel Time: 268 msec    TR Vmax:        195.00 cm/s MV E velocity: 55.70 cm/s MV A velocity: 73.30 cm/s  SHUNTS MV E/A ratio:  0.76        Systemic Diam: 2.30 cm Skeet Latch MD Electronically signed by Skeet Latch MD Signature Date/Time: 12/21/2020/6:09:09 PM    Final      Assessment and Plan:   1. Abnormal Echo with EF 50-55% and G1DD but small mobile density on the aortic valve passing between the LVOT and aorta. It appears to be on the left coronary cusp. Recommend TEE to better evaluate. The aortic valve is tricuspid. Aortic valve regurgitation is moderate.  Negative bubble study.   Once pt off isolation would consider TEE.   No hx of CAD neg nuc study 2014.  2. Acute CVA now on ASA and plavix. And statin.  bubble study negative 3. Acute hypoxic respiratory failure due to COVID-19 infection. + vaccine but no booster - per IM on 3 L [place], on IV decadron and remdesivir.  Treating inflammatory markers. 4. Hx  of dementia per IM 5. HLD LDL of 114 now with CVA on lipitor 20 mg 6. Glaucoma on eye drops.    For questions or updates, please contact [place] Please consult [email or web address] for contact info under    Signed, Cecilie Kicks, NP  12/22/2020 1:04 PM  Personally seen and examined. Agree with APP above with the following comments: Briefly 87 yo M with no prior echos with small focuse of subacute small vessel ischemia without evidence of large embolic disease Patient notes no fevers, and that he feels well.  Wants to make sure his heart is ok Exam notable for Diastolic murmur and bounding radial pulse consistent with moderate aortic regurgitation Labs notable for normal  procalcitonin Personally reviewed relevant tests:  There is moderate aortic regurgitation with a small calcific echodensity on the ventricular side of the aortic valve. Presently the Duke's Criteria for Infective endocarditis is low, without significant fever (despite COVID-19 positive), presidopsing condition, IV drug ives, vascular phenomenon (unless we suggest that this small vessel disease in actually embolic, not suggested by Dr. Clydene Fake analysis), no immunologic or vascular evidence on exam. Discussed with primary MD and patient's wife:  If there is concern for infective endocarditis (new fevers, positive blood cultures, etc.) we may consider more aggressive TEE.  Otherwise, in the setting of COVID-19, would re-discussed this in outpatient follow up after recovery, as if this is a papillary fibroelastoma, the treatment (surgical excision) may not be appropriate in this patient with his comorbidity.  Dr. Sloan Leiter and patient's wife amenable to this plan.  [facility] will sign off.   Medication Recommendations:  No new changes (agree with neuro recs) Other recommendations (labs, testing, etc):  Reasonable for re-consult if new evidence of infective endocarditis Follow up as an outpatient:  We are currently arranging outpatient follow up post COVID-19 Isolation with cardiology.  Werner Lean, MD

## 2020-12-22 NOTE — TOC Progression Note (Deleted)
Transition of Care [facility]) - Progression Note    Patient Details  Name: Wayne Walls MRN: [id number] Date of Birth: 1933-03-02  Transition of [facility]) CM/SW Contact  Joanne Chars, LCSW Phone Number: 12/22/2020, 2:10 PM  Clinical Narrative:   Pt accepted by [facility] for PT/OT.      Expected Discharge Plan: [place] Barriers to Discharge: Continued Medical Work up  Expected Discharge Plan and Services Expected Discharge Plan: [place]   Discharge Planning Services: CM Consult Post Acute Care Choice: [place] arrangements for the past 2 months: [place]: [place]: [place] ([place]) Date [place]: 12/22/20 Time [place]: 1410 Representative spoke with at [place]: [place] ([place]) Interventions    Readmission Risk Interventions No flowsheet data found.

## 2020-12-23 LAB — COMPREHENSIVE METABOLIC PANEL
ALT: 39 U/L (ref 0–44)
AST: 51 U/L — ABNORMAL HIGH (ref 15–41)
Albumin: 3 g/dL — ABNORMAL LOW (ref 3.5–5.0)
Alkaline Phosphatase: 93 U/L (ref 38–126)
Anion gap: 11 (ref 5–15)
BUN: 24 mg/dL — ABNORMAL HIGH (ref 8–23)
CO2: 21 mmol/L — ABNORMAL LOW (ref 22–32)
Calcium: 8.1 mg/dL — ABNORMAL LOW (ref 8.9–10.3)
Chloride: 106 mmol/L (ref 98–111)
Creatinine, Ser: 0.9 mg/dL (ref 0.61–1.24)
GFR, Estimated: >60 mL/min (ref >60)
Glucose, Bld: 130 mg/dL — ABNORMAL HIGH (ref 70–99)
Potassium: 3.6 mmol/L (ref 3.5–5.1)
Sodium: 138 mmol/L (ref 135–145)
Total Bilirubin: 0.8 mg/dL (ref 0.3–1.2)
Total Protein: 5.5 g/dL — ABNORMAL LOW (ref 6.5–8.1)

## 2020-12-23 LAB — CBC
HCT: 45 % (ref 39.0–52.0)
Hemoglobin: 16 g/dL (ref 13.0–17.0)
MCH: 34.3 pg — ABNORMAL HIGH (ref 26.0–34.0)
MCHC: 35.6 g/dL (ref 30.0–36.0)
MCV: 96.6 fL (ref 80.0–100.0)
Platelets: 125 10*3/uL — ABNORMAL LOW (ref 150–400)
RBC: 4.66 MIL/uL (ref 4.22–5.81)
RDW: 13.4 % (ref 11.5–15.5)
WBC: 5.5 10*3/uL (ref 4.0–10.5)
nRBC: 0 % (ref 0.0–0.2)

## 2020-12-23 LAB — D-DIMER, QUANTITATIVE: D-Dimer, Quant: 0.41 ug/mL-FEU (ref 0.00–0.50)

## 2020-12-23 LAB — C-REACTIVE PROTEIN: CRP: 1 mg/dL — ABNORMAL HIGH (ref <1.0)

## 2020-12-23 MED ORDER — ASPIRIN 81 MG PO TBEC: 81.0000 mg | DELAYED_RELEASE_TABLET | Freq: Every morning | ORAL | 0 refills | Status: AC

## 2020-12-23 MED ORDER — CLOPIDOGREL BISULFATE 75 MG PO TABS: 75.0000 mg | ORAL_TABLET | Freq: Every day | ORAL | 0 refills | Status: DC

## 2020-12-23 MED ORDER — ALBUTEROL SULFATE HFA 108 (90 BASE) MCG/ACT IN AERS: 1.0000 | INHALATION_SPRAY | RESPIRATORY_TRACT | 0 refills | Status: DC | PRN

## 2020-12-23 MED ORDER — ATORVASTATIN CALCIUM 20 MG PO TABS: 20.0000 mg | ORAL_TABLET | Freq: Every day | ORAL | 0 refills | Status: DC

## 2020-12-23 MED ORDER — PANTOPRAZOLE SODIUM 40 MG PO TBEC: 40.0000 mg | DELAYED_RELEASE_TABLET | Freq: Every day | ORAL | 0 refills | Status: DC

## 2020-12-23 NOTE — Care Management (Signed)
[facility] called, wife called to give a rough stimate of ETA

## 2020-12-23 NOTE — Discharge Instructions (Signed)
https://point-[email or web address]-care.elsevierperformancemanager.com/[email or web address]">  Dementia Caregiver Guide Dementia is a term used to describe a number of symptoms that affect memory and thinking. The most common symptoms include:  Memory loss.  Trouble with language and communication.  Trouble concentrating.  Poor judgment and problems with reasoning.  Wandering from home or public places.  Extreme anxiety or depression.  Being suspicious or having angry outbursts and accusations.  Child-like behavior and language. Dementia can be frightening and confusing. And taking care of someone with dementia can be challenging. This guide provides tips to help you when providing care for a person with dementia. How to help manage lifestyle changes Dementia usually gets worse slowly over time. In the early stages, people with dementia can stay independent and safe with some help. In later stages, they need help with daily tasks such as dressing, grooming, and using the bathroom. There are actions you can take to help a person manage his or her life while living with this condition. Communicating  When the person is talking or seems frustrated, make eye contact and hold the person's hand.  Ask specific questions that need yes or no answers.  Use simple words, short sentences, and a calm voice. Only give one direction at a time.  When offering choices, limit the person to just one or two.  Avoid correcting the person in a negative way.  If the person is struggling to find the right words, gently try to help him or her. Preventing injury  Keep floors clear of clutter. Remove rugs, magazine racks, and floor lamps.  Keep hallways well lit, especially at night.  Put a handrail and nonslip mat in the bathtub or shower.  Put childproof locks on cabinets that contain dangerous items, such as medicines, alcohol, guns, toxic cleaning items, sharp tools or utensils, matches, and lighters.  For doors to the  outside of the house, put the locks in places where the person cannot see or reach them easily. This will help ensure that the person does not wander out of the house and get lost.  Be prepared for emergencies. Keep a list of emergency phone numbers and addresses in a convenient area.  Remove car keys and lock garage doors so that the person does not try to get in the car and drive.  Have the person wear a bracelet that tracks locations and identifies the person as having memory problems. This should be worn at all times for safety.   Helping with daily life  Keep the person on track with his or her routine.  Try to identify areas where the person may need help.  Be supportive, patient, calm, and encouraging.  Gently remind the person that adjusting to changes takes time.  Help with the tasks that the person has asked for help with.  Keep the person involved in daily tasks and decisions as much as possible.  Encourage conversation, but try not to get frustrated if the person struggles to find words or does not seem to appreciate your help.   How to recognize stress Look for signs of stress in yourself and in the person you are caring for. If you notice signs of stress, take steps to manage it. Symptoms of stress include:  Feeling anxious, irritable, frustrated, or angry.  Denying that the person has dementia or that his or her symptoms will not improve.  Feeling depressed, hopeless, or unappreciated.  Difficulty sleeping.  Difficulty concentrating.  Developing stress-related health problems.  Feeling like you have too little time  for your own life. Follow these instructions at home: Take care of your health Make sure that you and the person you are caring for:  Get regular sleep.  Exercise regularly.  Eat regular, nutritious meals.  Take over-the-counter and prescription medicines only as told by your health care providers.  Drink enough fluid to keep your urine pale  yellow.  Attend all scheduled health care appointments.   General instructions  Join a support group with others who are caregivers.  Ask about respite care resources. Respite care can provide short-term care for the person so that you can have a regular break from the stress of caregiving.  Consider any safety risks and take steps to avoid them.  Organize medicines in a pill box for each day of the week.  Create a plan to handle any legal or financial matters. Get legal or financial advice if needed.  Keep a calendar in a central location to remind the person of appointments or other activities. Where to find support: Many individuals and organizations offer support. These include:  Support groups for people with dementia.  Support groups for caregivers.  Counselors or therapists.  Home health care services.  Adult day care centers. Where to find more information  Centers for Disease Control and Prevention: http://www.wolf.info/  Alzheimer's Association: CapitalMile.co.nz  Family Caregiver Alliance: www.caregiver.[place]: www.alzfdn.org Contact a health care provider if:  The person's health is rapidly getting worse.  You are no longer able to care for the person.  Caring for the person is affecting your physical and emotional health.  You are feeling depressed or anxious about caring for the person. Get help right away if:  The person threatens himself or herself, you, or anyone else.  You feel depressed or sad, or feel that you want to harm yourself. If you ever feel like your loved one may hurt himself or herself or others, or if he or she shares thoughts about taking his or her own life, get help right away. You can go to your nearest emergency department or:  Call your local emergency services (911 in the [place]  Call a suicide crisis helpline, such as the [place] at (872) 786-4611. This is open 24 hours a day in  the [place]  Text the Crisis Text Line at 920-546-6875 (in the [place].). Summary  Dementia is a term used to describe a number of symptoms that affect memory and thinking.  Dementia usually gets worse slowly over time.  Take steps to reduce the person's risk of injury and to plan for future care.  Caregivers need support, relief from caregiving, and time for their own lives. This information is not intended to replace advice given to you by your health care provider. Make sure you discuss any questions you have with your health care provider. Document Revised: 04/04/2020 Document Reviewed: 04/04/2020 Elsevier Patient Education  2021 [place] Under Monitoring Name: Wayne Walls  Location: [place] [address]   Infection Prevention Recommendations for Individuals Confirmed to have, or Being Evaluated for, 2019 Novel Coronavirus (COVID-19) Infection Who Receive Care at Home  Individuals who are confirmed to have, or are being evaluated for, COVID-19 should follow the prevention steps below until a healthcare provider or local or state health department says they can return to normal activities.  Stay home except to get medical care You should restrict activities outside your home, except for  getting medical care. Do not go to work, school, or public areas, and do not use public transportation or taxis.  Call ahead before visiting your doctor Before your medical appointment, call the healthcare provider and tell them that you have, or are being evaluated for, COVID-19 infection. This will help the healthcare providers office take steps to keep other people from getting infected. Ask your healthcare provider to call the local or state health department.  Monitor your symptoms Seek prompt medical attention if your illness is worsening (e.g., difficulty breathing). Before going to your medical appointment, call the healthcare provider and tell  them that you have, or are being evaluated for, COVID-19 infection. Ask your healthcare provider to call the local or state health department.  Wear a facemask You should wear a facemask that covers your nose and mouth when you are in the same room with other people and when you visit a healthcare provider. People who live with or visit you should also wear a facemask while they are in the same room with you.  Separate yourself from other people in your home As much as possible, you should stay in a different room from other people in your home. Also, you should use a separate bathroom, if available.  Avoid sharing household items You should not share dishes, drinking glasses, cups, eating utensils, towels, bedding, or other items with other people in your home. After using these items, you should wash them thoroughly with soap and water.  Cover your coughs and sneezes Cover your mouth and nose with a tissue when you cough or sneeze, or you can cough or sneeze into your sleeve. Throw used tissues in a lined trash can, and immediately wash your hands with soap and water for at least 20 seconds or use an alcohol-based hand rub.  Wash your [facility] your hands often and thoroughly with soap and water for at least 20 seconds. You can use an alcohol-based hand sanitizer if soap and water are not available and if your hands are not visibly dirty. Avoid touching your eyes, nose, and mouth with unwashed hands.   Prevention Steps for Caregivers and Household Members of Individuals Confirmed to have, or Being Evaluated for, COVID-19 Infection Being Cared for in the Home  If you live with, or provide care at home for, a person confirmed to have, or being evaluated for, COVID-19 infection please follow these guidelines to prevent infection:  Follow healthcare providers instructions Make sure that you understand and can help the patient follow any healthcare provider instructions for all  care.  Provide for the patients basic needs You should help the patient with basic needs in the home and provide support for getting groceries, prescriptions, and other personal needs.  Monitor the patients symptoms If they are getting sicker, call his or her medical provider and tell them that the patient has, or is being evaluated for, COVID-19 infection. This will help the healthcare providers office take steps to keep other people from getting infected. Ask the healthcare provider to call the local or state health department.  Limit the number of people who have contact with the patient  If possible, have only one caregiver for the patient.  Other household members should stay in another home or place of residence. If this is not possible, they should stay  in another room, or be separated from the patient as much as possible. Use a separate bathroom, if available.  Restrict visitors who do not have an essential  need to be in the home.  Keep older adults, very young children, and other sick people away from the patient Keep older adults, very young children, and those who have compromised immune systems or chronic health conditions away from the patient. This includes people with chronic heart, lung, or kidney conditions, diabetes, and cancer.  Ensure good ventilation Make sure that shared spaces in the home have good air flow, such as from an air conditioner or an opened window, weather permitting.  Wash your hands often  Wash your hands often and thoroughly with soap and water for at least 20 seconds. You can use an alcohol based hand sanitizer if soap and water are not available and if your hands are not visibly dirty.  Avoid touching your eyes, nose, and mouth with unwashed hands.  Use disposable paper towels to dry your hands. If not available, use dedicated cloth towels and replace them when they become wet.  Wear a facemask and gloves  Wear a disposable facemask at  all times in the room and gloves when you touch or have contact with the patients blood, body fluids, and/or secretions or excretions, such as sweat, saliva, sputum, nasal mucus, vomit, urine, or feces.  Ensure the mask fits over your nose and mouth tightly, and do not touch it during use.  Throw out disposable facemasks and gloves after using them. Do not reuse.  Wash your hands immediately after removing your facemask and gloves.  If your personal clothing becomes contaminated, carefully remove clothing and launder. Wash your hands after handling contaminated clothing.  Place all used disposable facemasks, gloves, and other waste in a lined container before disposing them with other household waste.  Remove gloves and wash your hands immediately after handling these items.  Do not share dishes, glasses, or other household items with the patient  Avoid sharing household items. You should not share dishes, drinking glasses, cups, eating utensils, towels, bedding, or other items with a patient who is confirmed to have, or being evaluated for, COVID-19 infection.  After the person uses these items, you should wash them thoroughly with soap and water.  Wash laundry thoroughly  Immediately remove and wash clothes or bedding that have blood, body fluids, and/or secretions or excretions, such as sweat, saliva, sputum, nasal mucus, vomit, urine, or feces, on them.  Wear gloves when handling laundry from the patient.  Read and follow directions on labels of laundry or clothing items and detergent. In general, wash and dry with the warmest temperatures recommended on the label.  Clean all areas the individual has used often  Clean all touchable surfaces, such as counters, tabletops, doorknobs, bathroom fixtures, toilets, phones, keyboards, tablets, and bedside tables, every day. Also, clean any surfaces that may have blood, body fluids, and/or secretions or excretions on them.  Wear gloves when  cleaning surfaces the patient has come in contact with.  Use a diluted bleach solution (e.g., dilute bleach with 1 part bleach and 10 parts water) or a household disinfectant with a label that says EPA-registered for coronaviruses. To make a bleach solution at home, add 1 tablespoon of bleach to 1 quart (4 cups) of water. For a larger supply, add  cup of bleach to 1 gallon (16 cups) of water.  Read labels of cleaning products and follow recommendations provided on product labels. Labels contain instructions for safe and effective use of the cleaning product including precautions you should take when applying the product, such as wearing gloves or eye protection  and making sure you have good ventilation during use of the product.  Remove gloves and wash hands immediately after cleaning.  Monitor yourself for signs and symptoms of illness Caregivers and household members are considered close contacts, should monitor their health, and will be asked to limit movement outside of the home to the extent possible. Follow the monitoring steps for close contacts listed on the symptom monitoring form.   ? If you have additional questions, contact your local health department or call the epidemiologist on call at [phone] (available 24/7). ? This guidance is subject to change. For the most up-to-date guidance from [facility], please refer to their website: [email or web address].pl

## 2020-12-23 NOTE — TOC Transition Note (Signed)
Transition of Care [facility]) - CM/SW Discharge Note   Patient Details  Name: Wayne Walls MRN: [id number] Date of Birth: 1933/04/08  [facility] CM/SW Contact:  Verdell Carmine, RN Phone Number: 12/23/2020, 8:06 AM   Clinical Narrative:    Patient will go home with PT OT SLP from [facility]. Wife declined 3:1, no other needs identified   Final next level of care: Home w Home Health Services Barriers to Discharge: Continued Medical Work up   Patient Goals and CMS Choice   CMS Medicare.gov Compare Post Acute Care list provided to:: Patient Represenative (must comment) Choice offered to / list presented to : Spouse  Discharge Placement                       Discharge Plan and Services   Discharge Planning Services: CM Consult Post Acute Care Choice: Home Health                    HH Arranged: PT,OT,Speech Therapy HH Agency: [place] Date [place]: 12/23/20 Time [place] Agency Contacted: [phone] Representative spoke with at [place]: Adela Lank  Social Determinants of Health (SDOH) Interventions     Readmission Risk Interventions No flowsheet data found.

## 2020-12-23 NOTE — Progress Notes (Addendum)
Physical Therapy Treatment Patient Details Name: Wayne Walls MRN: [id number] DOB: [birth year] Today's Date: 12/23/2020    History of Present Illness Pt is an 87 y.o. male admitted 12/19/20 with frequent falls and suspected stroke; tested (+) COVID-19. 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 PMH includes dementia, COPD, glaucoma.   PT Comments    Pt progressing well with mobility, motivated to participate. Session focused on transfer and gait training with RW, pt with improving activity tolerance and stability at min guard-level. Spoke with wife on phone at end of session to discuss pt's current assist-level and DME needs; wife confirmed pt to return home with 24/7 support available. Will need RW.    SpO2 94% on RA    Follow Up Recommendations  Home health PT;Supervision/Assistance - 24 hour     Equipment Recommendations  Rolling walker with 5" wheels    Recommendations for Other Services       Precautions / Restrictions Precautions Precautions: Fall Restrictions Weight Bearing Restrictions: No    Mobility  Bed Mobility Overal bed mobility: Needs Assistance Bed Mobility: Supine to Sit     Supine to sit: Supervision     General bed mobility comments: Increased time and effort, cues to stay on task  Transfers Overall transfer level: Needs assistance Equipment used: Rolling walker (2 wheeled) Transfers: Sit to/from Stand Sit to Stand: Min guard         General transfer comment: Increased time and effort standing from low bed height to RW, able to stand without assist, close min guard for balance; multiple sit<>stands from recliner to RW with close min guard  Ambulation/Gait Ambulation/Gait assistance: Min guard Gait Distance (Feet): 160 Feet Assistive device: Rolling walker (2 wheeled) Gait Pattern/deviations: Step-through  pattern;Decreased stride length;Trunk flexed;Shuffle Gait velocity: Decreased   General Gait Details: Slow, mostly steady gait with RW and min guard for balance; no shuffling noted this session, intermittent cues to maintain upright posture and closer proximity to RW; increased forward flexed posture with fatigue. Distance limited by fatigue. DOE 2-3/4   Marine scientist Rankin (Stroke Patients Only) Modified Rankin (Stroke Patients Only) Pre-Morbid Rankin Score: Slight disability Modified Rankin: Moderately severe disability     Balance Overall balance assessment: Needs assistance Sitting-balance support: No upper extremity supported;Feet supported Sitting balance-Leahy Scale: Good     Standing balance support: During functional activity;Bilateral upper extremity supported Standing balance-Leahy Scale: Poor Standing balance comment: Reliant on UE support                            Cognition Arousal/Alertness: Awake/alert Behavior During Therapy: WFL for tasks assessed/performed Overall Cognitive Status: History of cognitive impairments - at baseline                                        Exercises      General Comments General comments (skin integrity, edema, etc.): SpO2 94% on RA. Spoke with wife on phone at end of session; discussed assist and DME needs, wife confirmed 24/7 assist      Pertinent Vitals/Pain Pain Assessment: No/denies pain    Home Living  Prior Function            PT Goals (current goals can now be found in the care plan section) Progress towards PT goals: Progressing toward goals    Frequency    Min 3X/week      PT Plan Frequency needs to be updated    Co-evaluation              AM-PAC PT "6 Clicks" Mobility   Outcome Measure  Help needed turning from your back to your side while in a flat bed without using bedrails?: None Help  needed moving from lying on your back to sitting on the side of a flat bed without using bedrails?: A Little Help needed moving to and from a bed to a chair (including a wheelchair)?: A Little Help needed standing up from a chair using your arms (e.g., wheelchair or bedside chair)?: A Little Help needed to walk in hospital room?: A Little Help needed climbing 3-5 steps with a railing? : A Little 6 Click Score: 19    End of Session Equipment Utilized During Treatment: Gait belt Activity Tolerance: Patient tolerated treatment well Patient left: with chair alarm set;in chair;with call bell/phone within reach Nurse Communication: Mobility status PT Visit Diagnosis: Unsteadiness on feet (R26.81);Other abnormalities of gait and mobility (R26.89);Difficulty in walking, not elsewhere classified (R26.2)     Time: 1093-2355 PT Time Calculation (min) (ACUTE ONLY): 25 min  Charges:  $Gait Training: 8-22 mins $Therapeutic Activity: 8-22 mins                     Mabeline Caras, PT, DPT Acute Rehabilitation Services  Pager [phone] Office [place] 12/23/2020, 2:07 PM

## 2020-12-23 NOTE — Care Management Important Message (Signed)
Important Message  Patient Details  Name: Wayne Walls MRN: [id number] Date of Birth: October 26, 1933   Medicare Important Message Given:  Yes - Important Message mailed due to current National Emergency    Verbal consent obtained due to current National Emergency  Relationship to patient: Self Contact Name: Aariv Medlock Call Date: 12/23/20  Time: 1419 Phone: [phone] Outcome: No Answer/Busy Important Message mailed to: Patient address on file    Delorse Lek 12/23/2020, 2:19 PM

## 2020-12-23 NOTE — TOC Transition Note (Signed)
Transition of Care [facility]) - CM/SW Discharge Note   Patient Details  Name: Wayne Walls MRN: [id number] Date of Birth: 04-Jan-1933  [facility] CM/SW Contact:  Verdell Carmine, RN Phone Number: 12/23/2020, 11:36 AM   Clinical Narrative:    Spoke to wife she is requesting PTAr for transport back home. [employer] wiith RN to call when patient ready to go.    Final next level of care: Home w Home Health Services Barriers to Discharge: Continued Medical Work up   Patient Goals and CMS Choice   CMS Medicare.gov Compare Post Acute Care list provided to:: Patient Represenative (must comment) Choice offered to / list presented to : Spouse  Discharge Placement                       Discharge Plan and Services   Discharge Planning Services: CM Consult Post Acute Care Choice: Home Health                    HH Arranged: PT,OT,Speech Therapy HH Agency: [place] Date [place]: 12/23/20 Time [place] Agency Contacted: [phone] Representative spoke with at [place]: Adela Lank  Social Determinants of Health (SDOH) Interventions     Readmission Risk Interventions No flowsheet data found.

## 2020-12-23 NOTE — Discharge Summary (Addendum)
PATIENT DETAILS Name: Wayne Walls Age: 87 y.o. Sex: male Date of Birth: [birth year] MRN: [id number]. Admitting Physician: Rhetta Mura, DO WUJ:[id number], Richard, MD  Admit Date: 12/19/2020 Discharge date: 12/23/2020  Recommendations for Outpatient Follow-up:  1. Follow up with PCP in 1-2 weeks 2. Please obtain CMP/CBC in one week 3. Repeat Chest Xray in 4-6 week 4. Please ensure follow up with Cardiology  Admitted From:  Home  Disposition: Home with home health services   Home Health: No  Equipment/Devices: None  Discharge Condition: Stable  CODE STATUS: FULL CODE  Diet recommendation:  Diet Order            Diet - low sodium heart healthy           Diet regular Room service appropriate? Yes; Fluid consistency: Thin  Diet effective now                  Brief Narrative: Patient is a 87 y.o. male with PMHx of dementia, COPD, chronic polycythemia-who presented to the hospital with frequent falls and right facial droop-he was found acute ischemic stroke-he was also found to have COVID-19 pneumonia with mild hypoxemia.  COVID-19 vaccinated status: Vaccinated-but not boosted.  Significant Events: 1/17>> Admit to [facility] for acute CVA/COVID-19 pneumonia with hypoxia.  Significant studies: 1/17>>Chest x-ray: Bibasilar atelectasis right>> left. 1/17>> CT head: No acute intracranial abnormality-large 9 cm arachnoid cyst. 1/18>> MRI brain: Subacute CVA of the posterior left frontal centrum semiovale. 1/18>> MRA head: No large vessel occlusion 1/18>> MRA neck: No significant stenosis. 1/18>> A1c 5.7 1/18>> LDL: 114 1/19>>Echo:EF 50-55%, small mobile density in the aortic valve  COVID-19 medications: Steroids: 1/17>> Remdesivir: 1/17>>1/21  Antibiotics: None  Microbiology data: None  Procedures: None  Consults: Neurology, cardiology  Brief Hospital Course: Acute CVA: Secondary to small vessel disease-discussed with Dr. Leonie Man on 1/20  regarding echo findings-he suspects that the aortic valve mobile density is a incidental finding.  He continues to recommend aspirin/Plavix for 3 weeks followed by Plavix alone.    Mobile density on aortic valve: Seen on TTE on 1/19-concern for myxoma or fibroblastoma.  No clinical scenario for infective endocarditis.  Evaluated by cardiology with plans for outpatient TEE.   Acute Hypoxic Resp Failure due to Covid 19 Viral pneumonia: Improving-titrated to room air today-has completed a course of Remdesivir-since on room air-does not need any further Remdesivir-we will discharge on some tapering steroids.    COVID-19 Labs:  Recent Labs    12/21/20 0050 12/22/20 0029 12/23/20 0023  DDIMER 0.77* 1.02* 0.41  CRP 2.4* 2.1* 1.0*    Lab Results  Component Value Date   SARSCOV2NAA POSITIVE (A) 12/19/2020     HLD: Continue statin  Dementia: At baseline-very mildly but pleasantly confused.  Continue Aricept.  Glaucoma: Continue latanoprost  History of polycythemia: On antiplatelet agents.  RN pressure injury documentation: Pressure Injury 12/20/20 Buttocks Medial Stage 2 -  Partial thickness loss of dermis presenting as a shallow open injury with a red, pink wound bed without slough. Non Blanching pressure are Buttocks (Active)  12/20/20 1855  Location: Buttocks  Location Orientation: Medial  Staging: Stage 2 -  Partial thickness loss of dermis presenting as a shallow open injury with a red, pink wound bed without slough.  Wound Description (Comments): Non Blanching pressure are Buttocks  Present on Admission: Yes   Discharge Diagnoses:  Principal Problem:   Stroke [facility]) Active Problems:   Frequent falls   Polycythemia   Facial droop  Acute respiratory failure with hypoxia [facility])   Discharge Instructions:    Person Under Monitoring Name: Wayne Walls  Location: [place] [address]   Infection Prevention Recommendations for Individuals  Confirmed to have, or Being Evaluated for, 2019 Novel Coronavirus (COVID-19) Infection Who Receive Care at Home  Individuals who are confirmed to have, or are being evaluated for, COVID-19 should follow the prevention steps below until a healthcare provider or local or state health department says they can return to normal activities.  Stay home except to get medical care You should restrict activities outside your home, except for getting medical care. Do not go to work, school, or public areas, and do not use public transportation or taxis.  Call ahead before visiting your doctor Before your medical appointment, call the healthcare provider and tell them that you have, or are being evaluated for, COVID-19 infection. This will help the healthcare provider's office take steps to keep other people from getting infected. Ask your healthcare provider to call the local or state health department.  Monitor your symptoms Seek prompt medical attention if your illness is worsening (e.g., difficulty breathing). Before going to your medical appointment, call the healthcare provider and tell them that you have, or are being evaluated for, COVID-19 infection. Ask your healthcare provider to call the local or state health department.  Wear a facemask You should wear a facemask that covers your nose and mouth when you are in the same room with other people and when you visit a healthcare provider. People who live with or visit you should also wear a facemask while they are in the same room with you.  Separate yourself from other people in your home As much as possible, you should stay in a different room from other people in your home. Also, you should use a separate bathroom, if available.  Avoid sharing household items You should not share dishes, drinking glasses, cups, eating utensils, towels, bedding, or other items with other people in your home. After using these items, you should wash them  thoroughly with soap and water.  Cover your coughs and sneezes Cover your mouth and nose with a tissue when you cough or sneeze, or you can cough or sneeze into your sleeve. Throw used tissues in a lined trash can, and immediately wash your hands with soap and water for at least 20 seconds or use an alcohol-based hand rub.  Wash your [facility] your hands often and thoroughly with soap and water for at least 20 seconds. You can use an alcohol-based hand sanitizer if soap and water are not available and if your hands are not visibly dirty. Avoid touching your eyes, nose, and mouth with unwashed hands.   Prevention Steps for Caregivers and Household Members of Individuals Confirmed to have, or Being Evaluated for, COVID-19 Infection Being Cared for in the Home  If you live with, or provide care at home for, a person confirmed to have, or being evaluated for, COVID-19 infection please follow these guidelines to prevent infection:  Follow healthcare provider's instructions Make sure that you understand and can help the patient follow any healthcare provider instructions for all care.  Provide for the patient's basic needs You should help the patient with basic needs in the home and provide support for getting groceries, prescriptions, and other personal needs.  Monitor the patient's symptoms If they are getting sicker, call his or her medical provider and tell them that the patient has, or  is being evaluated for, COVID-19 infection. This will help the healthcare provider's office take steps to keep other people from getting infected. Ask the healthcare provider to call the local or state health department.  Limit the number of people who have contact with the patient  If possible, have only one caregiver for the patient.  Other household members should stay in another home or place of residence. If this is not possible, they should stay  in another room, or be separated from the  patient as much as possible. Use a separate bathroom, if available.  Restrict visitors who do not have an essential need to be in the home.  Keep older adults, very young children, and other sick people away from the patient Keep older adults, very young children, and those who have compromised immune systems or chronic health conditions away from the patient. This includes people with chronic heart, lung, or kidney conditions, diabetes, and cancer.  Ensure good ventilation Make sure that shared spaces in the home have good air flow, such as from an air conditioner or an opened window, weather permitting.  Wash your hands often  Wash your hands often and thoroughly with soap and water for at least 20 seconds. You can use an alcohol based hand sanitizer if soap and water are not available and if your hands are not visibly dirty.  Avoid touching your eyes, nose, and mouth with unwashed hands.  Use disposable paper towels to dry your hands. If not available, use dedicated cloth towels and replace them when they become wet.  Wear a facemask and gloves  Wear a disposable facemask at all times in the room and gloves when you touch or have contact with the patient's blood, body fluids, and/or secretions or excretions, such as sweat, saliva, sputum, nasal mucus, vomit, urine, or feces.  Ensure the mask fits over your nose and mouth tightly, and do not touch it during use.  Throw out disposable facemasks and gloves after using them. Do not reuse.  Wash your hands immediately after removing your facemask and gloves.  If your personal clothing becomes contaminated, carefully remove clothing and launder. Wash your hands after handling contaminated clothing.  Place all used disposable facemasks, gloves, and other waste in a lined container before disposing them with other household waste.  Remove gloves and wash your hands immediately after handling these items.  Do not share dishes, glasses, or  other household items with the patient  Avoid sharing household items. You should not share dishes, drinking glasses, cups, eating utensils, towels, bedding, or other items with a patient who is confirmed to have, or being evaluated for, COVID-19 infection.  After the person uses these items, you should wash them thoroughly with soap and water.  Wash laundry thoroughly  Immediately remove and wash clothes or bedding that have blood, body fluids, and/or secretions or excretions, such as sweat, saliva, sputum, nasal mucus, vomit, urine, or feces, on them.  Wear gloves when handling laundry from the patient.  Read and follow directions on labels of laundry or clothing items and detergent. In general, wash and dry with the warmest temperatures recommended on the label.  Clean all areas the individual has used often  Clean all touchable surfaces, such as counters, tabletops, doorknobs, bathroom fixtures, toilets, phones, keyboards, tablets, and bedside tables, every day. Also, clean any surfaces that may have blood, body fluids, and/or secretions or excretions on them.  Wear gloves when cleaning surfaces the patient has come in contact  with.  Use a diluted bleach solution (e.g., dilute bleach with 1 part bleach and 10 parts water) or a household disinfectant with a label that says EPA-registered for coronaviruses. To make a bleach solution at home, add 1 tablespoon of bleach to 1 quart (4 cups) of water. For a larger supply, add  cup of bleach to 1 gallon (16 cups) of water.  Read labels of cleaning products and follow recommendations provided on product labels. Labels contain instructions for safe and effective use of the cleaning product including precautions you should take when applying the product, such as wearing gloves or eye protection and making sure you have good ventilation during use of the product.  Remove gloves and wash hands immediately after cleaning.  Monitor yourself for  signs and symptoms of illness Caregivers and household members are considered close contacts, should monitor their health, and will be asked to limit movement outside of the home to the extent possible. Follow the monitoring steps for close contacts listed on the symptom monitoring form.   ? If you have additional questions, contact your local health department or call the epidemiologist on call at [phone] (available 24/7). ? This guidance is subject to change. For the most up-to-date guidance from CDC, please refer to their website: YouBlogs.pl    Activity:  As tolerated with Full fall precautions use walker/cane & assistance as needed  Discharge Instructions    Call MD for:  difficulty breathing, headache or visual disturbances   Complete by: As directed    Diet - low sodium heart healthy   Complete by: As directed    Discharge instructions   Complete by: As directed    1.)  21 days of isolation from 12/19/2020  2.)  Please follow-up with cardiology as instructed.  3.)  Take aspirin along with Plavix for 18 more days-after that stop aspirin and just stay on Plavix.   Follow with Primary MD  Burnard Bunting, MD in 1-2 weeks  Please get a complete blood count and chemistry panel checked by your Primary MD at your next visit, and again as instructed by your Primary MD.  Get Medicines reviewed and adjusted: Please take all your medications with you for your next visit with your Primary MD  Laboratory/radiological data: Please request your Primary MD to go over all hospital tests and procedure/radiological results at the follow up, please ask your Primary MD to get all Hospital records sent to his/her office.  In some cases, they will be blood work, cultures and biopsy results pending at the time of your discharge. Please request that your primary care M.D. follows up on these results.  Also Note the  following: If you experience worsening of your admission symptoms, develop shortness of breath, life threatening emergency, suicidal or homicidal thoughts you must seek medical attention immediately by calling 911 or calling your MD immediately  if symptoms less severe.  You must read complete instructions/literature along with all the possible adverse reactions/side effects for all the Medicines you take and that have been prescribed to you. Take any new Medicines after you have completely understood and accpet all the possible adverse reactions/side effects.   Do not drive when taking Pain medications or sleeping medications (Benzodaizepines)  Do not take more than prescribed Pain, Sleep and Anxiety Medications. It is not advisable to combine anxiety,sleep and pain medications without talking with your primary care practitioner  Special Instructions: If you have smoked or chewed Tobacco  in the last 2 yrs please  stop smoking, stop any regular Alcohol  and or any Recreational drug use.  Wear Seat belts while driving.  Please note: You were cared for by a hospitalist during your hospital stay. Once you are discharged, your primary care physician will handle any further medical issues. Please note that NO REFILLS for any discharge medications will be authorized once you are discharged, as it is imperative that you return to your primary care physician (or establish a relationship with a primary care physician if you do not have one) for your post hospital discharge needs so that they can reassess your need for medications and monitor your lab values.   Increase activity slowly   Complete by: As directed    No dressing needed   Complete by: As directed      Allergies as of 12/23/2020   No Known Allergies     Medication List    STOP taking these medications   aspirin 81 MG tablet Replaced by: aspirin 81 MG EC tablet     TAKE these medications   albuterol 108 (90 Base) MCG/ACT  inhaler Commonly known as: VENTOLIN HFA Inhale 1-2 puffs into the lungs every 4 (four) hours as needed for wheezing or shortness of breath.   aspirin 81 MG EC tablet Take 1 tablet (81 mg total) by mouth in the morning for 18 days. Swallow whole.  Take aspirin along with Plavix for 18 additional days-then stop aspirin and stay only on Plavix. Replaces: aspirin 81 MG tablet   atorvastatin 20 MG tablet Commonly known as: LIPITOR Take 1 tablet (20 mg total) by mouth daily.   clopidogrel 75 MG tablet Commonly known as: PLAVIX Take 1 tablet (75 mg total) by mouth daily.   Culturelle Caps Take by mouth.   donepezil 5 MG tablet Commonly known as: ARICEPT Take 5 mg by mouth at bedtime.   hydrocortisone 2.5 % cream Apply 1 application topically daily.   ketoconazole 2 % cream Commonly known as: NIZORAL Apply 1 application topically daily as needed.   latanoprost 0.005 % ophthalmic solution Commonly known as: XALATAN Place 1 drop into the left eye at bedtime. As directed   pantoprazole 40 MG tablet Commonly known as: PROTONIX Take 1 tablet (40 mg total) by mouth daily.   polyethylene glycol 17 g packet Commonly known as: MiraLax Take 17 g by mouth daily.   potassium chloride 10 MEQ tablet Commonly known as: KLOR-CON TK 1 T PO D            Discharge Care Instructions  (From admission, onward)         Start     Ordered   12/23/20 0000  No dressing needed        12/23/20 1055          Follow-up Information    Rudean Haskell A, MD Follow up on 02/03/2021.   Specialty: Cardiology Why: at [place] information: [place] [address] [address], [facility] Follow up.   Specialty: Home Health Services Why: your [facility] health provider Contact information: [place] [address] [phone]        Burnard Bunting, MD. Schedule an appointment as soon as possible for a  visit in 1 week(s).   Specialty: Internal Medicine Contact information: [address] [place] [address] [phone]              No Known Allergies  Other Procedures/Studies: CT HEAD WO CONTRAST  Result Date: 12/19/2020 CLINICAL DATA:  Neuro deficit, right facial droop and dysphagia. Status post fall EXAM: CT HEAD WITHOUT CONTRAST TECHNIQUE: Contiguous axial images were obtained from the base of the skull through the vertex without intravenous contrast. COMPARISON:  None. FINDINGS: Brain: Cerebral ventricle sizes are concordant with the degree of cerebral volume loss. Patchy and confluent areas of decreased attenuation are noted throughout the deep and periventricular white matter of the cerebral hemispheres bilaterally, compatible with chronic microvascular ischemic disease. No evidence of large-territorial acute infarction. No parenchymal hemorrhage. No mass lesion. No extra-axial collection. Large 9 cm left anterior temporal cystic lesion similar to cerebrospinal fluid. Associated mass effect on the surrounding cerebral cortex. Another cystic lesion is noted along the frontal horn of the left lateral ventricle (5:42). No midline shift. No hydrocephalus. Basilar cisterns are patent. Vascular: No hyperdense vessel. Atherosclerotic calcifications are present within the cavernous internal carotid arteries. Skull: No acute fracture or focal lesion. Sinuses/Orbits: Paranasal sinuses and mastoid air cells are clear. The orbits are unremarkable. Other: None. IMPRESSION: 1. No acute intracranial abnormality. 2. Large 9 cm left anterior temporal cystic lesion likely representing an arachnoid cyst. Associated chronic mass effect on the surrounding frontal and temporal lobe. Differential diagnosis includes epidermoid cyst. Comparison with prior cross-sectional imaging would be of value to evaluate stability. Electronically Signed   By: Iven Finn M.D.   On: 12/19/2020 18:48   MR ANGIO HEAD  WO CONTRAST  Result Date: 12/20/2020 CLINICAL DATA:  Initial evaluation for acute TIA, right-sided facial droop, falls. EXAM: MRI HEAD WITHOUT CONTRAST MRA HEAD WITHOUT CONTRAST MRA NECK WITHOUT CONTRAST TECHNIQUE: Multiplanar, multiecho pulse sequences of the brain and surrounding structures were obtained without intravenous contrast. Angiographic images of the Circle of Willis were obtained using MRA technique without intravenous contrast. Angiographic images of the neck were obtained using MRA technique without intravenous contrast. Carotid stenosis measurements (when applicable) are obtained utilizing NASCET criteria, using the distal internal carotid diameter as the denominator. COMPARISON:  Prior CT from 12/19/2020. FINDINGS: MRI HEAD FINDINGS Brain: Diffuse prominence of the CSF containing spaces compatible with generalized age-related cerebral atrophy. Patchy T2/FLAIR hyperintensity within the periventricular and deep white matter both cerebral hemispheres consistent with chronic small vessel ischemic disease, mild in nature. Patchy involvement of the pons noted. Superimposed remote hemorrhagic lacunar infarct present at the left basal ganglia. Few additional small remote left basal ganglia and left thalamic lacunar infarcts noted. 9 mm focus of mild diffusion signal abnormality seen involving the subcortical and deep white matter of the posterior left frontal centrum semi ovale (series 5, image 94). No discernible associated ADC correlate. Finding likely reflects a small focus of subacute small vessel ischemia. No associated hemorrhage or mass effect. No other diffusion abnormality to suggest acute or subacute ischemia. Gray-white matter differentiation maintained. No other areas of remote cortical infarction. No other foci of susceptibility artifact to suggest acute or chronic intracranial hemorrhage. Well-circumscribed cystic lesion measuring 7.3 x 5.0 x 6.9 cm seen at the left frontotemporal region.  This follows CSF signal intensity on all pulse sequences, consistent with a benign arachnoid cyst. Associated mild regional mass effect with trace 2-3 mm of left-to-right shift. No hydrocephalus or ventricular trapping. No other mass lesion or mass effect. No extra-axial fluid collection. Pituitary gland and suprasellar region normal. Midline structures intact. Vascular: Major intracranial vascular flow voids are maintained. Skull and upper cervical spine: Craniocervical junction within normal limits. Bone marrow signal intensity normal.  No scalp soft tissue abnormality. Sinuses/Orbits: Globes and orbital soft tissues within normal limits. Scattered mucosal thickening noted within the ethmoidal air cells and maxillary sinuses. Paranasal sinuses are otherwise clear. Trace fluid signal intensity noted within the mastoid air cells bilaterally, of doubtful significance. Inner ear structures grossly within normal limits. Other: None. MRA HEAD FINDINGS ANTERIOR CIRCULATION: Visualized distal cervical segments of the internal carotid arteries are widely patent with antegrade flow. Petrous, cavernous, and supraclinoid segments widely patent without stenosis or other abnormality. A1 segments patent bilaterally. Normal anterior communicating artery complex. Anterior cerebral arteries widely patent to their distal aspects. No M1 stenosis or occlusion. Normal MCA bifurcations. Distal MCA branches well perfused bilaterally. Mass effect with superior displacement of the left MCA by the large left-sided arachnoid cyst noted. POSTERIOR CIRCULATION: Both V4 segments widely patent to the vertebrobasilar junction. Both PICA origins patent and normal. Basilar widely patent to its distal aspect. Superior cerebellar arteries patent bilaterally. Both PCAs supplied via the basilar well perfused to their distal aspects. No intracranial aneurysm. MRA NECK FINDINGS AORTIC ARCH: Examination technically limited by lack of IV contrast.  Visualized aortic arch of normal caliber with normal 3 vessel morphology. No hemodynamically significant stenosis seen about the origin of the great vessels. RIGHT CAROTID SYSTEM: Right CCA patent from its origin to the bifurcation without stenosis. Mild for age atheromatous irregularity about the right bifurcation/proximal right ICA without hemodynamically significant stenosis. Right ICA widely patent distally without stenosis, evidence for dissection, or occlusion. LEFT CAROTID SYSTEM: Left CCA patent from its origin to the bifurcation without stenosis. Mild for age atheromatous irregularity about the left bifurcation/proximal left ICA without significant stenosis. Left ICA patent distally without stenosis, evidence for dissection, or occlusion. VERTEBRAL ARTERIES: Both vertebral arteries appear to arise from the subclavian arteries. No visible proximal subclavian artery stenosis. Proximal left V1 segment not well seen due to tortuosity and lack of IV contrast. Visualized portions of the vertebral arteries are patent within the neck without stenosis, evidence for dissection or occlusion. IMPRESSION: MRI HEAD IMPRESSION: 1. 9 mm focus of mild diffusion abnormality involving the subcortical/deep white matter of the posterior left frontal centrum semi ovale, most likely reflecting a small focus of subacute small vessel ischemia. No associated hemorrhage or mass effect. 2. No other acute intracranial abnormality. 3. 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 4. 7.3 x 5.0 x 6.9 cm benign arachnoid cyst at the left frontotemporal region with associated regional mass effect and trace 2-3 mm of left-to-right shift. MRA HEAD IMPRESSION: Negative intracranial MRA. No large vessel occlusion. No hemodynamically significant or correctable stenosis. MRA NECK IMPRESSION: Negative MRA of the neck. No  hemodynamically significant or critical flow limiting stenosis within the neck. Electronically Signed   By: Rise Mu M.D.   On: 12/20/2020 03:35   MR ANGIO NECK WO CONTRAST  Result Date: 12/20/2020 CLINICAL DATA:  Initial evaluation for acute TIA, right-sided facial droop, falls. EXAM: MRI HEAD WITHOUT CONTRAST MRA HEAD WITHOUT CONTRAST MRA NECK WITHOUT CONTRAST TECHNIQUE: Multiplanar, multiecho pulse sequences of the brain and surrounding structures were obtained without intravenous contrast. Angiographic images of the Circle of Willis were obtained using MRA technique without intravenous contrast. Angiographic images of the neck were obtained using MRA technique without intravenous contrast. Carotid stenosis measurements (when applicable) are obtained utilizing NASCET criteria, using the distal internal carotid diameter as the denominator. COMPARISON:  Prior CT from 12/19/2020.  FINDINGS: MRI HEAD FINDINGS Brain: Diffuse prominence of the CSF containing spaces compatible with generalized age-related cerebral atrophy. Patchy T2/FLAIR hyperintensity within the periventricular and deep white matter both cerebral hemispheres consistent with chronic small vessel ischemic disease, mild in nature. Patchy involvement of the pons noted. Superimposed remote hemorrhagic lacunar infarct present at the left basal ganglia. Few additional small remote left basal ganglia and left thalamic lacunar infarcts noted. 9 mm focus of mild diffusion signal abnormality seen involving the subcortical and deep white matter of the posterior left frontal centrum semi ovale (series 5, image 94). No discernible associated ADC correlate. Finding likely reflects a small focus of subacute small vessel ischemia. No associated hemorrhage or mass effect. No other diffusion abnormality to suggest acute or subacute ischemia. Gray-white matter differentiation maintained. No other areas of remote cortical infarction. No other foci of  susceptibility artifact to suggest acute or chronic intracranial hemorrhage. Well-circumscribed cystic lesion measuring 7.3 x 5.0 x 6.9 cm seen at the left frontotemporal region. This follows CSF signal intensity on all pulse sequences, consistent with a benign arachnoid cyst. Associated mild regional mass effect with trace 2-3 mm of left-to-right shift. No hydrocephalus or ventricular trapping. No other mass lesion or mass effect. No extra-axial fluid collection. Pituitary gland and suprasellar region normal. Midline structures intact. Vascular: Major intracranial vascular flow voids are maintained. Skull and upper cervical spine: Craniocervical junction within normal limits. Bone marrow signal intensity normal. No scalp soft tissue abnormality. Sinuses/Orbits: Globes and orbital soft tissues within normal limits. Scattered mucosal thickening noted within the ethmoidal air cells and maxillary sinuses. Paranasal sinuses are otherwise clear. Trace fluid signal intensity noted within the mastoid air cells bilaterally, of doubtful significance. Inner ear structures grossly within normal limits. Other: None. MRA HEAD FINDINGS ANTERIOR CIRCULATION: Visualized distal cervical segments of the internal carotid arteries are widely patent with antegrade flow. Petrous, cavernous, and supraclinoid segments widely patent without stenosis or other abnormality. A1 segments patent bilaterally. Normal anterior communicating artery complex. Anterior cerebral arteries widely patent to their distal aspects. No M1 stenosis or occlusion. Normal MCA bifurcations. Distal MCA branches well perfused bilaterally. Mass effect with superior displacement of the left MCA by the large left-sided arachnoid cyst noted. POSTERIOR CIRCULATION: Both V4 segments widely patent to the vertebrobasilar junction. Both PICA origins patent and normal. Basilar widely patent to its distal aspect. Superior cerebellar arteries patent bilaterally. Both PCAs  supplied via the basilar well perfused to their distal aspects. No intracranial aneurysm. MRA NECK FINDINGS AORTIC ARCH: Examination technically limited by lack of IV contrast. Visualized aortic arch of normal caliber with normal 3 vessel morphology. No hemodynamically significant stenosis seen about the origin of the great vessels. RIGHT CAROTID SYSTEM: Right CCA patent from its origin to the bifurcation without stenosis. Mild for age atheromatous irregularity about the right bifurcation/proximal right ICA without hemodynamically significant stenosis. Right ICA widely patent distally without stenosis, evidence for dissection, or occlusion. LEFT CAROTID SYSTEM: Left CCA patent from its origin to the bifurcation without stenosis. Mild for age atheromatous irregularity about the left bifurcation/proximal left ICA without significant stenosis. Left ICA patent distally without stenosis, evidence for dissection, or occlusion. VERTEBRAL ARTERIES: Both vertebral arteries appear to arise from the subclavian arteries. No visible proximal subclavian artery stenosis. Proximal left V1 segment not well seen due to tortuosity and lack of IV contrast. Visualized portions of the vertebral arteries are patent within the neck without stenosis, evidence for dissection or occlusion. IMPRESSION: MRI HEAD IMPRESSION: 1. 9 mm  focus of mild diffusion abnormality involving the subcortical/deep white matter of the posterior left frontal centrum semi ovale, most likely reflecting a small focus of subacute small vessel ischemia. No associated hemorrhage or mass effect. 2. No other acute intracranial abnormality. 3. 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 4. 7.3 x 5.0 x 6.9 cm benign arachnoid cyst at the left frontotemporal region with associated regional mass effect and trace 2-3 mm of left-to-right shift. MRA HEAD  IMPRESSION: Negative intracranial MRA. No large vessel occlusion. No hemodynamically significant or correctable stenosis. MRA NECK IMPRESSION: Negative MRA of the neck. No hemodynamically significant or critical flow limiting stenosis within the neck. Electronically Signed   By: Jeannine Boga M.D.   On: 12/20/2020 03:35   MR BRAIN WO CONTRAST  Result Date: 12/20/2020 CLINICAL DATA:  Initial evaluation for acute TIA, right-sided facial droop, falls. EXAM: MRI HEAD WITHOUT CONTRAST MRA HEAD WITHOUT CONTRAST MRA NECK WITHOUT CONTRAST TECHNIQUE: Multiplanar, multiecho pulse sequences of the brain and surrounding structures were obtained without intravenous contrast. Angiographic images of the Circle of Willis were obtained using MRA technique without intravenous contrast. Angiographic images of the neck were obtained using MRA technique without intravenous contrast. Carotid stenosis measurements (when applicable) are obtained utilizing NASCET criteria, using the distal internal carotid diameter as the denominator. COMPARISON:  Prior CT from 12/19/2020. FINDINGS: MRI HEAD FINDINGS Brain: Diffuse prominence of the CSF containing spaces compatible with generalized age-related cerebral atrophy. Patchy T2/FLAIR hyperintensity within the periventricular and deep white matter both cerebral hemispheres consistent with chronic small vessel ischemic disease, mild in nature. Patchy involvement of the pons noted. Superimposed remote hemorrhagic lacunar infarct present at the left basal ganglia. Few additional small remote left basal ganglia and left thalamic lacunar infarcts noted. 9 mm focus of mild diffusion signal abnormality seen involving the subcortical and deep white matter of the posterior left frontal centrum semi ovale (series 5, image 94). No discernible associated ADC correlate. Finding likely reflects a small focus of subacute small vessel ischemia. No associated hemorrhage or mass effect. No other diffusion  abnormality to suggest acute or subacute ischemia. Gray-white matter differentiation maintained. No other areas of remote cortical infarction. No other foci of susceptibility artifact to suggest acute or chronic intracranial hemorrhage. Well-circumscribed cystic lesion measuring 7.3 x 5.0 x 6.9 cm seen at the left frontotemporal region. This follows CSF signal intensity on all pulse sequences, consistent with a benign arachnoid cyst. Associated mild regional mass effect with trace 2-3 mm of left-to-right shift. No hydrocephalus or ventricular trapping. No other mass lesion or mass effect. No extra-axial fluid collection. Pituitary gland and suprasellar region normal. Midline structures intact. Vascular: Major intracranial vascular flow voids are maintained. Skull and upper cervical spine: Craniocervical junction within normal limits. Bone marrow signal intensity normal. No scalp soft tissue abnormality. Sinuses/Orbits: Globes and orbital soft tissues within normal limits. Scattered mucosal thickening noted within the ethmoidal air cells and maxillary sinuses. Paranasal sinuses are otherwise clear. Trace fluid signal intensity noted within the mastoid air cells bilaterally, of doubtful significance. Inner ear structures grossly within normal limits. Other: None. MRA HEAD FINDINGS ANTERIOR CIRCULATION: Visualized distal cervical segments of the internal carotid arteries are widely patent with antegrade flow. Petrous, cavernous, and supraclinoid segments widely patent without stenosis or other abnormality. A1 segments patent bilaterally. Normal anterior communicating artery complex. Anterior cerebral arteries widely patent to their distal aspects. No M1 stenosis  or occlusion. Normal MCA bifurcations. Distal MCA branches well perfused bilaterally. Mass effect with superior displacement of the left MCA by the large left-sided arachnoid cyst noted. POSTERIOR CIRCULATION: Both V4 segments widely patent to the  vertebrobasilar junction. Both PICA origins patent and normal. Basilar widely patent to its distal aspect. Superior cerebellar arteries patent bilaterally. Both PCAs supplied via the basilar well perfused to their distal aspects. No intracranial aneurysm. MRA NECK FINDINGS AORTIC ARCH: Examination technically limited by lack of IV contrast. Visualized aortic arch of normal caliber with normal 3 vessel morphology. No hemodynamically significant stenosis seen about the origin of the great vessels. RIGHT CAROTID SYSTEM: Right CCA patent from its origin to the bifurcation without stenosis. Mild for age atheromatous irregularity about the right bifurcation/proximal right ICA without hemodynamically significant stenosis. Right ICA widely patent distally without stenosis, evidence for dissection, or occlusion. LEFT CAROTID SYSTEM: Left CCA patent from its origin to the bifurcation without stenosis. Mild for age atheromatous irregularity about the left bifurcation/proximal left ICA without significant stenosis. Left ICA patent distally without stenosis, evidence for dissection, or occlusion. VERTEBRAL ARTERIES: Both vertebral arteries appear to arise from the subclavian arteries. No visible proximal subclavian artery stenosis. Proximal left V1 segment not well seen due to tortuosity and lack of IV contrast. Visualized portions of the vertebral arteries are patent within the neck without stenosis, evidence for dissection or occlusion. IMPRESSION: MRI HEAD IMPRESSION: 1. 9 mm focus of mild diffusion abnormality involving the subcortical/deep white matter of the posterior left frontal centrum semi ovale, most likely reflecting a small focus of subacute small vessel ischemia. No associated hemorrhage or mass effect. 2. No other acute intracranial abnormality. 3. Underlying age-related cerebral atrophy with mild chronic small vessel ischemic disease, with remote lacunar infarcts involving the left basal ganglia and left  thalamus. 4. 7.3 x 5.0 x 6.9 cm benign arachnoid cyst at the left frontotemporal region with associated regional mass effect and trace 2-3 mm of left-to-right shift. MRA HEAD IMPRESSION: Negative intracranial MRA. No large vessel occlusion. No hemodynamically significant or correctable stenosis. MRA NECK IMPRESSION: Negative MRA of the neck. No hemodynamically significant or critical flow limiting stenosis within the neck. Electronically Signed   By: Jeannine Boga M.D.   On: 12/20/2020 03:35   DG Chest Portable 1 View  Result Date: 12/19/2020 CLINICAL DATA:  Initial evaluation for acute dysphagia. EXAM: PORTABLE CHEST 1 VIEW COMPARISON:  Prior radiograph from 11/02/2016. FINDINGS: Exaggeration of the cardiac silhouette related to AP technique and shallow lung inflation. Mediastinal silhouette normal. Tortuosity the intrathoracic aorta noted. Slight left-to-right deviation of the tracheal air column at the level of the aortic knob, similar to previous. No appreciable esophageal dilatation. Lungs are hypoinflated. Streaky and linear opacities at the right greater than left lung bases felt to be most consistent with atelectasis. No other focal infiltrates. No pulmonary edema or visible pleural effusion. Left costophrenic angle incompletely visualized. No pneumothorax. No acute osseous finding.  Osteopenia noted. IMPRESSION: 1. Shallow lung inflation with associated bibasilar atelectasis, right greater than left. 2. No other active cardiopulmonary disease. Electronically Signed   By: Jeannine Boga M.D.   On: 12/19/2020 19:39   ECHOCARDIOGRAM LIMITED BUBBLE STUDY  Result Date: 12/21/2020    ECHOCARDIOGRAM LIMITED REPORT   Patient Name:   Wayne Walls Date of Exam: 12/21/2020 Medical Rec #:  [id number]       Height:       73.0 in Accession #:    [id number]  Weight:       191.0 lb Date of Birth:  09-28-1933        BSA:          2.110 m Patient Age:    78 years        BP:           112/66 mmHg  Patient Gender: M               HR:           65 bpm. Exam Location:  Inpatient Procedure: Limited Echo, Limited Color Doppler and Cardiac Doppler Indications:    stroke 434.91  History:        Patient has no prior history of Echocardiogram examinations.                 Covid; Risk Factors:Sleep Apnea.  Sonographer:    Johny Chess Referring Phys: [id number] JUSTIN B HOWERTER  Sonographer Comments: Image acquisition challenging due to uncooperative patient and Image acquisition challenging due to respiratory motion. IMPRESSIONS  1. Left ventricular ejection fraction, by estimation, is 50 to 55%. The left ventricle has low normal function. Left ventricular diastolic parameters are consistent with Grade I diastolic dysfunction (impaired relaxation).  2. There is a small (1.1 x 0.4 cm) mobile density on the aortic valve passing between the LVOT and aorta. It appears to be on the left coronary cusp. Recommend TEE to better evaluate. The aortic valve is tricuspid. Aortic valve regurgitation is moderate.  3. There is normal pulmonary artery systolic pressure.  4. The inferior vena cava is normal in size with <50% respiratory variability, suggesting right atrial pressure of 8 mmHg.  5. Agitated saline contrast bubble study was negative, with no evidence of any interatrial shunt. FINDINGS  Left Ventricle: Left ventricular ejection fraction, by estimation, is 50 to 55%. The left ventricle has low normal function. Left ventricular diastolic parameters are consistent with Grade I diastolic dysfunction (impaired relaxation). Normal left ventricular filling pressure. Right Ventricle: There is normal pulmonary artery systolic pressure. The tricuspid regurgitant velocity is 1.95 m/s, and with an assumed right atrial pressure of 8 mmHg, the estimated right ventricular systolic pressure is 123XX123 mmHg. Tricuspid Valve: Tricuspid valve regurgitation is trivial. Aortic Valve: There is a small (1.1 x 0.4 cm) mobile density on the  aortic valve passing between the LVOT and aorta. It appears to be on the left coronary cusp. Recommend TEE to better evaluate. The aortic valve is tricuspid. Aortic valve regurgitation is moderate. Aortic regurgitation PHT measures 320 msec. Pulmonic Valve: Pulmonic valve regurgitation is mild. Venous: The inferior vena cava is normal in size with less than 50% respiratory variability, suggesting right atrial pressure of 8 mmHg. IAS/Shunts: Agitated saline contrast was given intravenously to evaluate for intracardiac shunting. Agitated saline contrast bubble study was negative, with no evidence of any interatrial shunt. LEFT VENTRICLE PLAX 2D LVIDd:         5.10 cm  Diastology LVIDs:         3.80 cm  LV e' medial:   6.74 cm/s LV PW:         0.90 cm  LV E/e' medial: 8.3 LV IVS:        1.00 cm LVOT diam:     2.30 cm LVOT Area:     4.15 cm  IVC IVC diam: 1.80 cm LEFT ATRIUM         Index LA diam:    4.60 cm 2.18 cm/m  AORTIC  VALVE AI PHT:      320 msec  AORTA Ao Asc diam: 3.60 cm MITRAL VALVE               TRICUSPID VALVE MV Area (PHT): 2.83 cm    TR Peak grad:   15.2 mmHg MV Decel Time: 268 msec    TR Vmax:        195.00 cm/s MV E velocity: 55.70 cm/s MV A velocity: 73.30 cm/s  SHUNTS MV E/A ratio:  0.76        Systemic Diam: 2.30 cm Skeet Latch MD Electronically signed by Skeet Latch MD Signature Date/Time: 12/21/2020/6:09:09 PM    Final      TODAY-DAY OF DISCHARGE:  Subjective:   Wayne Walls today has no headache,no chest abdominal pain,no new weakness tingling or numbness, feels much better wants to go home today.   Objective:   Blood pressure 129/71, pulse 60, temperature 97.8 F (36.6 C), temperature source Axillary, resp. rate 20, height 6\' 1"  (1.854 m), weight 86.8 kg, SpO2 91 %.  Intake/Output Summary (Last 24 hours) at 12/23/2020 1056 Last data filed at 12/23/2020 0900 Gross per 24 hour  Intake 440 ml  Output 1000 ml  Net -560 ml   Filed Weights   12/21/20 0900 12/22/20 0500  12/23/20 0321  Weight: 86.6 kg 86.6 kg 86.8 kg    Exam: Awake Alert, Oriented *3, No new F.N deficits, Normal affect [place].AT,PERRAL Supple Neck,No JVD, No cervical lymphadenopathy appriciated.  Symmetrical Chest wall movement, Good air movement bilaterally, CTAB RRR,No Gallops,Rubs or new Murmurs, No Parasternal Heave +ve B.Sounds, Abd Soft, Non tender, No organomegaly appriciated, No rebound -guarding or rigidity. No Cyanosis, Clubbing or edema, No new Rash or bruise   PERTINENT RADIOLOGIC STUDIES: CT HEAD WO CONTRAST  Result Date: 12/19/2020 CLINICAL DATA:  Neuro deficit, right facial droop and dysphagia. Status post fall EXAM: CT HEAD WITHOUT CONTRAST TECHNIQUE: Contiguous axial images were obtained from the base of the skull through the vertex without intravenous contrast. COMPARISON:  None. FINDINGS: Brain: Cerebral ventricle sizes are concordant with the degree of cerebral volume loss. Patchy and confluent areas of decreased attenuation are noted throughout the deep and periventricular white matter of the cerebral hemispheres bilaterally, compatible with chronic microvascular ischemic disease. No evidence of large-territorial acute infarction. No parenchymal hemorrhage. No mass lesion. No extra-axial collection. Large 9 cm left anterior temporal cystic lesion similar to cerebrospinal fluid. Associated mass effect on the surrounding cerebral cortex. Another cystic lesion is noted along the frontal horn of the left lateral ventricle (5:42). No midline shift. No hydrocephalus. Basilar cisterns are patent. Vascular: No hyperdense vessel. Atherosclerotic calcifications are present within the cavernous internal carotid arteries. Skull: No acute fracture or focal lesion. Sinuses/Orbits: Paranasal sinuses and mastoid air cells are clear. The orbits are unremarkable. Other: None. IMPRESSION: 1. No acute intracranial abnormality. 2. Large 9 cm left anterior temporal cystic lesion likely representing an  arachnoid cyst. Associated chronic mass effect on the surrounding frontal and temporal lobe. Differential diagnosis includes epidermoid cyst. Comparison with prior cross-sectional imaging would be of value to evaluate stability. Electronically Signed   By: Iven Finn M.D.   On: 12/19/2020 18:48   MR ANGIO HEAD WO CONTRAST  Result Date: 12/20/2020 CLINICAL DATA:  Initial evaluation for acute TIA, right-sided facial droop, falls. EXAM: MRI HEAD WITHOUT CONTRAST MRA HEAD WITHOUT CONTRAST MRA NECK WITHOUT CONTRAST TECHNIQUE: Multiplanar, multiecho pulse sequences of the brain and surrounding structures were obtained without intravenous contrast. Angiographic  images of the Circle of Willis were obtained using MRA technique without intravenous contrast. Angiographic images of the neck were obtained using MRA technique without intravenous contrast. Carotid stenosis measurements (when applicable) are obtained utilizing NASCET criteria, using the distal internal carotid diameter as the denominator. COMPARISON:  Prior CT from 12/19/2020. FINDINGS: MRI HEAD FINDINGS Brain: Diffuse prominence of the CSF containing spaces compatible with generalized age-related cerebral atrophy. Patchy T2/FLAIR hyperintensity within the periventricular and deep white matter both cerebral hemispheres consistent with chronic small vessel ischemic disease, mild in nature. Patchy involvement of the pons noted. Superimposed remote hemorrhagic lacunar infarct present at the left basal ganglia. Few additional small remote left basal ganglia and left thalamic lacunar infarcts noted. 9 mm focus of mild diffusion signal abnormality seen involving the subcortical and deep white matter of the posterior left frontal centrum semi ovale (series 5, image 94). No discernible associated ADC correlate. Finding likely reflects a small focus of subacute small vessel ischemia. No associated hemorrhage or mass effect. No other diffusion abnormality to suggest  acute or subacute ischemia. Gray-white matter differentiation maintained. No other areas of remote cortical infarction. No other foci of susceptibility artifact to suggest acute or chronic intracranial hemorrhage. Well-circumscribed cystic lesion measuring 7.3 x 5.0 x 6.9 cm seen at the left frontotemporal region. This follows CSF signal intensity on all pulse sequences, consistent with a benign arachnoid cyst. Associated mild regional mass effect with trace 2-3 mm of left-to-right shift. No hydrocephalus or ventricular trapping. No other mass lesion or mass effect. No extra-axial fluid collection. Pituitary gland and suprasellar region normal. Midline structures intact. Vascular: Major intracranial vascular flow voids are maintained. Skull and upper cervical spine: Craniocervical junction within normal limits. Bone marrow signal intensity normal. No scalp soft tissue abnormality. Sinuses/Orbits: Globes and orbital soft tissues within normal limits. Scattered mucosal thickening noted within the ethmoidal air cells and maxillary sinuses. Paranasal sinuses are otherwise clear. Trace fluid signal intensity noted within the mastoid air cells bilaterally, of doubtful significance. Inner ear structures grossly within normal limits. Other: None. MRA HEAD FINDINGS ANTERIOR CIRCULATION: Visualized distal cervical segments of the internal carotid arteries are widely patent with antegrade flow. Petrous, cavernous, and supraclinoid segments widely patent without stenosis or other abnormality. A1 segments patent bilaterally. Normal anterior communicating artery complex. Anterior cerebral arteries widely patent to their distal aspects. No M1 stenosis or occlusion. Normal MCA bifurcations. Distal MCA branches well perfused bilaterally. Mass effect with superior displacement of the left MCA by the large left-sided arachnoid cyst noted. POSTERIOR CIRCULATION: Both V4 segments widely patent to the vertebrobasilar junction. Both PICA  origins patent and normal. Basilar widely patent to its distal aspect. Superior cerebellar arteries patent bilaterally. Both PCAs supplied via the basilar well perfused to their distal aspects. No intracranial aneurysm. MRA NECK FINDINGS AORTIC ARCH: Examination technically limited by lack of IV contrast. Visualized aortic arch of normal caliber with normal 3 vessel morphology. No hemodynamically significant stenosis seen about the origin of the great vessels. RIGHT CAROTID SYSTEM: Right CCA patent from its origin to the bifurcation without stenosis. Mild for age atheromatous irregularity about the right bifurcation/proximal right ICA without hemodynamically significant stenosis. Right ICA widely patent distally without stenosis, evidence for dissection, or occlusion. LEFT CAROTID SYSTEM: Left CCA patent from its origin to the bifurcation without stenosis. Mild for age atheromatous irregularity about the left bifurcation/proximal left ICA without significant stenosis. Left ICA patent distally without stenosis, evidence for dissection, or occlusion. VERTEBRAL ARTERIES: Both vertebral arteries appear  to arise from the subclavian arteries. No visible proximal subclavian artery stenosis. Proximal left V1 segment not well seen due to tortuosity and lack of IV contrast. Visualized portions of the vertebral arteries are patent within the neck without stenosis, evidence for dissection or occlusion. IMPRESSION: MRI HEAD IMPRESSION: 1. 9 mm focus of mild diffusion abnormality involving the subcortical/deep white matter of the posterior left frontal centrum semi ovale, most likely reflecting a small focus of subacute small vessel ischemia. No associated hemorrhage or mass effect. 2. No other acute intracranial abnormality. 3. 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 4. 7.3 x 5.0 x 6.9 cm benign  arachnoid cyst at the left frontotemporal region with associated regional mass effect and trace 2-3 mm of left-to-right shift. MRA HEAD IMPRESSION: Negative intracranial MRA. No large vessel occlusion. No hemodynamically significant or correctable stenosis. MRA NECK IMPRESSION: Negative MRA of the neck. No hemodynamically significant or critical flow limiting stenosis within the neck. Electronically Signed   By: Jeannine Boga M.D.   On: 12/20/2020 03:35   MR ANGIO NECK WO CONTRAST  Result Date: 12/20/2020 CLINICAL DATA:  Initial evaluation for acute TIA, right-sided facial droop, falls. EXAM: MRI HEAD WITHOUT CONTRAST MRA HEAD WITHOUT CONTRAST MRA NECK WITHOUT CONTRAST TECHNIQUE: Multiplanar, multiecho pulse sequences of the brain and surrounding structures were obtained without intravenous contrast. Angiographic images of the Circle of Willis were obtained using MRA technique without intravenous contrast. Angiographic images of the neck were obtained using MRA technique without intravenous contrast. Carotid stenosis measurements (when applicable) are obtained utilizing NASCET criteria, using the distal internal carotid diameter as the denominator. COMPARISON:  Prior CT from 12/19/2020. FINDINGS: MRI HEAD FINDINGS Brain: Diffuse prominence of the CSF containing spaces compatible with generalized age-related cerebral atrophy. Patchy T2/FLAIR hyperintensity within the periventricular and deep white matter both cerebral hemispheres consistent with chronic small vessel ischemic disease, mild in nature. Patchy involvement of the pons noted. Superimposed remote hemorrhagic lacunar infarct present at the left basal ganglia. Few additional small remote left basal ganglia and left thalamic lacunar infarcts noted. 9 mm focus of mild diffusion signal abnormality seen involving the subcortical and deep white matter of the posterior left frontal centrum semi ovale (series 5, image 94). No discernible associated ADC  correlate. Finding likely reflects a small focus of subacute small vessel ischemia. No associated hemorrhage or mass effect. No other diffusion abnormality to suggest acute or subacute ischemia. Gray-white matter differentiation maintained. No other areas of remote cortical infarction. No other foci of susceptibility artifact to suggest acute or chronic intracranial hemorrhage. Well-circumscribed cystic lesion measuring 7.3 x 5.0 x 6.9 cm seen at the left frontotemporal region. This follows CSF signal intensity on all pulse sequences, consistent with a benign arachnoid cyst. Associated mild regional mass effect with trace 2-3 mm of left-to-right shift. No hydrocephalus or ventricular trapping. No other mass lesion or mass effect. No extra-axial fluid collection. Pituitary gland and suprasellar region normal. Midline structures intact. Vascular: Major intracranial vascular flow voids are maintained. Skull and upper cervical spine: Craniocervical junction within normal limits. Bone marrow signal intensity normal. No scalp soft tissue abnormality. Sinuses/Orbits: Globes and orbital soft tissues within normal limits. Scattered mucosal thickening noted within the ethmoidal air cells and maxillary sinuses. Paranasal sinuses are otherwise clear. Trace fluid signal intensity noted within the mastoid air cells bilaterally, of doubtful significance. Inner ear structures grossly within normal limits. Other: None.  MRA HEAD FINDINGS ANTERIOR CIRCULATION: Visualized distal cervical segments of the internal carotid arteries are widely patent with antegrade flow. Petrous, cavernous, and supraclinoid segments widely patent without stenosis or other abnormality. A1 segments patent bilaterally. Normal anterior communicating artery complex. Anterior cerebral arteries widely patent to their distal aspects. No M1 stenosis or occlusion. Normal MCA bifurcations. Distal MCA branches well perfused bilaterally. Mass effect with superior  displacement of the left MCA by the large left-sided arachnoid cyst noted. POSTERIOR CIRCULATION: Both V4 segments widely patent to the vertebrobasilar junction. Both PICA origins patent and normal. Basilar widely patent to its distal aspect. Superior cerebellar arteries patent bilaterally. Both PCAs supplied via the basilar well perfused to their distal aspects. No intracranial aneurysm. MRA NECK FINDINGS AORTIC ARCH: Examination technically limited by lack of IV contrast. Visualized aortic arch of normal caliber with normal 3 vessel morphology. No hemodynamically significant stenosis seen about the origin of the great vessels. RIGHT CAROTID SYSTEM: Right CCA patent from its origin to the bifurcation without stenosis. Mild for age atheromatous irregularity about the right bifurcation/proximal right ICA without hemodynamically significant stenosis. Right ICA widely patent distally without stenosis, evidence for dissection, or occlusion. LEFT CAROTID SYSTEM: Left CCA patent from its origin to the bifurcation without stenosis. Mild for age atheromatous irregularity about the left bifurcation/proximal left ICA without significant stenosis. Left ICA patent distally without stenosis, evidence for dissection, or occlusion. VERTEBRAL ARTERIES: Both vertebral arteries appear to arise from the subclavian arteries. No visible proximal subclavian artery stenosis. Proximal left V1 segment not well seen due to tortuosity and lack of IV contrast. Visualized portions of the vertebral arteries are patent within the neck without stenosis, evidence for dissection or occlusion. IMPRESSION: MRI HEAD IMPRESSION: 1. 9 mm focus of mild diffusion abnormality involving the subcortical/deep white matter of the posterior left frontal centrum semi ovale, most likely reflecting a small focus of subacute small vessel ischemia. No associated hemorrhage or mass effect. 2. No other acute intracranial abnormality. 3. Underlying age-related cerebral  atrophy with mild chronic small vessel ischemic disease, with remote lacunar infarcts involving the left basal ganglia and left thalamus. 4. 7.3 x 5.0 x 6.9 cm benign arachnoid cyst at the left frontotemporal region with associated regional mass effect and trace 2-3 mm of left-to-right shift. MRA HEAD IMPRESSION: Negative intracranial MRA. No large vessel occlusion. No hemodynamically significant or correctable stenosis. MRA NECK IMPRESSION: Negative MRA of the neck. No hemodynamically significant or critical flow limiting stenosis within the neck. Electronically Signed   By: Jeannine Boga M.D.   On: 12/20/2020 03:35   MR BRAIN WO CONTRAST  Result Date: 12/20/2020 CLINICAL DATA:  Initial evaluation for acute TIA, right-sided facial droop, falls. EXAM: MRI HEAD WITHOUT CONTRAST MRA HEAD WITHOUT CONTRAST MRA NECK WITHOUT CONTRAST TECHNIQUE: Multiplanar, multiecho pulse sequences of the brain and surrounding structures were obtained without intravenous contrast. Angiographic images of the Circle of Willis were obtained using MRA technique without intravenous contrast. Angiographic images of the neck were obtained using MRA technique without intravenous contrast. Carotid stenosis measurements (when applicable) are obtained utilizing NASCET criteria, using the distal internal carotid diameter as the denominator. COMPARISON:  Prior CT from 12/19/2020. FINDINGS: MRI HEAD FINDINGS Brain: Diffuse prominence of the CSF containing spaces compatible with generalized age-related cerebral atrophy. Patchy T2/FLAIR hyperintensity within the periventricular and deep white matter both cerebral hemispheres consistent with chronic small vessel ischemic disease, mild in nature. Patchy involvement of the pons noted. Superimposed remote hemorrhagic lacunar infarct present at the  left basal ganglia. Few additional small remote left basal ganglia and left thalamic lacunar infarcts noted. 9 mm focus of mild diffusion signal  abnormality seen involving the subcortical and deep white matter of the posterior left frontal centrum semi ovale (series 5, image 94). No discernible associated ADC correlate. Finding likely reflects a small focus of subacute small vessel ischemia. No associated hemorrhage or mass effect. No other diffusion abnormality to suggest acute or subacute ischemia. Gray-white matter differentiation maintained. No other areas of remote cortical infarction. No other foci of susceptibility artifact to suggest acute or chronic intracranial hemorrhage. Well-circumscribed cystic lesion measuring 7.3 x 5.0 x 6.9 cm seen at the left frontotemporal region. This follows CSF signal intensity on all pulse sequences, consistent with a benign arachnoid cyst. Associated mild regional mass effect with trace 2-3 mm of left-to-right shift. No hydrocephalus or ventricular trapping. No other mass lesion or mass effect. No extra-axial fluid collection. Pituitary gland and suprasellar region normal. Midline structures intact. Vascular: Major intracranial vascular flow voids are maintained. Skull and upper cervical spine: Craniocervical junction within normal limits. Bone marrow signal intensity normal. No scalp soft tissue abnormality. Sinuses/Orbits: Globes and orbital soft tissues within normal limits. Scattered mucosal thickening noted within the ethmoidal air cells and maxillary sinuses. Paranasal sinuses are otherwise clear. Trace fluid signal intensity noted within the mastoid air cells bilaterally, of doubtful significance. Inner ear structures grossly within normal limits. Other: None. MRA HEAD FINDINGS ANTERIOR CIRCULATION: Visualized distal cervical segments of the internal carotid arteries are widely patent with antegrade flow. Petrous, cavernous, and supraclinoid segments widely patent without stenosis or other abnormality. A1 segments patent bilaterally. Normal anterior communicating artery complex. Anterior cerebral arteries  widely patent to their distal aspects. No M1 stenosis or occlusion. Normal MCA bifurcations. Distal MCA branches well perfused bilaterally. Mass effect with superior displacement of the left MCA by the large left-sided arachnoid cyst noted. POSTERIOR CIRCULATION: Both V4 segments widely patent to the vertebrobasilar junction. Both PICA origins patent and normal. Basilar widely patent to its distal aspect. Superior cerebellar arteries patent bilaterally. Both PCAs supplied via the basilar well perfused to their distal aspects. No intracranial aneurysm. MRA NECK FINDINGS AORTIC ARCH: Examination technically limited by lack of IV contrast. Visualized aortic arch of normal caliber with normal 3 vessel morphology. No hemodynamically significant stenosis seen about the origin of the great vessels. RIGHT CAROTID SYSTEM: Right CCA patent from its origin to the bifurcation without stenosis. Mild for age atheromatous irregularity about the right bifurcation/proximal right ICA without hemodynamically significant stenosis. Right ICA widely patent distally without stenosis, evidence for dissection, or occlusion. LEFT CAROTID SYSTEM: Left CCA patent from its origin to the bifurcation without stenosis. Mild for age atheromatous irregularity about the left bifurcation/proximal left ICA without significant stenosis. Left ICA patent distally without stenosis, evidence for dissection, or occlusion. VERTEBRAL ARTERIES: Both vertebral arteries appear to arise from the subclavian arteries. No visible proximal subclavian artery stenosis. Proximal left V1 segment not well seen due to tortuosity and lack of IV contrast. Visualized portions of the vertebral arteries are patent within the neck without stenosis, evidence for dissection or occlusion. IMPRESSION: MRI HEAD IMPRESSION: 1. 9 mm focus of mild diffusion abnormality involving the subcortical/deep white matter of the posterior left frontal centrum semi ovale, most likely reflecting a  small focus of subacute small vessel ischemia. No associated hemorrhage or mass effect. 2. No other acute intracranial abnormality. 3. Underlying age-related cerebral atrophy with mild chronic small vessel ischemic disease, with  remote lacunar infarcts involving the left basal ganglia and left thalamus. 4. 7.3 x 5.0 x 6.9 cm benign arachnoid cyst at the left frontotemporal region with associated regional mass effect and trace 2-3 mm of left-to-right shift. MRA HEAD IMPRESSION: Negative intracranial MRA. No large vessel occlusion. No hemodynamically significant or correctable stenosis. MRA NECK IMPRESSION: Negative MRA of the neck. No hemodynamically significant or critical flow limiting stenosis within the neck. Electronically Signed   By: Jeannine Boga M.D.   On: 12/20/2020 03:35   DG Chest Portable 1 View  Result Date: 12/19/2020 CLINICAL DATA:  Initial evaluation for acute dysphagia. EXAM: PORTABLE CHEST 1 VIEW COMPARISON:  Prior radiograph from 11/02/2016. FINDINGS: Exaggeration of the cardiac silhouette related to AP technique and shallow lung inflation. Mediastinal silhouette normal. Tortuosity the intrathoracic aorta noted. Slight left-to-right deviation of the tracheal air column at the level of the aortic knob, similar to previous. No appreciable esophageal dilatation. Lungs are hypoinflated. Streaky and linear opacities at the right greater than left lung bases felt to be most consistent with atelectasis. No other focal infiltrates. No pulmonary edema or visible pleural effusion. Left costophrenic angle incompletely visualized. No pneumothorax. No acute osseous finding.  Osteopenia noted. IMPRESSION: 1. Shallow lung inflation with associated bibasilar atelectasis, right greater than left. 2. No other active cardiopulmonary disease. Electronically Signed   By: Jeannine Boga M.D.   On: 12/19/2020 19:39   ECHOCARDIOGRAM LIMITED BUBBLE STUDY  Result Date: 12/21/2020    ECHOCARDIOGRAM  LIMITED REPORT   Patient Name:   Wayne Walls Date of Exam: 12/21/2020 Medical Rec #:  [id number]       Height:       73.0 in Accession #:    [id number]      Weight:       191.0 lb Date of Birth:  Dec 11, 1932        BSA:          2.110 m Patient Age:    35 years        BP:           112/66 mmHg Patient Gender: M               HR:           65 bpm. Exam Location:  Inpatient Procedure: Limited Echo, Limited Color Doppler and Cardiac Doppler Indications:    stroke 434.91  History:        Patient has no prior history of Echocardiogram examinations.                 Covid; Risk Factors:Sleep Apnea.  Sonographer:    Johny Chess Referring Phys: [id number] JUSTIN B HOWERTER  Sonographer Comments: Image acquisition challenging due to uncooperative patient and Image acquisition challenging due to respiratory motion. IMPRESSIONS  1. Left ventricular ejection fraction, by estimation, is 50 to 55%. The left ventricle has low normal function. Left ventricular diastolic parameters are consistent with Grade I diastolic dysfunction (impaired relaxation).  2. There is a small (1.1 x 0.4 cm) mobile density on the aortic valve passing between the LVOT and aorta. It appears to be on the left coronary cusp. Recommend TEE to better evaluate. The aortic valve is tricuspid. Aortic valve regurgitation is moderate.  3. There is normal pulmonary artery systolic pressure.  4. The inferior vena cava is normal in size with <50% respiratory variability, suggesting right atrial pressure of 8 mmHg.  5. Agitated saline contrast bubble study was negative, with no evidence  of any interatrial shunt. FINDINGS  Left Ventricle: Left ventricular ejection fraction, by estimation, is 50 to 55%. The left ventricle has low normal function. Left ventricular diastolic parameters are consistent with Grade I diastolic dysfunction (impaired relaxation). Normal left ventricular filling pressure. Right Ventricle: There is normal pulmonary artery systolic pressure.  The tricuspid regurgitant velocity is 1.95 m/s, and with an assumed right atrial pressure of 8 mmHg, the estimated right ventricular systolic pressure is 123XX123 mmHg. Tricuspid Valve: Tricuspid valve regurgitation is trivial. Aortic Valve: There is a small (1.1 x 0.4 cm) mobile density on the aortic valve passing between the LVOT and aorta. It appears to be on the left coronary cusp. Recommend TEE to better evaluate. The aortic valve is tricuspid. Aortic valve regurgitation is moderate. Aortic regurgitation PHT measures 320 msec. Pulmonic Valve: Pulmonic valve regurgitation is mild. Venous: The inferior vena cava is normal in size with less than 50% respiratory variability, suggesting right atrial pressure of 8 mmHg. IAS/Shunts: Agitated saline contrast was given intravenously to evaluate for intracardiac shunting. Agitated saline contrast bubble study was negative, with no evidence of any interatrial shunt. LEFT VENTRICLE PLAX 2D LVIDd:         5.10 cm  Diastology LVIDs:         3.80 cm  LV e' medial:   6.74 cm/s LV PW:         0.90 cm  LV E/e' medial: 8.3 LV IVS:        1.00 cm LVOT diam:     2.30 cm LVOT Area:     4.15 cm  IVC IVC diam: 1.80 cm LEFT ATRIUM         Index LA diam:    4.60 cm 2.18 cm/m  AORTIC VALVE AI PHT:      320 msec  AORTA Ao Asc diam: 3.60 cm MITRAL VALVE               TRICUSPID VALVE MV Area (PHT): 2.83 cm    TR Peak grad:   15.2 mmHg MV Decel Time: 268 msec    TR Vmax:        195.00 cm/s MV E velocity: 55.70 cm/s MV A velocity: 73.30 cm/s  SHUNTS MV E/A ratio:  0.76        Systemic Diam: 2.30 cm Skeet Latch MD Electronically signed by Skeet Latch MD Signature Date/Time: 12/21/2020/6:09:09 PM    Final      PERTINENT LAB RESULTS: CBC: Recent Labs    12/22/20 0029 12/23/20 0023  WBC 4.5 5.5  HGB 17.3* 16.0  HCT 49.6 45.0  PLT 128* 125*   CMET CMP     Component Value Date/Time   NA 138 12/23/2020 0023   K 3.6 12/23/2020 0023   CL 106 12/23/2020 0023   CO2 21 (L)  12/23/2020 0023   GLUCOSE 130 (H) 12/23/2020 0023   BUN 24 (H) 12/23/2020 0023   CREATININE 0.90 12/23/2020 0023   CALCIUM 8.1 (L) 12/23/2020 0023   PROT 5.5 (L) 12/23/2020 0023   ALBUMIN 3.0 (L) 12/23/2020 0023   AST 51 (H) 12/23/2020 0023   ALT 39 12/23/2020 0023   ALKPHOS 93 12/23/2020 0023   BILITOT 0.8 12/23/2020 0023   GFRNONAA >60 12/23/2020 0023   GFRAA >60 06/08/2016 1822    GFR Estimated Creatinine Clearance: 67.8 mL/min (by C-G formula based on SCr of 0.9 mg/dL). No results for input(s): LIPASE, AMYLASE in the last 72 hours. No results for input(s): CKTOTAL, CKMB, CKMBINDEX,  TROPONINI in the last 72 hours. Invalid input(s): POCBNP Recent Labs    12/22/20 0029 12/23/20 0023  DDIMER 1.02* 0.41   No results for input(s): HGBA1C in the last 72 hours. No results for input(s): CHOL, HDL, LDLCALC, TRIG, CHOLHDL, LDLDIRECT in the last 72 hours. No results for input(s): TSH, T4TOTAL, T3FREE, THYROIDAB in the last 72 hours.  Invalid input(s): FREET3 No results for input(s): VITAMINB12, FOLATE, FERRITIN, TIBC, IRON, RETICCTPCT in the last 72 hours. Coags: No results for input(s): INR in the last 72 hours.  Invalid input(s): PT Microbiology: Recent Results (from the past 240 hour(s))  SARS CORONAVIRUS 2 (TAT 6-24 HRS) Nasopharyngeal Nasopharyngeal Swab     Status: Abnormal   Collection Time: 12/19/20  8:00 PM   Specimen: Nasopharyngeal Swab  Result Value Ref Range Status   SARS Coronavirus 2 POSITIVE (A) NEGATIVE Final    Comment: (NOTE) SARS-CoV-2 target nucleic acids are DETECTED.  The SARS-CoV-2 RNA is generally detectable in upper and lower respiratory specimens during the acute phase of infection. Positive results are indicative of the presence of SARS-CoV-2 RNA. Clinical correlation with patient history and other diagnostic information is  necessary to determine patient infection status. Positive results do not rule out bacterial infection or co-infection with  other viruses.  The expected result is Negative.  Fact Sheet for Patients: SugarRoll.be  Fact Sheet for Healthcare Providers: https://www.woods-mathews.com/  This test is not yet approved or cleared by the [place] FDA and  has been authorized for detection and/or diagnosis of SARS-CoV-2 by FDA under an Emergency Use Authorization (EUA). This EUA will remain  in effect (meaning this test can be used) for the duration of the COVID-19 declaration under Section 564(b)(1) of the Act, 21 U. S.C. section 360bbb-3(b)(1), unless the authorization is terminated or revoked sooner.   Performed at [facility], [place] [address], [place], [place] [address]     FURTHER DISCHARGE INSTRUCTIONS:  Get Medicines reviewed and adjusted: Please take all your medications with you for your next visit with your Primary MD  Laboratory/radiological data: Please request your Primary MD to go over all hospital tests and procedure/radiological results at the follow up, please ask your Primary MD to get all Hospital records sent to his/her office.  In some cases, they will be blood work, cultures and biopsy results pending at the time of your discharge. Please request that your primary care M.D. goes through all the records of your hospital data and follows up on these results.  Also Note the following: If you experience worsening of your admission symptoms, develop shortness of breath, life threatening emergency, suicidal or homicidal thoughts you must seek medical attention immediately by calling 911 or calling your MD immediately  if symptoms less severe.  You must read complete instructions/literature along with all the possible adverse reactions/side effects for all the Medicines you take and that have been prescribed to you. Take any new Medicines after you have completely understood and accpet all the possible adverse reactions/side effects.   Do not  drive when taking Pain medications or sleeping medications (Benzodaizepines)  Do not take more than prescribed Pain, Sleep and Anxiety Medications. It is not advisable to combine anxiety,sleep and pain medications without talking with your primary care practitioner  Special Instructions: If you have smoked or chewed Tobacco  in the last 2 yrs please stop smoking, stop any regular Alcohol  and or any Recreational drug use.  Wear Seat belts while driving.  Please note: You  were cared for by a hospitalist during your hospital stay. Once you are discharged, your primary care physician will handle any further medical issues. Please note that NO REFILLS for any discharge medications will be authorized once you are discharged, as it is imperative that you return to your primary care physician (or establish a relationship with a primary care physician if you do not have one) for your post hospital discharge needs so that they can reassess your need for medications and monitor your lab values.  Total Time spent coordinating discharge including counseling, education and face to face time equals 35 minutes.  SignedOren Binet 12/23/2020 10:56 AM

## 2020-12-28 ENCOUNTER — Other Ambulatory Visit: Payer: Self-pay

## 2020-12-28 NOTE — Patient Outreach (Signed)
[facility]) Care Management  12/28/2020  Wayne Walls 1933/04/10 [id number]   EMMI- Stroke RED ON EMMI ALERT Day #  3 Date: 12/27/20 Red Alert Reason: Questions /problems with meds  Outreach attempt: Spoke with spouse Wayne Walls. She states that patient is weak and not eating much.  She states patient to be evaluated by [facility] for primary care services and hospice as well. Discussed red alert. She does not remember taking the calls but does not have problems with medications at this time. Patient has hired help in the home to assist at this time.  Wife denies any further needs at this time.   Plan: RN CM will close case.    Jone Baseman, RN, MSN [facility] Care Management Care Management Coordinator Direct Line [phone] Toll Free: [phone]  Fax: [phone]

## 2021-01-03 ENCOUNTER — Other Ambulatory Visit: Payer: Self-pay

## 2021-01-03 NOTE — Patient Outreach (Signed)
[facility]) Care Management  01/03/2021  Wayne Walls June 24, 1933 [id number]   EMMI- stroke RED ON EMMI ALERT Day # 9 Date: 01/02/21 Red Alert Reason:  Lost interest in things? Yes  Sad, hopeless, anxious, or empty? Yes    Outreach attempt: spoke with wife.  She reports that patient remains weak and that home health has done an evaluation but trying to set up more visits but due to therapist getting [facility] staffing has been challenging. Discussed red alerts with her.  She states with patient's dementia he has lost interest in things more but he is currently not on depression medications due to other medications.  Advised her that if depression really worsening in patient to discussed with physician. Patient is now with [facility] and had a virtual visit on Friday. Wife voices no concerns and needed to end call.    Plan: RN CM will close case at this time.    Jone Baseman, RN, MSN [facility] Care Management Care Management Coordinator Direct Line [phone] Toll Free: [phone]  Fax: [phone]

## 2021-01-09 ENCOUNTER — Other Ambulatory Visit: Payer: Self-pay

## 2021-01-09 NOTE — Patient Outreach (Signed)
[facility]) Care Management  01/09/2021  GALEN RUSSMAN 03-Mar-1933 [id number]   EMMI- stroke RED ON EMMI ALERT Day # 13 Date: 01/06/21 Red Alert Reason: Had follow up appoiment? No Scheduled a follow up appointment? no  No outreach needed at this time. Spoke with spouse on 01-03-21 and she stated that patient has a virtual follow up appointment with new physician.     Plan: RN CM will close case.   Jone Baseman, RN, MSN [facility] Care Management Care Management Coordinator Direct Line [phone] Toll Free: [phone]  Fax: [phone]

## 2021-02-03 ENCOUNTER — Ambulatory Visit: Payer: BLUE CROSS/BLUE SHIELD | Admitting: Internal Medicine

## 2021-02-08 ENCOUNTER — Telehealth: Payer: Self-pay | Admitting: Internal Medicine

## 2021-02-08 NOTE — Telephone Encounter (Signed)
Sounds good thanks

## 2021-02-08 NOTE — Telephone Encounter (Signed)
RN returned call to patients wife after following up with Dr. Gasper Sells. Wife feels that the patient is doing well and that a visit is not necessary at this time. Wife states they continue to check blood pressure and heart rate and everything is normal. RN encouraged patients wife to contact the office with any needs or concerns. Wife thanked Therapist, sports for the call.

## 2021-02-08 NOTE — Telephone Encounter (Signed)
Their health is our priority so we will certainly work with them.  I would be more than happy to do a Video Visit with them to discuss everything from the hospital.  I had planned on conservative therapy; so if they would prefer to not follow up that would be reasonable.  Either of those options would be fine; will go with patient preference.  Thanks,  Werner Lean, MD

## 2021-02-08 NOTE — Telephone Encounter (Signed)
Wayne Walls was calling to reschedule her husbands missed appointment with Dr. Lyda Kalata, but requested a call from the nurse before rescheduling. Please advise.

## 2021-02-08 NOTE — Telephone Encounter (Signed)
RN returned call to patients wife regarding appointment. Wife states that patients dementia is worsening and he is unable to leave the house to make it to an appointment. Wife is asking if Dr. Gasper Sells was able to do a virtual/telephone visit in order to complete follow-up visit. RN instructed I would send a message to Dr. Gasper Sells for advice and would return her call. Wife verbalized understanding.

## 2021-04-17 ENCOUNTER — Encounter: Payer: Self-pay | Admitting: Podiatry

## 2021-04-17 ENCOUNTER — Ambulatory Visit (INDEPENDENT_AMBULATORY_CARE_PROVIDER_SITE_OTHER): Payer: Medicare HMO | Admitting: Podiatry

## 2021-04-17 ENCOUNTER — Other Ambulatory Visit: Payer: Self-pay

## 2021-04-17 DIAGNOSIS — B351 Tinea unguium: Secondary | ICD-10-CM | POA: Diagnosis not present

## 2021-04-17 DIAGNOSIS — M79675 Pain in left toe(s): Secondary | ICD-10-CM

## 2021-04-17 DIAGNOSIS — M79674 Pain in right toe(s): Secondary | ICD-10-CM

## 2021-04-19 NOTE — Progress Notes (Signed)
Subjective:   Patient ID: Wayne Walls, male   DOB: 87 y.o.   MRN: [id number]   HPI 87 year old male presents the office with his wife for concerns of his nails becoming thickened discolored and particularly his right great nail is thickened discolored and his wife is concerned that it could cause more issues.  Denies recent injury.  No swelling or redness or any drainage to the toenail sites.  He has no other concerns today.   Review of Systems  All other systems reviewed and are negative.  Past Medical History:  Diagnosis Date  . COPD (chronic obstructive pulmonary disease) ([place])   . Dementia ([place])   . Depression   . Osteoarthrosis, unspecified whether generalized or localized, unspecified site   . Prostate cancer ([place])   . Syncope    Carotids, cardiology  . Unspecified glaucoma(365.9)   . Unspecified sleep apnea    Chronic    Past Surgical History:  Procedure Laterality Date  . HERNIA REPAIR       Current Outpatient Medications:  .  albuterol (VENTOLIN HFA) 108 (90 Base) MCG/ACT inhaler, Inhale 1-2 puffs into the lungs every 4 (four) hours as needed for wheezing or shortness of breath., Disp: 6.7 g, Rfl: 0 .  atorvastatin (LIPITOR) 20 MG tablet, Take 1 tablet (20 mg total) by mouth daily., Disp: 30 tablet, Rfl: 0 .  clopidogrel (PLAVIX) 75 MG tablet, Take 1 tablet (75 mg total) by mouth daily., Disp: 30 tablet, Rfl: 0 .  donepezil (ARICEPT) 5 MG tablet, Take 5 mg by mouth at bedtime., Disp: , Rfl: 3 .  hydrocortisone 2.5 % cream, Apply 1 application topically daily., Disp: , Rfl:  .  ketoconazole (NIZORAL) 2 % cream, Apply 1 application topically daily as needed., Disp: , Rfl:  .  Lactobacillus Rhamnosus, GG, (CULTURELLE) CAPS, Take by mouth., Disp: , Rfl:  .  latanoprost (XALATAN) 0.005 % ophthalmic solution, Place 1 drop into the left eye at bedtime. As directed, Disp: , Rfl:  .  memantine (NAMENDA XR) 28 MG CP24 24 hr capsule, Take 28 mg by mouth daily., Disp: , Rfl:   .  pantoprazole (PROTONIX) 40 MG tablet, Take 1 tablet (40 mg total) by mouth daily., Disp: 30 tablet, Rfl: 0 .  polyethylene glycol (MIRALAX) packet, Take 17 g by mouth daily. (Patient not taking: Reported on 12/19/2020), Disp: 14 each, Rfl: 0 .  potassium chloride (K-DUR) 10 MEQ tablet, TK 1 T PO D, Disp: , Rfl:  .  Sennosides (SENNA) 8.6 MG CAPS, Take 1 capsule by mouth at bedtime., Disp: , Rfl:   No Known Allergies        Objective:  Physical Exam  General: NAD  Dermatological: Nails are hypertrophic, dystrophic, brittle, discolored, elongated 10. No surrounding redness or drainage.  In particular the right hallux nail is almost hypertrophic but appears to be well attached to the underlying nail bed.  Tenderness nails 1-5 bilaterally. No open lesions or pre-ulcerative lesions are identified today.  Vascular: Dorsalis Pedis artery and Posterior Tibial artery pedal pulses are 2/4 bilateral with immedate capillary fill time. There is no pain with calf compression, swelling, warmth, erythema.   Neruologic: Grossly intact via light touch bilateral.  Musculoskeletal: Muscular strength 4/5 in all groups tested bilateral.      Assessment:   Symptomatic onychomycosis     Plan:  -Treatment options discussed including all alternatives, risks, and complications -Etiology of symptoms were discussed -Nails debrided 10 without complications or bleeding. -Daily foot  inspection -Follow-up in 3 months or sooner if any problems arise. In the meantime, encouraged to call the office with any questions, concerns, change in symptoms.   Celesta Gentile, DPM

## 2021-08-24 ENCOUNTER — Encounter: Payer: Self-pay | Admitting: Neurology

## 2021-08-24 ENCOUNTER — Ambulatory Visit: Payer: Medicare HMO | Admitting: Neurology

## 2021-08-24 ENCOUNTER — Other Ambulatory Visit: Payer: Self-pay

## 2021-08-24 VITALS — BP 106/66 | HR 70 | Ht 73.0 in | Wt 188.0 lb

## 2021-08-24 DIAGNOSIS — R269 Unspecified abnormalities of gait and mobility: Secondary | ICD-10-CM

## 2021-08-24 DIAGNOSIS — F0390 Unspecified dementia without behavioral disturbance: Secondary | ICD-10-CM | POA: Insufficient documentation

## 2021-08-24 NOTE — Progress Notes (Signed)
Chief Complaint  Patient presents with   New Patient (Initial Visit)    Rm 16, with wife, states PCP wanted pt to be seen for hx of CVA , c/o fatigue , wife states pt is only taking Lipitor       ASSESSMENT AND PLAN  Wayne Walls is a 87 y.o. male   Dementia  MoCA examination 10/30  MRI of the brain January 2022 showed large size left frontal temporal region arachnoid cyst, with mild regional mass-effect, 9 mm acute infarction involving left frontal centrum semiovale, age-related atrophy, mild supratentorium small vessel disease, evidence of old stroke involving left basal ganglia and left thalamus  Laboratory evaluation B12, RPR to rule out treatable etiology for memory loss, normal TSH, CBC CMP   Gait abnormality  Referral to physical therapy   DIAGNOSTIC DATA (LABS, IMAGING, TESTING) - I reviewed patient records, labs, notes, testing and imaging myself where available. MRI of brain on Dec 20 2020: 1. 9 mm focus of mild diffusion abnormality involving the subcortical/deep white matter of the posterior left frontal centrum semi ovale, most likely reflecting a small focus of subacute small vessel ischemia. No associated hemorrhage or mass effect. 2. No other acute intracranial abnormality. 3. 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 4. 7.3 x 5.0 x 6.9 cm benign arachnoid cyst at the left frontotemporal region with associated regional mass effect and trace 2-3 mm of left-to-right shift.  MRA of brain and neck showed no large vessel disease  Laboratory evaluation showed C-reactive protein of 1.0, CMP, creatinine of 0.9, calcium of 8.1, CBC hemoglobin of 16.0,   MEDICAL HISTORY:  Wayne Walls is a 87 year old male, seen in request by his primary care nurse practitioner Sonia Side., for evaluation of memory loss, gait abnormality,  initial evaluation was on August 24, 2021  I reviewed and summarized the referring note. PMHx. HLD COPD Previous smoker  He was admitted to hospital in January 2022 for frequent fall, right facial droop, personally reviewed MRI of the brain, showed 9 mm DWI lesion at the left frontal centrum semiovale, in addition, there was benign arachnoid cyst at the left frontotemporal region with associated regional mass-effect, mild age-related atrophy, evidence of old stroke involving left basal ganglia and left thalamus  During hospital stay, also developed COVID virus pneumonia, was treated with remdesivir, tapering dose of steroid, TTE also find mobile density on the aortic valve, concern for myxoma or fibroblastoma,  He was taking aspirin 81 mg daily, discharged home with Plavix 75 mg daily, but was advised by his primary care to stop antiplatelet agent,  Is a very successful broker, developed a commercial property, apartment, was noted to have gradual onset memory loss since 2018, now lives at home with his wife, have daily help her come in, fair appetite, good sleep, sedentary, spends most of the time watching TV,  He also has gait abnormality, but can ambulate without assistant,  Family history of dementia, older brother died of dementia at age 66s, sister has brain tumor, also had memory loss later stage  PHYSICAL EXAM:   Vitals:   08/24/21 1406  BP: 106/66  Pulse: 70  Weight: 188 lb (85.3 kg)  Height: 6\' 1"  (1.854 m)   Not recorded     Body mass index is 24.8 kg/m.  PHYSICAL EXAMNIATION:  Gen: NAD, conversant, well nourised, well groomed  Cardiovascular: Regular rate rhythm, no peripheral edema, warm, nontender. Eyes: Conjunctivae clear without exudates or hemorrhage Neck: Supple, no carotid bruits. Pulmonary: Clear to auscultation bilaterally   NEUROLOGICAL EXAM:  MENTAL STATUS: Speech:    Speech is normal; fluent and spontaneous with normal  comprehension.  Cognition:     Orientation to time, place and person     Normal recent and remote memory     Normal Attention span and concentration     Normal Language, naming, repeating,spontaneous speech     Fund of knowledge   CRANIAL NERVES: CN II: Visual fields are full to confrontation. Pupils are round equal and briskly reactive to light. CN III, IV, VI:  No ptosis. Vertical gaze palsy. CN V: Facial sensation is intact to light touch CN VII: Face is symmetric with normal eye closure  CN VIII: Hearing is normal to causal conversation. CN IX, X: Phonation is normal. CN XI: Head turning and shoulder shrug are intact  MOTOR: There is no pronator drift of out-stretched arms. Muscle bulk and tone are normal. Muscle strength is normal.  REFLEXES: Reflexes are 2+ and symmetric at the biceps, triceps, knees, and ankles. Plantar responses are flexor.  SENSORY: Intact to light touch, pinprick and vibratory sensation are intact in fingers and toes.  COORDINATION: There is no trunk or limb dysmetria noted.  GAIT/STANCE: Posture is normal. Gait is steady with normal steps, base, arm swing, and turning. Heel and toe walking are normal. Tandem gait is normal.  Romberg is absent.  REVIEW OF SYSTEMS:  Full 14 system review of systems performed and notable only for as above All other review of systems were negative.   ALLERGIES: No Known Allergies  HOME MEDICATIONS: Current Outpatient Medications  Medication Sig Dispense Refill   atorvastatin (LIPITOR) 20 MG tablet Take 1 tablet (20 mg total) by mouth daily. 30 tablet 0   polyethylene glycol (MIRALAX) packet Take 17 g by mouth daily. (Patient not taking: Reported on 12/19/2020) 14 each 0   potassium chloride (K-DUR) 10 MEQ tablet TK 1 T PO D     Sennosides (SENNA) 8.6 MG CAPS Take 1 capsule by mouth at bedtime.     No current facility-administered medications for this visit.    PAST MEDICAL HISTORY: Past Medical History:   Diagnosis Date   COPD (chronic obstructive pulmonary disease) ([facility])    Dementia ([facility])    Depression    Osteoarthrosis, unspecified whether generalized or localized, unspecified site    Prostate cancer ([place])    Syncope    Carotids, cardiology   Unspecified glaucoma(365.9)    Unspecified sleep apnea    Chronic    PAST SURGICAL HISTORY: Past Surgical History:  Procedure Laterality Date   HERNIA REPAIR      FAMILY HISTORY: Family History  Problem Relation Age of Onset   Heart disease Mother    Hypertension Mother    Breast cancer Sister    Prostate cancer Neg Hx    Colon cancer Neg Hx    Pancreatic cancer Neg Hx     SOCIAL HISTORY: Social History   Socioeconomic History   Marital status: Married    Spouse name: Not on file   Number of children: 2   Years of education: Not on file   Highest education level: Not on file  Occupational History    Comment: retired  Tobacco Use   Smoking status: Never   Smokeless tobacco: Never  Vaping Use   Vaping Use: Never used  Substance  and Sexual Activity   Alcohol use: Not Currently   Drug use: Never   Sexual activity: Not Currently  Other Topics Concern   Not on file  Social History Narrative   Second marriage. Has two grown children. One child resides in [place].    Social Determinants of Health   Financial Resource Strain: Not on file  Food Insecurity: Not on file  Transportation Needs: Not on file  Physical Activity: Not on file  Stress: Not on file  Social Connections: Not on file  Intimate Partner Violence: Not on file      Marcial Pacas, M.D. Ph.D.  [facility] [address], [place], [place] [address] Ph: [phone] Fax: [phone]  CC:  Sonia Side., [place],  [place] [address]  Sonia Side., FNP

## 2021-08-25 LAB — RPR: RPR Ser Ql: NONREACTIVE

## 2021-08-25 LAB — VITAMIN B12: Vitamin B-12: 399 pg/mL (ref 232–1245)

## 2021-08-28 ENCOUNTER — Telehealth: Payer: Self-pay | Admitting: Neurology

## 2021-08-28 NOTE — Telephone Encounter (Signed)
Please call patient for no result, low normal range B12, he would benefit B12 supplement 1000 mcg daily

## 2021-08-28 NOTE — Telephone Encounter (Signed)
I spoke to his wife on [place]. She verbalized understanding of the lab findings. She will start him on the recommended B12 supplement.

## 2021-08-28 NOTE — Telephone Encounter (Signed)
Left message for a return call

## 2024-04-24 ENCOUNTER — Emergency Department (HOSPITAL_COMMUNITY)

## 2024-04-24 ENCOUNTER — Encounter (HOSPITAL_COMMUNITY): Payer: Self-pay | Admitting: Anesthesiology

## 2024-04-24 ENCOUNTER — Inpatient Hospital Stay (HOSPITAL_COMMUNITY)
Admission: EM | Admit: 2024-04-24 | Discharge: 2024-05-05 | DRG: 521 | Disposition: A | Discharge status: Hospice | Attending: Internal Medicine | Admitting: Internal Medicine

## 2024-04-24 ENCOUNTER — Other Ambulatory Visit: Payer: Self-pay

## 2024-04-24 ENCOUNTER — Encounter (HOSPITAL_COMMUNITY): Payer: Self-pay | Admitting: Emergency Medicine

## 2024-04-24 DIAGNOSIS — S72002A Fracture of unspecified part of neck of left femur, initial encounter for closed fracture: Principal | ICD-10-CM | POA: Diagnosis present

## 2024-04-24 DIAGNOSIS — J449 Chronic obstructive pulmonary disease, unspecified: Secondary | ICD-10-CM | POA: Diagnosis present

## 2024-04-24 DIAGNOSIS — L89151 Pressure ulcer of sacral region, stage 1: Secondary | ICD-10-CM | POA: Diagnosis present

## 2024-04-24 DIAGNOSIS — G4733 Obstructive sleep apnea (adult) (pediatric): Secondary | ICD-10-CM | POA: Diagnosis present

## 2024-04-24 DIAGNOSIS — F0280 Dementia in other diseases classified elsewhere without behavioral disturbance: Secondary | ICD-10-CM | POA: Diagnosis present

## 2024-04-24 DIAGNOSIS — F339 Major depressive disorder, recurrent, unspecified: Secondary | ICD-10-CM | POA: Diagnosis not present

## 2024-04-24 DIAGNOSIS — G309 Alzheimer's disease, unspecified: Secondary | ICD-10-CM | POA: Diagnosis present

## 2024-04-24 DIAGNOSIS — R4589 Other symptoms and signs involving emotional state: Secondary | ICD-10-CM | POA: Diagnosis not present

## 2024-04-24 DIAGNOSIS — Z8673 Personal history of transient ischemic attack (TIA), and cerebral infarction without residual deficits: Secondary | ICD-10-CM | POA: Diagnosis not present

## 2024-04-24 DIAGNOSIS — F32A Depression, unspecified: Secondary | ICD-10-CM | POA: Diagnosis present

## 2024-04-24 DIAGNOSIS — Z515 Encounter for palliative care: Secondary | ICD-10-CM

## 2024-04-24 DIAGNOSIS — Z751 Person awaiting admission to adequate facility elsewhere: Secondary | ICD-10-CM | POA: Diagnosis not present

## 2024-04-24 DIAGNOSIS — J439 Emphysema, unspecified: Secondary | ICD-10-CM | POA: Diagnosis present

## 2024-04-24 DIAGNOSIS — Z8546 Personal history of malignant neoplasm of prostate: Secondary | ICD-10-CM

## 2024-04-24 DIAGNOSIS — Z6823 Body mass index (BMI) 23.0-23.9, adult: Secondary | ICD-10-CM | POA: Diagnosis not present

## 2024-04-24 DIAGNOSIS — W19XXXA Unspecified fall, initial encounter: Secondary | ICD-10-CM | POA: Diagnosis present

## 2024-04-24 DIAGNOSIS — Z79899 Other long term (current) drug therapy: Secondary | ICD-10-CM

## 2024-04-24 DIAGNOSIS — J690 Pneumonitis due to inhalation of food and vomit: Secondary | ICD-10-CM | POA: Diagnosis present

## 2024-04-24 DIAGNOSIS — S72002S Fracture of unspecified part of neck of left femur, sequela: Secondary | ICD-10-CM | POA: Diagnosis not present

## 2024-04-24 DIAGNOSIS — R296 Repeated falls: Secondary | ICD-10-CM | POA: Diagnosis present

## 2024-04-24 DIAGNOSIS — E860 Dehydration: Secondary | ICD-10-CM | POA: Diagnosis present

## 2024-04-24 DIAGNOSIS — N320 Bladder-neck obstruction: Secondary | ICD-10-CM | POA: Diagnosis present

## 2024-04-24 DIAGNOSIS — H409 Unspecified glaucoma: Secondary | ICD-10-CM | POA: Diagnosis present

## 2024-04-24 DIAGNOSIS — F0150 Vascular dementia without behavioral disturbance: Secondary | ICD-10-CM | POA: Diagnosis present

## 2024-04-24 DIAGNOSIS — R64 Cachexia: Secondary | ICD-10-CM | POA: Diagnosis present

## 2024-04-24 DIAGNOSIS — G9341 Metabolic encephalopathy: Secondary | ICD-10-CM

## 2024-04-24 DIAGNOSIS — Z7189 Other specified counseling: Secondary | ICD-10-CM | POA: Diagnosis not present

## 2024-04-24 DIAGNOSIS — F0390 Unspecified dementia without behavioral disturbance: Secondary | ICD-10-CM | POA: Diagnosis not present

## 2024-04-24 DIAGNOSIS — Z8249 Family history of ischemic heart disease and other diseases of the circulatory system: Secondary | ICD-10-CM | POA: Diagnosis not present

## 2024-04-24 DIAGNOSIS — R410 Disorientation, unspecified: Secondary | ICD-10-CM | POA: Diagnosis not present

## 2024-04-24 DIAGNOSIS — R1310 Dysphagia, unspecified: Secondary | ICD-10-CM

## 2024-04-24 DIAGNOSIS — Z66 Do not resuscitate: Secondary | ICD-10-CM

## 2024-04-24 LAB — CBC WITH DIFFERENTIAL/PLATELET
Abs Immature Granulocytes: 0.09 10*3/uL — ABNORMAL HIGH (ref 0.00–0.07)
Basophils Absolute: 0 10*3/uL (ref 0.0–0.1)
Basophils Relative: 0 %
Eosinophils Absolute: 0 10*3/uL (ref 0.0–0.5)
Eosinophils Relative: 0 %
HCT: 49.3 % (ref 39.0–52.0)
Hemoglobin: 16.2 g/dL (ref 13.0–17.0)
Immature Granulocytes: 1 %
Lymphocytes Relative: 4 %
Lymphs Abs: 0.4 10*3/uL — ABNORMAL LOW (ref 0.7–4.0)
MCH: 33.1 pg (ref 26.0–34.0)
MCHC: 32.9 g/dL (ref 30.0–36.0)
MCV: 100.6 fL — ABNORMAL HIGH (ref 80.0–100.0)
Monocytes Absolute: 0.8 10*3/uL (ref 0.1–1.0)
Monocytes Relative: 6 %
Neutro Abs: 10.6 10*3/uL — ABNORMAL HIGH (ref 1.7–7.7)
Neutrophils Relative %: 89 %
Platelets: 165 10*3/uL (ref 150–400)
RBC: 4.9 MIL/uL (ref 4.22–5.81)
RDW: 13.4 % (ref 11.5–15.5)
WBC: 11.9 10*3/uL — ABNORMAL HIGH (ref 4.0–10.5)
nRBC: 0 % (ref 0.0–0.2)

## 2024-04-24 LAB — BASIC METABOLIC PANEL WITH GFR
Anion gap: 16 — ABNORMAL HIGH (ref 5–15)
BUN: 19 mg/dL (ref 8–23)
CO2: 20 mmol/L — ABNORMAL LOW (ref 22–32)
Calcium: 8.7 mg/dL — ABNORMAL LOW (ref 8.9–10.3)
Chloride: 107 mmol/L (ref 98–111)
Creatinine, Ser: 0.89 mg/dL (ref 0.61–1.24)
GFR, Estimated: >60 mL/min (ref >60)
Glucose, Bld: 122 mg/dL — ABNORMAL HIGH (ref 70–99)
Potassium: 3.5 mmol/L (ref 3.5–5.1)
Sodium: 143 mmol/L (ref 135–145)

## 2024-04-24 LAB — PROTIME-INR
INR: 1.1 (ref 0.8–1.2)
Prothrombin Time: 14.2 s (ref 11.4–15.2)

## 2024-04-24 LAB — TYPE AND SCREEN
ABO/RH(D): O POS
Antibody Screen: NEGATIVE

## 2024-04-24 LAB — ABO/RH: ABO/RH(D): O POS

## 2024-04-24 MED ORDER — NYSTATIN 100000 UNIT/GM EX CREA: 1.0000 | TOPICAL_CREAM | Freq: Two times a day (BID) | CUTANEOUS | Status: DC | PRN

## 2024-04-24 MED ORDER — ALBUTEROL SULFATE (2.5 MG/3ML) 0.083% IN NEBU: 2.5000 mg | INHALATION_SOLUTION | RESPIRATORY_TRACT | Status: DC | PRN

## 2024-04-24 MED ORDER — ZINC OXIDE 40 % EX OINT
TOPICAL_OINTMENT | Freq: Two times a day (BID) | CUTANEOUS | Status: DC
  Administered 2024-04-25 – 2024-05-03 (×8): 1 via TOPICAL
  Filled 2024-04-24: qty 57

## 2024-04-24 MED ORDER — NYSTATIN 100000 UNIT/GM EX CREA
1.0000 | TOPICAL_CREAM | Freq: Two times a day (BID) | CUTANEOUS | Status: DC
Start: 2024-04-24 — End: 2024-04-24

## 2024-04-24 MED ORDER — DONEPEZIL HCL 10 MG PO TABS
5.0000 mg | ORAL_TABLET | Freq: Every day | ORAL | Status: DC
  Administered 2024-04-24 – 2024-05-01 (×6): 5 mg via ORAL
  Filled 2024-04-24 (×8): qty 1

## 2024-04-24 MED ORDER — ACETAMINOPHEN 325 MG PO TABS
650.0000 mg | ORAL_TABLET | Freq: Four times a day (QID) | ORAL | Status: DC | PRN
  Administered 2024-04-24 – 2024-05-01 (×5): 650 mg via ORAL
  Filled 2024-04-24 (×5): qty 2

## 2024-04-24 MED ORDER — OXYCODONE HCL 5 MG PO TABS
2.5000 mg | ORAL_TABLET | ORAL | Status: DC | PRN
  Administered 2024-04-24 (×2): 2.5 mg via ORAL
  Filled 2024-04-24 (×2): qty 1

## 2024-04-24 MED ORDER — ONDANSETRON HCL 4 MG PO TABS: 4.0000 mg | ORAL_TABLET | Freq: Four times a day (QID) | ORAL | Status: DC | PRN

## 2024-04-24 MED ORDER — ACETAMINOPHEN 650 MG RE SUPP
650.0000 mg | Freq: Four times a day (QID) | RECTAL | Status: DC | PRN
Start: 2024-04-24 — End: 2024-05-06

## 2024-04-24 MED ORDER — ONDANSETRON HCL 4 MG/2ML IJ SOLN: 4.0000 mg | Freq: Four times a day (QID) | INTRAMUSCULAR | Status: DC | PRN

## 2024-04-24 MED ORDER — SODIUM CHLORIDE 0.9 % IV SOLN: INTRAVENOUS | Status: AC

## 2024-04-24 MED ORDER — ENOXAPARIN SODIUM 40 MG/0.4ML IJ SOSY
40.0000 mg | PREFILLED_SYRINGE | INTRAMUSCULAR | Status: DC
  Administered 2024-04-24 – 2024-05-03 (×9): 40 mg via SUBCUTANEOUS
  Filled 2024-04-24 (×9): qty 0.4

## 2024-04-24 MED ORDER — HALOPERIDOL 0.5 MG PO TABS
0.5000 mg | ORAL_TABLET | Freq: Two times a day (BID) | ORAL | Status: DC | PRN
  Filled 2024-04-24: qty 1

## 2024-04-24 NOTE — Consult Note (Signed)
 Reason for Consult:Left hip fx Referring Physician: Deatra Face Time called: 1240 Time at bedside: 1355   Wayne Walls is an 89 y.o. male.  HPI: Haruo fell at the memory care unit where he resides. He c/o hip pain and was brought to the ED where x-rays showed a left hip fx and orthopedic surgery was consulted. He is demented and cannot contribute to history.  Past Medical History:  Diagnosis Date   COPD (chronic obstructive pulmonary disease) (HCC)    Dementia (HCC)    Depression    Osteoarthrosis, unspecified whether generalized or localized, unspecified site    Prostate cancer (HCC)    Syncope    Carotids, cardiology   Unspecified glaucoma(365.9)    Unspecified sleep apnea    Chronic    Past Surgical History:  Procedure Laterality Date   HERNIA REPAIR      Family History  Problem Relation Age of Onset   Heart disease Mother    Hypertension Mother    Breast cancer Sister    Prostate cancer Neg Hx    Colon cancer Neg Hx    Pancreatic cancer Neg Hx     Social History:  reports that he has never smoked. He has never used smokeless tobacco. He reports that he does not currently use alcohol . He reports that he does not use drugs.  Allergies: No Known Allergies  Medications: I have reviewed the patient's current medications.  Results for orders placed or performed during the hospital encounter of 04/24/24 (from the past 48 hours)  Type and screen [facility]     Status: None   Collection Time: 04/24/24 12:38 PM  Result Value Ref Range   ABO/RH(D) O POS    Antibody Screen NEG    Sample Expiration      04/27/2024,2359 Performed at [facility], [address] [place], [place] [address]   Basic metabolic panel     Status: Abnormal   Collection Time: 04/24/24 12:39 PM  Result Value Ref Range   Sodium 143 135 - 145 mmol/L   Potassium 3.5 3.5 - 5.1 mmol/L   Chloride 107 98 - 111 mmol/L   CO2 20 (L) 22 - 32 mmol/L    Glucose, Bld 122 (H) 70 - 99 mg/dL    Comment: Glucose reference range applies only to samples taken after fasting for at least 8 hours.   BUN 19 8 - 23 mg/dL   Creatinine, Ser 8.11 0.61 - 1.24 mg/dL   Calcium  8.7 (L) 8.9 - 10.3 mg/dL   GFR, Estimated >91 >47 mL/min    Comment: (NOTE) Calculated using the CKD-EPI Creatinine Equation (2021)    Anion gap 16 (H) 5 - 15    Comment: [facility], [address], [place], [place] [address]  CBC with Differential     Status: Abnormal   Collection Time: 04/24/24 12:39 PM  Result Value Ref Range   WBC 11.9 (H) 4.0 - 10.5 K/uL   RBC 4.90 4.22 - 5.81 MIL/uL   Hemoglobin 16.2 13.0 - 17.0 g/dL   HCT 21.3 08.6 - 57.8 %   MCV 100.6 (H) 80.0 - 100.0 fL   MCH 33.1 26.0 - 34.0 pg   MCHC 32.9 30.0 - 36.0 g/dL   RDW 46.9 62.9 - 52.8 %   Platelets 165 150 - 400 K/uL   nRBC 0.0 0.0 - 0.2 %   Neutrophils Relative % 89 %   Neutro Abs 10.6 (H)  1.7 - 7.7 K/uL   Lymphocytes Relative 4 %   Lymphs Abs 0.4 (L) 0.7 - 4.0 K/uL   Monocytes Relative 6 %   Monocytes Absolute 0.8 0.1 - 1.0 K/uL   Eosinophils Relative 0 %   Eosinophils Absolute 0.0 0.0 - 0.5 K/uL   Basophils Relative 0 %   Basophils Absolute 0.0 0.0 - 0.1 K/uL   Immature Granulocytes 1 %   Abs Immature Granulocytes 0.09 (H) 0.00 - 0.07 K/uL    Comment: Performed at [facility], [address] [place], [place] [address]  Protime-INR     Status: None   Collection Time: 04/24/24 12:39 PM  Result Value Ref Range   Prothrombin Time 14.2 11.4 - 15.2 seconds   INR 1.1 0.8 - 1.2    Comment: (NOTE) INR goal varies based on device and disease states. Performed at [facility], [address] [place], [place] [address]   ABO/Rh     Status: None   Collection Time: 04/24/24 12:40 PM  Result Value Ref Range   ABO/RH(D)      O POS Performed at [facility], [address] [place], [place] [address]      DG Hip Luis Sailors or Wo Pelvis 2-3 Views Left Result Date: 04/24/2024 CLINICAL DATA:  Hip pain status post fall. EXAM: DG HIP (WITH OR WITHOUT PELVIS) 2-3V LEFT COMPARISON:  None available FINDINGS: Acute, mildly impacted and angulated fracture of the LEFT femoral neck. Hernia repair mesh noted within the pelvis. IMPRESSION: Acute LEFT femoral neck fracture. Electronically Signed   By: Elester Grim M.D.   On: 04/24/2024 12:26   DG Chest 2 View Result Date: 04/24/2024 CLINICAL DATA:  Status post fall off.  RIGHT hip and knee pain. EXAM: CHEST - 2 VIEW COMPARISON:  12/19/2020 FINDINGS: Cardiomediastinal silhouette and pulmonary vasculature are within normal limits. Mild bibasilar atelectasis is present.  Lung are otherwise clear. Severe compression deformity of midthoracic vertebral body is new since prior chest radiograph from 07/06/2008, ultimately of uncertain chronicity. IMPRESSION: 1. No acute cardiopulmonary process. 2. Severe compression deformity of midthoracic vertebral body of uncertain chronicity. Electronically Signed   By: Elester Grim M.D.   On: 04/24/2024 12:25   DG Knee 2 Views Left Result Date: 04/24/2024 CLINICAL DATA:  RIGHT knee pain status post fall EXAM: LEFT KNEE - 1-2 VIEW COMPARISON:  None available FINDINGS: Mild soft tissue swelling overlying the patella. No fracture or dislocation. IMPRESSION: Mild soft tissue swelling overlying the patella without underlying fracture or dislocation. Electronically Signed   By: Elester Grim M.D.   On: 04/24/2024 12:23   CT Head Wo Contrast Result Date: 04/24/2024 CLINICAL DATA:  Minor head trauma EXAM: CT HEAD WITHOUT CONTRAST TECHNIQUE: Contiguous axial images were obtained from the base of the skull through the vertex without intravenous contrast. RADIATION DOSE REDUCTION: This exam was performed according to the departmental dose-optimization program which includes automated exposure control, adjustment of the mA and/or kV according to  patient size and/or use of iterative reconstruction technique. COMPARISON:  Brain MRI 12/20/2020 FINDINGS: Brain: No evidence of acute infarction, hemorrhage, hydrocephalus, extra-axial collection or mass lesion/mass effect. Generalized brain atrophy with chronic small vessel ischemia in the cerebral white matter. 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 Simple cyst centered at the lower left sylvian fissure and middle cranial fossa with mass effect on the adjacent brain, up to 7 cm. Vascular: No hyperdense vessel or unexpected calcification. Skull: Normal. Negative  for fracture or focal lesion. Sinuses/Orbits: No acute finding. IMPRESSION: No acute finding or change since 2022, as described. Electronically Signed   By: Ronnette Coke M.D.   On: 04/24/2024 10:10    Review of Systems  Unable to perform ROS: Dementia   Blood pressure (!) 143/92, pulse 93, temperature (!) 96.8 F (36 C), resp. rate (!) 23, SpO2 (!) 87%. Physical Exam Constitutional:      General: He is not in acute distress.    Appearance: He is well-developed. He is not diaphoretic.  HENT:     Head: Normocephalic and atraumatic.  Eyes:     General: No scleral icterus.       Right eye: No discharge.        Left eye: No discharge.     Conjunctiva/sclera: Conjunctivae normal.  Cardiovascular:     Rate and Rhythm: Normal rate and regular rhythm.  Pulmonary:     Effort: Pulmonary effort is normal. No respiratory distress.  Musculoskeletal:     Cervical back: Normal range of motion.     Comments: LLE No traumatic wounds, ecchymosis, or rash  Mild TTP hip  No knee or ankle effusion  Knee stable to varus/ valgus and anterior/posterior stress  Sens DPN, SPN, TN could not assess  Motor EHL, ext, flex, evers grossly intact  DP 2+, PT 2+, No significant edema  Skin:    General: Skin is warm and dry.  Neurological:     Mental Status: He is alert.  Psychiatric:        Mood and Affect: Mood normal.         Behavior: Behavior normal.     Assessment/Plan: Left hip fx -- Discussed operative vs non-op treatment with son. I think he's leaning towards surgery but needs to talk with family. Will tentatively plan on fixation tomorrow morning with Dr. Lucienne Ryder. Please keep NPO after MN.    Georganna Kin, PA-C Orthopedic Surgery [phone] 04/24/2024, 2:22 PM

## 2024-04-24 NOTE — Consult Note (Signed)
 WOC Nurse Consult Note: Reason for Consult: nonblanchable erythema to sacrum  Wound type: Stage 1 Pressure Injury sacrum  Pressure Injury POA: Yes Measurement: see nursing flowsheet  Wound bed: no open wound  Drainage (amount, consistency, odor) none  Periwound: Dressing procedure/placement/frequency: Cleanse sacrum with soap and water, dry and apply silicone foam to area.  Lift foam daily to assess area and change foam q3 days and prn soiling.   Apply a thin layer of Desitin to buttocks 2 times daily and prn soiling.   POC discussed with bedside nurse.  WOC team will not follow. Re-consult if further needs arise.   Thank you,    Ronni Colace MSN, RN-BC, [employer] [phone]

## 2024-04-24 NOTE — H&P (Signed)
 History and Physical  Wayne Walls GEX:[id number] DOB: [birth year] DOA: 04/24/2024  PCP: Shannan Dart., FNP   Chief Complaint: Left hip fracture  HPI: Wayne Walls is a 89 y.o. male with medical history significant for advanced dementia who was found down at his memory care unit and unfortunately has a left femoral neck fracture.  I was able to speak in person at the bedside with his son [name], who states the patient has been in his usual state of health and in fact yesterday he went on a boat ride with other residents at his facility.  Unfortunately he was found down, with no other history known.  Patient has already been seen in consultation by orthopedic surgery, who is tentatively planning surgical repair in the morning.  Review of Systems: Please see HPI for pertinent positives and negatives. A complete 10 system review of systems are otherwise negative.  Past Medical History:  Diagnosis Date   COPD (chronic obstructive pulmonary disease) (HCC)    Dementia (HCC)    Depression    Osteoarthrosis, unspecified whether generalized or localized, unspecified site    Prostate cancer (HCC)    Syncope    Carotids, cardiology   Unspecified glaucoma(365.9)    Unspecified sleep apnea    Chronic   Past Surgical History:  Procedure Laterality Date   HERNIA REPAIR     Social History:  reports that he has never smoked. He has never used smokeless tobacco. He reports that he does not currently use alcohol . He reports that he does not use drugs.  No Known Allergies  Family History  Problem Relation Age of Onset   Heart disease Mother    Hypertension Mother    Breast cancer Sister    Prostate cancer Neg Hx    Colon cancer Neg Hx    Pancreatic cancer Neg Hx      Prior to Admission medications   Medication Sig Start Date End Date Taking? Authorizing Provider  cetirizine (ZYRTEC) 10 MG tablet Take 10 mg by mouth daily.   Yes [provider]  Cholecalciferol (VITAMIN D-3)  125 MCG (5000 UT) TABS Take 5,000 Units by mouth daily.   Yes [provider]  colestipol (COLESTID) 1 g tablet Take 1 g by mouth 3 (three) times daily as needed (diarrhea).   Yes [provider]  cyanocobalamin (VITAMIN B12) 1000 MCG tablet Take 1,000 mcg by mouth daily.   Yes [provider]  donepezil  (ARICEPT ) 5 MG tablet Take 5 mg by mouth daily.   Yes [provider]  Eyelid Cleansers (OCUSOFT LID SCRUB EX) Place 1 Application into both eyes daily. Both eye lids   Yes [provider]  fluticasone (FLONASE) 50 MCG/ACT nasal spray Place 2 sprays into both nostrils daily.   Yes [provider]  haloperidol (HALDOL) 0.5 MG tablet Take 0.5 mg by mouth 2 (two) times daily as needed for agitation (sundowning).   Yes [provider]  nystatin cream (MYCOSTATIN) Apply 1 Application topically 2 (two) times daily. Apply to bilateral groin  PRN order: 1 application to groin twice daily as needed  for yeast.   Yes [provider]    Physical Exam: BP (!) 143/92 (BP Location: Left Arm)   Pulse 93   Temp (!) 96.8 F (36 C)   Resp (!) 23   SpO2 (!) 87%  General: Alert, disoriented which is his baseline.  His son is at the bedside. Cardiovascular: RRR, no murmurs or rubs,  no peripheral edema  Respiratory: clear to auscultation bilaterally, no wheezes, no crackles  Abdomen: soft, nontender, nondistended, normal bowel tones heard  Skin: dry, no rashes  Musculoskeletal: no joint effusions, limited range of motion due to dementia and fracture          Labs on Admission:  Basic Metabolic Panel: Recent Labs  Lab 04/24/24 1239  NA 143  K 3.5  CL 107  CO2 20*  GLUCOSE 122*  BUN 19  CREATININE 0.89  CALCIUM  8.7*   Liver Function Tests: No results for input(s): "AST", "ALT", "ALKPHOS", "BILITOT", "PROT", "ALBUMIN" in the last 168 hours. No results for input(s): "LIPASE", "AMYLASE" in the last 168 hours. No results for  input(s): "AMMONIA" in the last 168 hours. CBC: Recent Labs  Lab 04/24/24 1239  WBC 11.9*  NEUTROABS 10.6*  HGB 16.2  HCT 49.3  MCV 100.6*  PLT 165   Cardiac Enzymes: No results for input(s): "CKTOTAL", "CKMB", "CKMBINDEX", "TROPONINI" in the last 168 hours. BNP (last 3 results) No results for input(s): "BNP" in the last 8760 hours.  ProBNP (last 3 results) No results for input(s): "PROBNP" in the last 8760 hours.  CBG: No results for input(s): "GLUCAP" in the last 168 hours.  Radiological Exams on Admission: DG Hip Unilat W or Wo Pelvis 2-3 Views Left Result Date: 04/24/2024 CLINICAL DATA:  Hip pain status post fall. EXAM: DG HIP (WITH OR WITHOUT PELVIS) 2-3V LEFT COMPARISON:  None available FINDINGS: Acute, mildly impacted and angulated fracture of the LEFT femoral neck. Hernia repair mesh noted within the pelvis. IMPRESSION: Acute LEFT femoral neck fracture. Electronically Signed   By: Elester Grim M.D.   On: 04/24/2024 12:26   DG Chest 2 View Result Date: 04/24/2024 CLINICAL DATA:  Status post fall off.  RIGHT hip and knee pain. EXAM: CHEST - 2 VIEW COMPARISON:  12/19/2020 FINDINGS: Cardiomediastinal silhouette and pulmonary vasculature are within normal limits. Mild bibasilar atelectasis is present.  Lung are otherwise clear. Severe compression deformity of midthoracic vertebral body is new since prior chest radiograph from 07/06/2008, ultimately of uncertain chronicity. IMPRESSION: 1. No acute cardiopulmonary process. 2. Severe compression deformity of midthoracic vertebral body of uncertain chronicity. Electronically Signed   By: Elester Grim M.D.   On: 04/24/2024 12:25   DG Knee 2 Views Left Result Date: 04/24/2024 CLINICAL DATA:  RIGHT knee pain status post fall EXAM: LEFT KNEE - 1-2 VIEW COMPARISON:  None available FINDINGS: Mild soft tissue swelling overlying the patella. No fracture or dislocation. IMPRESSION: Mild soft tissue swelling overlying the patella without  underlying fracture or dislocation. Electronically Signed   By: Elester Grim M.D.   On: 04/24/2024 12:23   CT Head Wo Contrast Result Date: 04/24/2024 CLINICAL DATA:  Minor head trauma EXAM: CT HEAD WITHOUT CONTRAST TECHNIQUE: Contiguous axial images were obtained from the base of the skull through the vertex without intravenous contrast. RADIATION DOSE REDUCTION: This exam was performed according to the departmental dose-optimization program which includes automated exposure control, adjustment of the mA and/or kV according to patient size and/or use of iterative reconstruction technique. COMPARISON:  Brain MRI 12/20/2020 FINDINGS: Brain: No evidence of acute infarction, hemorrhage, hydrocephalus, extra-axial collection or mass lesion/mass effect. Generalized brain atrophy with chronic small vessel ischemia in the cerebral white matter. 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 Simple cyst centered at the lower left sylvian fissure and middle cranial fossa with mass effect on the adjacent brain, up to 7 cm. Vascular: No hyperdense vessel  or unexpected calcification. Skull: Normal. Negative for fracture or focal lesion. Sinuses/Orbits: No acute finding. IMPRESSION: No acute finding or change since 2022, as described. Electronically Signed   By: Ronnette Coke M.D.   On: 04/24/2024 10:10   Assessment/Plan Wayne Walls is a 89 y.o. male with medical history significant for advanced dementia who was found down at his memory care unit and unfortunately has a left femoral neck fracture.   Left femoral neck fracture-due to mechanical fall, though details are unclear due to the patient's advanced dementia -Inpatient admission -Pain control -Has been seen by orthopedic surgery.  Family is leaning toward surgical repair, but after extensive discussion son [name] requests to delay surgery by a day or 2 so they have a chance to discuss as a family  Dementia-continue daily Aricept , with Haldol as  needed for agitation  DVT prophylaxis: Lovenox      Code Status: Full Code  Consults called: Orthopedic surgery  Admission status: The appropriate patient status for this patient is INPATIENT. Inpatient status is judged to be reasonable and necessary in order to provide the required intensity of service to ensure the patient's safety. The patient's presenting symptoms, physical exam findings, and initial radiographic and laboratory data in the context of their chronic comorbidities is felt to place them at high risk for further clinical deterioration. Furthermore, it is not anticipated that the patient will be medically stable for discharge from the hospital within 2 midnights of admission.    I certify that at the point of admission it is my clinical judgment that the patient will require inpatient hospital care spanning beyond 2 midnights from the point of admission due to high intensity of service, high risk for further deterioration and high frequency of surveillance required  Time spent: 48 minutes  Jamis Kryder Rickey Charm MD [facility] Pager [phone]  If 7PM-7AM, please contact night-coverage [email or web address] Password [email or web address]  04/24/2024, 2:48 PM

## 2024-04-24 NOTE — ED Provider Notes (Signed)
 [facility] AT [facility] Provider Note   CSN: [id number] Arrival date & time: 04/24/24  2440     History  Chief Complaint  Patient presents with   Wayne Walls is a 89 y.o. male.  HPI    89 year old male comes in with chief complaint of mechanical fall.  Patient has history of stroke, frequent falls, cognitive decline.  He resides at nursing home.  According to the nursing home, patient was found on the floor.  Unwitnessed fall.  Level 5 caveat for dementia.  Patient not sure what happened.  He is currently complaining of no pain.  Specifically he has no headache, neck pain, chest pain, abdominal pain, back pain, hip pain.  Home Medications Prior to Admission medications   Medication Sig Start Date End Date Taking? Authorizing Provider  atorvastatin  (LIPITOR) 20 MG tablet Take 1 tablet (20 mg total) by mouth daily. 12/23/20   Ghimire, Estil Heman, MD      Allergies    Patient has no known allergies.    Review of Systems   Review of Systems  All other systems reviewed and are negative.   Physical Exam Updated Vital Signs BP (!) 143/92 (BP Location: Left Arm)   Pulse 93   Temp (!) 96.6 F (35.9 C) (Axillary)   Resp (!) 23   SpO2 (!) 87%  Physical Exam Vitals and nursing note reviewed.  Constitutional:      Appearance: He is well-developed.  HENT:     Head: Atraumatic.  Eyes:     Extraocular Movements: Extraocular movements intact.     Pupils: Pupils are equal, round, and reactive to light.  Neck:     Comments: No midline c-spine tenderness, pt able to turn head to 45 degrees bilaterally without any pain and able to flex neck to the chest and extend without any pain or neurologic symptoms.  Cardiovascular:     Rate and Rhythm: Normal rate.  Pulmonary:     Effort: Pulmonary effort is normal.  Abdominal:     Tenderness: There is no abdominal tenderness.  Musculoskeletal:     Cervical back: Neck supple.     Comments:  Externally rotated left hip Patient has tenderness to palpation of the left knee Patient noted to have bruising over the right knee as well. Patient has some erythema around the nose  Skin:    General: Skin is warm.  Neurological:     Mental Status: He is alert and oriented to person, place, and time.     ED Results / Procedures / Treatments   Labs (all labs ordered are listed, but only abnormal results are displayed) Labs Reviewed  CBC WITH DIFFERENTIAL/PLATELET - Abnormal; Notable for the following components:      Result Value   WBC 11.9 (*)    MCV 100.6 (*)    Neutro Abs 10.6 (*)    Lymphs Abs 0.4 (*)    Abs Immature Granulocytes 0.09 (*)    All other components within normal limits  PROTIME-INR  BASIC METABOLIC PANEL WITH GFR  I-STAT CHEM 8, ED  TYPE AND SCREEN  ABO/RH    EKG EKG Interpretation Date/Time:  Friday Apr 24 2024 09:23:04 EDT Ventricular Rate:  79 PR Interval:  185 QRS Duration:  148 QT Interval:  461 QTC Calculation: 529 R Axis:   -74  Text Interpretation: Sinus rhythm Ventricular premature complex Right bundle branch block Anterolateral infarct, age indeterminate Confirmed by Lewis Red, Patirica Longshore (  [id number]) on 04/24/2024 1:29:00 PM  Radiology DG Hip Unilat W or Wo Pelvis 2-3 Views Left Result Date: 04/24/2024 CLINICAL DATA:  Hip pain status post fall. EXAM: DG HIP (WITH OR WITHOUT PELVIS) 2-3V LEFT COMPARISON:  None available FINDINGS: Acute, mildly impacted and angulated fracture of the LEFT femoral neck. Hernia repair mesh noted within the pelvis. IMPRESSION: Acute LEFT femoral neck fracture. Electronically Signed   By: Elester Grim M.D.   On: 04/24/2024 12:26   DG Chest 2 View Result Date: 04/24/2024 CLINICAL DATA:  Status post fall off.  RIGHT hip and knee pain. EXAM: CHEST - 2 VIEW COMPARISON:  12/19/2020 FINDINGS: Cardiomediastinal silhouette and pulmonary vasculature are within normal limits. Mild bibasilar atelectasis is present.  Lung are otherwise  clear. Severe compression deformity of midthoracic vertebral body is new since prior chest radiograph from 07/06/2008, ultimately of uncertain chronicity. IMPRESSION: 1. No acute cardiopulmonary process. 2. Severe compression deformity of midthoracic vertebral body of uncertain chronicity. Electronically Signed   By: Elester Grim M.D.   On: 04/24/2024 12:25   DG Knee 2 Views Left Result Date: 04/24/2024 CLINICAL DATA:  RIGHT knee pain status post fall EXAM: LEFT KNEE - 1-2 VIEW COMPARISON:  None available FINDINGS: Mild soft tissue swelling overlying the patella. No fracture or dislocation. IMPRESSION: Mild soft tissue swelling overlying the patella without underlying fracture or dislocation. Electronically Signed   By: Elester Grim M.D.   On: 04/24/2024 12:23   CT Head Wo Contrast Result Date: 04/24/2024 CLINICAL DATA:  Minor head trauma EXAM: CT HEAD WITHOUT CONTRAST TECHNIQUE: Contiguous axial images were obtained from the base of the skull through the vertex without intravenous contrast. RADIATION DOSE REDUCTION: This exam was performed according to the departmental dose-optimization program which includes automated exposure control, adjustment of the mA and/or kV according to patient size and/or use of iterative reconstruction technique. COMPARISON:  Brain MRI 12/20/2020 FINDINGS: Brain: No evidence of acute infarction, hemorrhage, hydrocephalus, extra-axial collection or mass lesion/mass effect. Generalized brain atrophy with chronic small vessel ischemia in the cerebral white matter. 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 Simple cyst centered at the lower left sylvian fissure and middle cranial fossa with mass effect on the adjacent brain, up to 7 cm. Vascular: No hyperdense vessel or unexpected calcification. Skull: Normal. Negative for fracture or focal lesion. Sinuses/Orbits: No acute finding. IMPRESSION: No acute finding or change since 2022, as described. Electronically Signed   By:  Ronnette Coke M.D.   On: 04/24/2024 10:10    Procedures Procedures    Medications Ordered in ED Medications - No data to display  ED Course/ Medical Decision Making/ A&P                                 Medical Decision Making Amount and/or Complexity of Data Reviewed Labs: ordered. Radiology: ordered.  Risk Decision regarding hospitalization.   89 year old patient comes in after sustaining what appears to be a mechanical fall. Pertinent past medical includes stroke, falls, dementia. No blood thinners. Collateral history provided by nursing home, EMS.  Based on my history and exam, differential diagnosis includes: -Left hip fracture - Traumatic brain injury including intracranial hemorrhage - Long bone fractures - Contusions - Soft tissue injury - Concussion  Based on the initial assessment, the following workup was initiated CT scan of the brain, x-ray of the knee and chest, hip.  I have independently interpreted the  following imaging from the perspective of acute trauma: CT scan of the brain and x-ray of the hip and the results indicate there is no evidence of brain bleed.  On the hip x-ray, it appears that patient has femoral neck fracture.  1:29 PM I spoke with Dr. Lucienne Ryder, orthopedic surgery.  They will likely take the patient to the OR tomorrow.  Stable for admission at this time.  I called patient's home number, however the call goes to voicemail.  Final Clinical Impression(s) / ED Diagnoses Final diagnoses:  Closed fracture of left hip, initial encounter [facility])    Rx / DC Orders ED Discharge Orders     None         Deatra Face, MD 04/24/24 1330

## 2024-04-24 NOTE — ED Triage Notes (Signed)
 BIBEMS from [facility] w/ c/o right knee, right hip pain, and nose tender to the touch post unwitnessed fall this morning around 6:45 am. Per EMS pt. Found prone on the floor by staff. Hx dementia, prostate cancer.  Per EMS: BP: 106/60 HR: 80 RR:18 SPO2: 91-96% RA

## 2024-04-24 NOTE — Plan of Care (Signed)
   Problem: Coping: Goal: Level of anxiety will decrease Outcome: Progressing   Problem: Elimination: Goal: Will not experience complications related to urinary retention Outcome: Progressing   Problem: Pain Managment: Goal: General experience of comfort will improve and/or be controlled Outcome: Progressing

## 2024-04-25 ENCOUNTER — Encounter (HOSPITAL_COMMUNITY)
Admission: EM | Disposition: A | Discharge status: Hospice | Payer: Self-pay | Source: Home / Self Care | Attending: Internal Medicine

## 2024-04-25 DIAGNOSIS — S72002A Fracture of unspecified part of neck of left femur, initial encounter for closed fracture: Principal | ICD-10-CM

## 2024-04-25 DIAGNOSIS — F0390 Unspecified dementia without behavioral disturbance: Secondary | ICD-10-CM | POA: Diagnosis not present

## 2024-04-25 DIAGNOSIS — J439 Emphysema, unspecified: Secondary | ICD-10-CM

## 2024-04-25 LAB — BASIC METABOLIC PANEL WITH GFR
Anion gap: 8 (ref 5–15)
BUN: 20 mg/dL (ref 8–23)
CO2: 25 mmol/L (ref 22–32)
Calcium: 8.6 mg/dL — ABNORMAL LOW (ref 8.9–10.3)
Chloride: 107 mmol/L (ref 98–111)
Creatinine, Ser: 0.99 mg/dL (ref 0.61–1.24)
GFR, Estimated: >60 mL/min (ref >60)
Glucose, Bld: 130 mg/dL — ABNORMAL HIGH (ref 70–99)
Potassium: 4.7 mmol/L (ref 3.5–5.1)
Sodium: 140 mmol/L (ref 135–145)

## 2024-04-25 LAB — URINALYSIS, COMPLETE (UACMP) WITH MICROSCOPIC
Bacteria, UA: NONE SEEN
Bilirubin Urine: NEGATIVE
Glucose, UA: NEGATIVE mg/dL
Hgb urine dipstick: NEGATIVE
Ketones, ur: 5 mg/dL — AB
Leukocytes,Ua: NEGATIVE
Nitrite: NEGATIVE
Protein, ur: 30 mg/dL — AB
Specific Gravity, Urine: 1.027 (ref 1.005–1.030)
pH: 5 (ref 5.0–8.0)

## 2024-04-25 LAB — CBC
HCT: 46.9 % (ref 39.0–52.0)
Hemoglobin: 15.2 g/dL (ref 13.0–17.0)
MCH: 33.2 pg (ref 26.0–34.0)
MCHC: 32.4 g/dL (ref 30.0–36.0)
MCV: 102.4 fL — ABNORMAL HIGH (ref 80.0–100.0)
Platelets: 177 10*3/uL (ref 150–400)
RBC: 4.58 MIL/uL (ref 4.22–5.81)
RDW: 13.5 % (ref 11.5–15.5)
WBC: 10.2 10*3/uL (ref 4.0–10.5)
nRBC: 0 % (ref 0.0–0.2)

## 2024-04-25 SURGERY — HEMIARTHROPLASTY, HIP, DIRECT ANTERIOR APPROACH, FOR FRACTURE
Anesthesia: Choice | Laterality: Left

## 2024-04-25 MED ORDER — IPRATROPIUM-ALBUTEROL 0.5-2.5 (3) MG/3ML IN SOLN: 3.0000 mL | RESPIRATORY_TRACT | Status: DC | PRN

## 2024-04-25 MED ORDER — ACETAMINOPHEN 500 MG PO TABS
500.0000 mg | ORAL_TABLET | Freq: Three times a day (TID) | ORAL | Status: DC
  Administered 2024-04-26 – 2024-04-27 (×2): 500 mg via ORAL
  Filled 2024-04-25 (×3): qty 1

## 2024-04-25 MED ORDER — POLYETHYLENE GLYCOL 3350 17 G PO PACK: 17.0000 g | PACK | Freq: Every day | ORAL | Status: DC

## 2024-04-25 MED ORDER — UMECLIDINIUM BROMIDE 62.5 MCG/ACT IN AEPB
1.0000 | INHALATION_SPRAY | Freq: Every day | RESPIRATORY_TRACT | Status: DC
  Administered 2024-04-30 – 2024-05-03 (×4): 1 via RESPIRATORY_TRACT
  Filled 2024-04-25: qty 7

## 2024-04-25 MED ORDER — FLUTICASONE FUROATE-VILANTEROL 200-25 MCG/ACT IN AEPB
1.0000 | INHALATION_SPRAY | Freq: Every day | RESPIRATORY_TRACT | Status: DC
  Administered 2024-04-30 – 2024-05-03 (×4): 1 via RESPIRATORY_TRACT
  Filled 2024-04-25: qty 28

## 2024-04-25 MED ORDER — SENNOSIDES-DOCUSATE SODIUM 8.6-50 MG PO TABS
1.0000 | ORAL_TABLET | Freq: Two times a day (BID) | ORAL | Status: DC
  Administered 2024-04-25: 1 via ORAL
  Filled 2024-04-25: qty 1

## 2024-04-25 MED ORDER — ORAL CARE MOUTH RINSE: 15.0000 mL | OROMUCOSAL | Status: DC | PRN

## 2024-04-25 NOTE — Progress Notes (Addendum)
 PROGRESS NOTE    Wayne Walls  WGN:[id number] DOB: 02-Oct-1933 DOA: 04/24/2024 PCP: Shannan Dart., FNP    Chief Complaint  Patient presents with   Fall    Brief Narrative:  Patient 89 year old gentleman history of advanced dementia, COPD, depression who presented to ED after being found down at memory care unit with imaging consistent with a left femoral neck fracture.  Orthopedics consulted and following.   Assessment & Plan:   Principal Problem:   Closed left hip fracture, initial encounter (HCC) Active Problems:   Dementia without behavioral disturbance (HCC)   COPD with emphysema (HCC)  #1 left femoral neck fracture -Secondary to mechanical fall with details unclear as patient noted with severe advanced dementia. - Patient seen in consultation by orthopedics, he does not have family leaning toward surgical repair however per orthopedics note from today, 04/25/2024, orthopedic MD spoke with patient's son in length, in detail and family leaning toward surgery however would rather wait until early next week to see how patient does from a pain standpoint given the fact that he has significant dementia with a decline. - Pain management, DVT prophylaxis per orthopedics.  2.  Advanced dementia -Continue home regimen Aricept . - Haldol as needed for agitation. - Will consult with palliative care for goals of care.  3.  COPD -Place on Dulera, Incruse, DuoNebs as needed.  4.   DVT prophylaxis: Lovenox  Code Status: Full Family Communication: No family at bedside.  Tried calling son, [name] however went to [employer]. Disposition: TBD  Status is: Inpatient Remains inpatient appropriate because: Severity of illness   Consultants:  Orthopedics: Dr. Lucienne Ryder 04/25/2024 Wound care RN  Procedures:  CT head 04/24/2024 Chest x-ray 04/24/2024 Plain films of the left hip and pelvis 04/24/2024 Plain films of the left knee 04/24/2024.  Antimicrobials:  Anti-infectives (From  admission, onward)    None         Subjective: Patient sleeping mouth open, mittens on.  Easily arousable.  Denies any chest pain or shortness of breath.  No abdominal pain.  Complains of left hip pain.  Objective: Vitals:   04/24/24 2324 04/24/24 2342 04/25/24 0551 04/25/24 1341  BP: 127/79  (!) 106/48 (!) 119/94  Pulse: 94  93 97  Resp: 18  16 20   Temp:  (!) 97.4 F (36.3 C) 97.8 F (36.6 C) (!) 97.5 F (36.4 C)  TempSrc:  Oral  Oral  SpO2: 93%  91% 93%  Weight:        Intake/Output Summary (Last 24 hours) at 04/25/2024 1452 Last data filed at 04/25/2024 1300 Gross per 24 hour  Intake 450 ml  Output 500 ml  Net -50 ml   Filed Weights   04/24/24 1510  Weight: 80.3 kg    Examination:  General exam: NAD.  Dry mucous membranes. Respiratory system: Clear to auscultation anterior lung fields.Aaron Aas Respiratory effort normal. Cardiovascular system: S1 & S2 heard, RRR. No JVD, murmurs, rubs, gallops or clicks. No pedal edema. Gastrointestinal system: Abdomen is nondistended, soft and nontender. No organomegaly or masses felt. Normal bowel sounds heard. Central nervous system: Alert. No focal neurological deficits. Extremities: Left lower extremity externally rotated and shortened, and tender to palpation.   Skin: No rashes, lesions or ulcers Psychiatry: Judgement and insight appear poor to fair. Mood & affect appropriate.     Data Reviewed: I have personally reviewed following labs and imaging studies  CBC: Recent Labs  Lab 04/24/24 1239 04/25/24 0328  WBC 11.9* 10.2  NEUTROABS 10.6*  --  HGB 16.2 15.2  HCT 49.3 46.9  MCV 100.6* 102.4*  PLT 165 177    Basic Metabolic Panel: Recent Labs  Lab 04/24/24 1239 04/25/24 0328  NA 143 140  K 3.5 4.7  CL 107 107  CO2 20* 25  GLUCOSE 122* 130*  BUN 19 20  CREATININE 0.89 0.99  CALCIUM  8.7* 8.6*    GFR: CrCl cannot be calculated (Unknown ideal weight.).  Liver Function Tests: No results for input(s):  "AST", "ALT", "ALKPHOS", "BILITOT", "PROT", "ALBUMIN" in the last 168 hours.  CBG: No results for input(s): "GLUCAP" in the last 168 hours.   No results found for this or any previous visit (from the past 240 hours).       Radiology Studies: DG Hip Unilat W or Wo Pelvis 2-3 Views Left Result Date: 04/24/2024 CLINICAL DATA:  Hip pain status post fall. EXAM: DG HIP (WITH OR WITHOUT PELVIS) 2-3V LEFT COMPARISON:  None available FINDINGS: Acute, mildly impacted and angulated fracture of the LEFT femoral neck. Hernia repair mesh noted within the pelvis. IMPRESSION: Acute LEFT femoral neck fracture. Electronically Signed   By: Elester Grim M.D.   On: 04/24/2024 12:26   DG Chest 2 View Result Date: 04/24/2024 CLINICAL DATA:  Status post fall off.  RIGHT hip and knee pain. EXAM: CHEST - 2 VIEW COMPARISON:  12/19/2020 FINDINGS: Cardiomediastinal silhouette and pulmonary vasculature are within normal limits. Mild bibasilar atelectasis is present.  Lung are otherwise clear. Severe compression deformity of midthoracic vertebral body is new since prior chest radiograph from 07/06/2008, ultimately of uncertain chronicity. IMPRESSION: 1. No acute cardiopulmonary process. 2. Severe compression deformity of midthoracic vertebral body of uncertain chronicity. Electronically Signed   By: Elester Grim M.D.   On: 04/24/2024 12:25   DG Knee 2 Views Left Result Date: 04/24/2024 CLINICAL DATA:  RIGHT knee pain status post fall EXAM: LEFT KNEE - 1-2 VIEW COMPARISON:  None available FINDINGS: Mild soft tissue swelling overlying the patella. No fracture or dislocation. IMPRESSION: Mild soft tissue swelling overlying the patella without underlying fracture or dislocation. Electronically Signed   By: Elester Grim M.D.   On: 04/24/2024 12:23   CT Head Wo Contrast Result Date: 04/24/2024 CLINICAL DATA:  Minor head trauma EXAM: CT HEAD WITHOUT CONTRAST TECHNIQUE: Contiguous axial images were obtained from the base of the  skull through the vertex without intravenous contrast. RADIATION DOSE REDUCTION: This exam was performed according to the departmental dose-optimization program which includes automated exposure control, adjustment of the mA and/or kV according to patient size and/or use of iterative reconstruction technique. COMPARISON:  Brain MRI 12/20/2020 FINDINGS: Brain: No evidence of acute infarction, hemorrhage, hydrocephalus, extra-axial collection or mass lesion/mass effect. Generalized brain atrophy with chronic small vessel ischemia in the cerebral white matter. 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 Simple cyst centered at the lower left sylvian fissure and middle cranial fossa with mass effect on the adjacent brain, up to 7 cm. Vascular: No hyperdense vessel or unexpected calcification. Skull: Normal. Negative for fracture or focal lesion. Sinuses/Orbits: No acute finding. IMPRESSION: No acute finding or change since 2022, as described. Electronically Signed   By: Ronnette Coke M.D.   On: 04/24/2024 10:10        Scheduled Meds:  acetaminophen  500 mg Oral TID   donepezil   5 mg Oral Daily   enoxaparin  (LOVENOX ) injection  40 mg Subcutaneous Q24H   fluticasone furoate-vilanterol  1 puff Inhalation Daily   liver oil-zinc oxide  Topical BID   polyethylene glycol  17 g Oral Daily   senna-docusate  1 tablet Oral BID   umeclidinium bromide  1 puff Inhalation Daily   Continuous Infusions:  sodium chloride  75 mL/hr at 04/25/24 0929     LOS: 1 day    Time spent: 40 minutes    Hilda Lovings, MD [facility]   To contact the attending provider between 7A-7P or the covering provider during after hours 7P-7A, please log into the web site [email or web address] and access using universal [place] password for that web site. If you do not have the password, please call the hospital operator.  04/25/2024, 2:52 PM

## 2024-04-25 NOTE — Plan of Care (Signed)
   Problem: Education: Goal: Knowledge of General Education information will improve Description: Including pain rating scale, medication(s)/side effects and non-pharmacologic comfort measures Outcome: Not Progressing   Problem: Health Behavior/Discharge Planning: Goal: Ability to manage health-related needs will improve Outcome: Not Progressing   Problem: Clinical Measurements: Goal: Ability to maintain clinical measurements within normal limits will improve Outcome: Not Progressing   Problem: Activity: Goal: Risk for activity intolerance will decrease Outcome: Not Progressing

## 2024-04-25 NOTE — Progress Notes (Signed)
 Patient ID: Wayne Walls, male   DOB: Mar 27, 1933, 89 y.o.   MRN: [id number] The patient is sitting up in bed and being fed by one of the nursing assistants.  He does answer limited questions with conversation.  He does have discomfort in his left hip when they first set him up to eat breakfast.  The family would like us  to hold off on surgery until they discussed this as a family in terms of nonoperative versus operative treatment.  I will circle back around to discuss this with the family as well.

## 2024-04-25 NOTE — Progress Notes (Signed)
 Patient ID: Wayne Walls, male   DOB: 1933/02/01, 89 y.o.   MRN: [id number] I did speak to the patient's son in length in detail.  He and his family are leaning toward surgery.  They still would rather wait until early next week to see how he does from a pain standpoint given the fact that he has significant dementia and has been in decline.  They do understand that without surgery, he will definitely be bedridden and there is a high morbidity and mortality associated with going without surgery.  However surgery does have its own set of risks.  They will still think about this over the next day or 2.  I will still keep checking in and go from there in terms of trying to figure out when to schedule the patient for surgery if that is the collective decision to proceed with a partial hip replacement.

## 2024-04-25 NOTE — Plan of Care (Signed)
   Problem: Education: Goal: Knowledge of General Education information will improve Description: Including pain rating scale, medication(s)/side effects and non-pharmacologic comfort measures Outcome: Not Progressing

## 2024-04-26 ENCOUNTER — Inpatient Hospital Stay (HOSPITAL_COMMUNITY)

## 2024-04-26 DIAGNOSIS — F0390 Unspecified dementia without behavioral disturbance: Secondary | ICD-10-CM | POA: Diagnosis not present

## 2024-04-26 DIAGNOSIS — G9341 Metabolic encephalopathy: Secondary | ICD-10-CM | POA: Diagnosis not present

## 2024-04-26 DIAGNOSIS — S72002A Fracture of unspecified part of neck of left femur, initial encounter for closed fracture: Secondary | ICD-10-CM | POA: Diagnosis not present

## 2024-04-26 DIAGNOSIS — E860 Dehydration: Secondary | ICD-10-CM

## 2024-04-26 DIAGNOSIS — R1310 Dysphagia, unspecified: Secondary | ICD-10-CM

## 2024-04-26 LAB — CBC WITH DIFFERENTIAL/PLATELET
Abs Immature Granulocytes: 0.08 10*3/uL — ABNORMAL HIGH (ref 0.00–0.07)
Basophils Absolute: 0 10*3/uL (ref 0.0–0.1)
Basophils Relative: 0 %
Eosinophils Absolute: 0 10*3/uL (ref 0.0–0.5)
Eosinophils Relative: 0 %
HCT: 45.1 % (ref 39.0–52.0)
Hemoglobin: 14.2 g/dL (ref 13.0–17.0)
Immature Granulocytes: 1 %
Lymphocytes Relative: 7 %
Lymphs Abs: 0.7 10*3/uL (ref 0.7–4.0)
MCH: 32.8 pg (ref 26.0–34.0)
MCHC: 31.5 g/dL (ref 30.0–36.0)
MCV: 104.2 fL — ABNORMAL HIGH (ref 80.0–100.0)
Monocytes Absolute: 0.9 10*3/uL (ref 0.1–1.0)
Monocytes Relative: 9 %
Neutro Abs: 8.3 10*3/uL — ABNORMAL HIGH (ref 1.7–7.7)
Neutrophils Relative %: 83 %
Platelets: 169 10*3/uL (ref 150–400)
RBC: 4.33 MIL/uL (ref 4.22–5.81)
RDW: 13.7 % (ref 11.5–15.5)
WBC: 10 10*3/uL (ref 4.0–10.5)
nRBC: 0 % (ref 0.0–0.2)

## 2024-04-26 LAB — AMMONIA: Ammonia: <13 umol/L (ref 9–35)

## 2024-04-26 LAB — COMPREHENSIVE METABOLIC PANEL WITH GFR
ALT: 24 U/L (ref 0–44)
AST: 53 U/L — ABNORMAL HIGH (ref 15–41)
Albumin: 3.1 g/dL — ABNORMAL LOW (ref 3.5–5.0)
Alkaline Phosphatase: 94 U/L (ref 38–126)
Anion gap: 11 (ref 5–15)
BUN: 24 mg/dL — ABNORMAL HIGH (ref 8–23)
CO2: 23 mmol/L (ref 22–32)
Calcium: 8 mg/dL — ABNORMAL LOW (ref 8.9–10.3)
Chloride: 106 mmol/L (ref 98–111)
Creatinine, Ser: 0.91 mg/dL (ref 0.61–1.24)
GFR, Estimated: >60 mL/min (ref >60)
Glucose, Bld: 113 mg/dL — ABNORMAL HIGH (ref 70–99)
Potassium: 4.2 mmol/L (ref 3.5–5.1)
Sodium: 140 mmol/L (ref 135–145)
Total Bilirubin: 1.8 mg/dL — ABNORMAL HIGH (ref 0.0–1.2)
Total Protein: 6.1 g/dL — ABNORMAL LOW (ref 6.5–8.1)

## 2024-04-26 LAB — URINE CULTURE: Culture: NO GROWTH

## 2024-04-26 LAB — BASIC METABOLIC PANEL WITH GFR
Anion gap: 11 (ref 5–15)
BUN: 22 mg/dL (ref 8–23)
CO2: 22 mmol/L (ref 22–32)
Calcium: 8.4 mg/dL — ABNORMAL LOW (ref 8.9–10.3)
Chloride: 109 mmol/L (ref 98–111)
Creatinine, Ser: 0.85 mg/dL (ref 0.61–1.24)
GFR, Estimated: >60 mL/min (ref >60)
Glucose, Bld: 118 mg/dL — ABNORMAL HIGH (ref 70–99)
Potassium: 4.1 mmol/L (ref 3.5–5.1)
Sodium: 142 mmol/L (ref 135–145)

## 2024-04-26 LAB — BLOOD GAS, ARTERIAL
Acid-base deficit: 0.3 mmol/L (ref 0.0–2.0)
Bicarbonate: 22.8 mmol/L (ref 20.0–28.0)
Drawn by: 270211
O2 Content: 3 L/min
O2 Saturation: 96.6 %
Patient temperature: 35.9
pCO2 arterial: 30 mmHg — ABNORMAL LOW (ref 32–48)
pH, Arterial: 7.48 — ABNORMAL HIGH (ref 7.35–7.45)
pO2, Arterial: 61 mmHg — ABNORMAL LOW (ref 83–108)

## 2024-04-26 LAB — CBC
HCT: 47.4 % (ref 39.0–52.0)
Hemoglobin: 15.5 g/dL (ref 13.0–17.0)
MCH: 33.5 pg (ref 26.0–34.0)
MCHC: 32.7 g/dL (ref 30.0–36.0)
MCV: 102.6 fL — ABNORMAL HIGH (ref 80.0–100.0)
Platelets: 169 10*3/uL (ref 150–400)
RBC: 4.62 MIL/uL (ref 4.22–5.81)
RDW: 13.6 % (ref 11.5–15.5)
WBC: 9.8 10*3/uL (ref 4.0–10.5)
nRBC: 0 % (ref 0.0–0.2)

## 2024-04-26 MED ORDER — PANTOPRAZOLE SODIUM 40 MG IV SOLR
40.0000 mg | Freq: Every day | INTRAVENOUS | Status: DC
  Administered 2024-04-26 – 2024-05-03 (×8): 40 mg via INTRAVENOUS
  Filled 2024-04-26 (×8): qty 10

## 2024-04-26 MED ORDER — SENNOSIDES-DOCUSATE SODIUM 8.6-50 MG PO TABS: 1.0000 | ORAL_TABLET | Freq: Every evening | ORAL | Status: DC | PRN

## 2024-04-26 MED ORDER — SODIUM CHLORIDE 0.9 % IV SOLN: INTRAVENOUS | Status: DC

## 2024-04-26 MED ORDER — MORPHINE SULFATE (PF) 2 MG/ML IV SOLN
0.5000 mg | INTRAVENOUS | Status: DC | PRN
  Administered 2024-04-27 – 2024-05-02 (×5): 0.5 mg via INTRAVENOUS
  Administered 2024-05-04 – 2024-05-05 (×3): 1 mg via INTRAVENOUS
  Filled 2024-04-26 (×9): qty 1

## 2024-04-26 MED ORDER — POLYETHYLENE GLYCOL 3350 17 G PO PACK: 17.0000 g | PACK | Freq: Every day | ORAL | Status: DC | PRN

## 2024-04-26 NOTE — Progress Notes (Signed)
   04/26/24 0831  TOC Brief Assessment  Insurance and Status Reviewed  Home environment has been reviewed From home, with spouse  Prior level of function: Independent  Prior/Current Home Services No current home services  Social Drivers of Health Review SDOH reviewed no interventions necessary  Readmission risk has been reviewed Yes  Transition of care needs transition of care needs identified, TOC will continue to follow (Surgery recommended, family deciding.)   TOC to follow, may have DC needs.

## 2024-04-26 NOTE — Progress Notes (Signed)
 PROGRESS NOTE    Wayne Walls  ZOX:[id number] DOB: [birth year] DOA: 04/24/2024 PCP: Shannan Dart., FNP    Chief Complaint  Patient presents with   Fall    Brief Narrative:  Patient 89 year old gentleman history of advanced dementia, COPD, depression who presented to ED after being found down at memory care unit with imaging consistent with a left femoral neck fracture.  Orthopedics consulted and following.   Assessment & Plan:   Principal Problem:   Closed left hip fracture, initial encounter [facility]) Active Problems:   Acute metabolic encephalopathy   Dysphagia   Dementia without behavioral disturbance (HCC)   COPD with emphysema (HCC)   Dehydration  #1 left femoral neck fracture -Secondary to mechanical fall with details unclear as patient noted with severe advanced dementia. - Patient seen in consultation by orthopedics, he does not have family leaning toward surgical repair however per orthopedics note from today, 04/25/2024, orthopedic MD spoke with patient's son in length, in detail and family leaning toward surgery however would rather wait until early next week to see how patient does from a pain standpoint given the fact that he has significant dementia with a decline. -Patient with significant left lower extremity pain to minimal movement. -Consult with anesthesia for nerve block. - Pain management, DVT prophylaxis per orthopedics.  2.  Acute metabolic encephalopathy -Patient lethargic, drowsy, opens eyes to verbal stimuli, answers a few questions and drifts back off to sleep. - Family at bedside and said this is an acute change from patient's baseline. - Patient noted to have some coughing with oral intake as noted per RN today. - Patient currently NPO. - Check a CBC with differential, comprehensive metabolic profile, ammonia level, ABG, chest x-ray, UA with cultures and sensitivities. - IV fluids. - Supportive care.  2.  Advanced dementia -Continue home  regimen Aricept . - Haldol as needed for agitation. - Palliative care consultation placed for goals of care.    3.  COPD - Continue Dulera, Incruse, DuoNebs as needed.  4.  Dysphagia -Per RN patient with some coughing and choking with oral intake this morning. - NPO. - SLP evaluation.  5.  Dehydration - IVF.   DVT prophylaxis: Lovenox  Code Status: Full Family Communication: Updated son and daughter-in-law at bedside.   Disposition: TBD  Status is: Inpatient Remains inpatient appropriate because: Severity of illness   Consultants:  Orthopedics: Dr. Lucienne Ryder 04/25/2024 Wound care RN  Procedures:  CT head 04/24/2024 Chest x-ray 04/24/2024 Plain films of the left hip and pelvis 04/24/2024 Plain films of the left knee 04/24/2024.  Antimicrobials:  Anti-infectives (From admission, onward)    None         Subjective: Patient sleeping.  Arousable but drifts back off to sleep.  Patient denies any chest pain or significant abdominal pain.  Denies any significant shortness of breath.  Patient lethargic.  Family at bedside.  Patient with significant left hip pain.  Objective: Vitals:   04/25/24 2108 04/25/24 2109 04/26/24 0403 04/26/24 1348  BP: (!) 122/98  118/73 137/80  Pulse: (!) 105 (!) 108 99 91  Resp: 20  18 20   Temp: 97.9 F (36.6 C)  97.7 F (36.5 C) 98 F (36.7 C)  TempSrc: Oral  Oral Oral  SpO2:  94% 93% 96%  Weight:      Height:        Intake/Output Summary (Last 24 hours) at 04/26/2024 1844 Last data filed at 04/26/2024 1738 Gross per 24 hour  Intake 402.65 ml  Output 500 ml  Net -97.35 ml   Filed Weights   04/24/24 1510 04/25/24 1636  Weight: 80.3 kg 80.3 kg    Examination:  General exam: NAD.  Extremities dry mucous membranes. Respiratory system: CTAB anterior lung fields.  No wheezing.  Cardiovascular system: S1 & S2 heard, RRR. No JVD, murmurs, rubs, gallops or clicks. No pedal edema. Gastrointestinal system: Abdomen is nondistended, soft and  nontender. No organomegaly or masses felt. Normal bowel sounds heard. Central nervous system: Alert. No focal neurological deficits. Extremities: Left lower extremity externally rotated and shortened, and tender to palpation.   Skin: No rashes, lesions or ulcers Psychiatry: Judgement and insight appear poor to fair. Mood & affect appropriate.     Data Reviewed: I have personally reviewed following labs and imaging studies  CBC: Recent Labs  Lab 04/24/24 1239 04/25/24 0328 04/26/24 0324  WBC 11.9* 10.2 9.8  NEUTROABS 10.6*  --   --   HGB 16.2 15.2 15.5  HCT 49.3 46.9 47.4  MCV 100.6* 102.4* 102.6*  PLT 165 177 169    Basic Metabolic Panel: Recent Labs  Lab 04/24/24 1239 04/25/24 0328 04/26/24 0324  NA 143 140 142  K 3.5 4.7 4.1  CL 107 107 109  CO2 20* 25 22  GLUCOSE 122* 130* 118*  BUN 19 20 22   CREATININE 0.89 0.99 0.85  CALCIUM  8.7* 8.6* 8.4*    GFR: Estimated Creatinine Clearance: 67.9 mL/min (by C-G formula based on SCr of 0.85 mg/dL).  Liver Function Tests: No results for input(s): "AST", "ALT", "ALKPHOS", "BILITOT", "PROT", "ALBUMIN" in the last 168 hours.  CBG: No results for input(s): "GLUCAP" in the last 168 hours.   Recent Results (from the past 240 hours)  Urine Culture (for pregnant, neutropenic or urologic patients or patients with an indwelling urinary catheter)     Status: None   Collection Time: 04/25/24  2:27 PM   Specimen: Urine, Clean Catch  Result Value Ref Range Status   Specimen Description   Final    URINE, CLEAN CATCH Performed at [facility], [address] [place], [place] [address]    Special Requests   Final    NONE Performed at [facility], [address], [place], [place] [address]    Culture   Final    NO GROWTH Performed at [facility], [address], [place], [place] [address]    Report Status 04/26/2024 FINAL  Final         Radiology Studies: No results  found.       Scheduled Meds:  acetaminophen  500 mg Oral TID   donepezil   5 mg Oral Daily   enoxaparin  (LOVENOX ) injection  40 mg Subcutaneous Q24H   fluticasone furoate-vilanterol  1 puff Inhalation Daily   liver oil-zinc oxide   Topical BID   umeclidinium bromide  1 puff Inhalation Daily   Continuous Infusions:  sodium chloride  100 mL/hr at 04/26/24 1738      LOS: 2 days    Time spent: 40 minutes    Hilda Lovings, MD [facility]   To contact the attending provider between 7A-7P or the covering provider during after hours 7P-7A, please log into the web site [email or web address] and access using universal [place] password for that web site. If you do not have the password, please call the hospital operator.  04/26/2024, 6:44 PM

## 2024-04-26 NOTE — Progress Notes (Signed)
 Orthopedic Tech Progress Note Patient Details:  Wayne Walls Oct 28, 1933 [id number]  Patient ID: [name], male   DOB: 1933/02/08, 89 y.o.   MRN: [id number] No OHF. Age. Herbie Loll 04/26/2024, 6:37 PM

## 2024-04-26 NOTE — Progress Notes (Signed)
 Patient ID: Wayne Walls, male   DOB: [birth year], 89 y.o.   MRN: [id number] I spoke with the patient's son yesterday and the family like us  to hold off on surgery until later this week.  I believe they are out of town.  The patient does stay in a memory care unit but prior to this mechanical fall does ambulate.  Nursing did let me know that they are having a hard time getting the patient sitting up due to significant left hip pain.  It sounds like from a quality-of-life and pain control standpoint, a left hip hemiarthroplasty would be warranted.  I will touch base again with the family tomorrow (Monday).  From my standpoint, I am operating at [facility] on Tuesday morning and would potentially put him on the operating room schedule for [facility] later in the day Tuesday this week.

## 2024-04-26 NOTE — Plan of Care (Signed)
  Problem: Education: Goal: Knowledge of General Education information will improve Description Including pain rating scale, medication(s)/side effects and non-pharmacologic comfort measures Outcome: Not Progressing   Problem: Health Behavior/Discharge Planning: Goal: Ability to manage health-related needs will improve Outcome: Not Progressing   Problem: Clinical Measurements: Goal: Ability to maintain clinical measurements within normal limits will improve Outcome: Not Progressing   Problem: Nutrition: Goal: Adequate nutrition will be maintained Outcome: Not Progressing   

## 2024-04-26 NOTE — Plan of Care (Signed)
   Problem: Coping: Goal: Level of anxiety will decrease Outcome: Progressing   Problem: Pain Managment: Goal: General experience of comfort will improve and/or be controlled Outcome: Progressing   Problem: Safety: Goal: Ability to remain free from injury will improve Outcome: Progressing

## 2024-04-26 NOTE — Progress Notes (Signed)
       Overnight   NAME: Wayne Walls MRN: [id number] DOB : [birth year]    Date of Service   04/26/2024   HPI/Events of Note    Notified by Attending to follow up on imaging. ====================================== Imaging :  IMPRESSION: Underinflation. Enlarged cardiopericardial silhouette with vascular congestion. Interstitial changes seen along the lung bases. Edema versus subtle infiltrate. Question small effusions. Recommend follow-up.     Electronically Signed   By: Adrianna Horde M.D.   On: 04/26/2024 19:23  ======================================= Vascular congestion is noted at time of Imaging. No consideration of aspiration noted in imaging.    Interventions/ Plan   Continue Attending orders. Follow-up per Attending discretion.      Denece Finger BSN MSNA MSN ACNPC-AG Acute Care Nurse Practitioner [facility]

## 2024-04-27 DIAGNOSIS — S72002A Fracture of unspecified part of neck of left femur, initial encounter for closed fracture: Secondary | ICD-10-CM

## 2024-04-27 DIAGNOSIS — G9341 Metabolic encephalopathy: Secondary | ICD-10-CM | POA: Diagnosis not present

## 2024-04-27 DIAGNOSIS — Z515 Encounter for palliative care: Secondary | ICD-10-CM | POA: Diagnosis not present

## 2024-04-27 DIAGNOSIS — Z66 Do not resuscitate: Secondary | ICD-10-CM | POA: Diagnosis not present

## 2024-04-27 DIAGNOSIS — E860 Dehydration: Secondary | ICD-10-CM | POA: Diagnosis not present

## 2024-04-27 DIAGNOSIS — F0390 Unspecified dementia without behavioral disturbance: Secondary | ICD-10-CM | POA: Diagnosis not present

## 2024-04-27 DIAGNOSIS — Z7189 Other specified counseling: Secondary | ICD-10-CM | POA: Diagnosis not present

## 2024-04-27 DIAGNOSIS — R410 Disorientation, unspecified: Secondary | ICD-10-CM

## 2024-04-27 DIAGNOSIS — R4589 Other symptoms and signs involving emotional state: Secondary | ICD-10-CM

## 2024-04-27 LAB — CBC
HCT: 46.1 % (ref 39.0–52.0)
Hemoglobin: 14.3 g/dL (ref 13.0–17.0)
MCH: 32.6 pg (ref 26.0–34.0)
MCHC: 31 g/dL (ref 30.0–36.0)
MCV: 105.3 fL — ABNORMAL HIGH (ref 80.0–100.0)
Platelets: 163 10*3/uL (ref 150–400)
RBC: 4.38 MIL/uL (ref 4.22–5.81)
RDW: 13.6 % (ref 11.5–15.5)
WBC: 8.7 10*3/uL (ref 4.0–10.5)
nRBC: 0 % (ref 0.0–0.2)

## 2024-04-27 LAB — URINALYSIS, COMPLETE (UACMP) WITH MICROSCOPIC
Bilirubin Urine: NEGATIVE
Glucose, UA: NEGATIVE mg/dL
Hgb urine dipstick: NEGATIVE
Ketones, ur: 20 mg/dL — AB
Leukocytes,Ua: NEGATIVE
Nitrite: NEGATIVE
Protein, ur: 30 mg/dL — AB
Specific Gravity, Urine: 1.025 (ref 1.005–1.030)
pH: 5 (ref 5.0–8.0)

## 2024-04-27 LAB — BASIC METABOLIC PANEL WITH GFR
Anion gap: 9 (ref 5–15)
BUN: 24 mg/dL — ABNORMAL HIGH (ref 8–23)
CO2: 22 mmol/L (ref 22–32)
Calcium: 8.3 mg/dL — ABNORMAL LOW (ref 8.9–10.3)
Chloride: 110 mmol/L (ref 98–111)
Creatinine, Ser: 0.9 mg/dL (ref 0.61–1.24)
GFR, Estimated: >60 mL/min (ref >60)
Glucose, Bld: 105 mg/dL — ABNORMAL HIGH (ref 70–99)
Potassium: 3.7 mmol/L (ref 3.5–5.1)
Sodium: 141 mmol/L (ref 135–145)

## 2024-04-27 MED ORDER — SODIUM CHLORIDE 0.9 % IV SOLN
3.0000 g | Freq: Four times a day (QID) | INTRAVENOUS | Status: AC
  Administered 2024-04-27 – 2024-05-03 (×26): 3 g via INTRAVENOUS
  Filled 2024-04-27 (×28): qty 8

## 2024-04-27 MED ORDER — STERILE WATER FOR INJECTION IJ SOLN
INTRAMUSCULAR | Status: AC
  Filled 2024-04-27: qty 10

## 2024-04-27 MED ORDER — ACETAMINOPHEN 10 MG/ML IV SOLN
1000.0000 mg | Freq: Three times a day (TID) | INTRAVENOUS | Status: AC
  Administered 2024-04-27 – 2024-04-28 (×2): 1000 mg via INTRAVENOUS
  Filled 2024-04-27 (×4): qty 100

## 2024-04-27 NOTE — Progress Notes (Signed)
 PROGRESS NOTE    Wayne Walls  ZOX:[id number] DOB: August 07, 1933 DOA: 04/24/2024 PCP: Shannan Dart., FNP    Chief Complaint  Patient presents with   Fall    Brief Narrative:  Patient 89 year old gentleman history of advanced dementia, COPD, depression who presented to ED after being found down at memory care unit with imaging consistent with a left femoral neck fracture.  Orthopedics consulted and following.   Assessment & Plan:   Principal Problem:   Closed left hip fracture, initial encounter [facility]) Active Problems:   Acute metabolic encephalopathy   Dysphagia   Dementia without behavioral disturbance (HCC)   COPD with emphysema (HCC)   Dehydration   Palliative care encounter   Goals of care, counseling/discussion   DNR (do not resuscitate)   Counseling and coordination of care   Delirium   Need for emotional support   Closed fracture of left hip (HCC)  #1 left femoral neck fracture -Secondary to mechanical fall with details unclear as patient noted with severe advanced dementia. - Patient seen in consultation by orthopedics, he does not have family leaning toward surgical repair however per orthopedics note from today, 04/25/2024, orthopedic MD spoke with patient's son in length, in detail and family leaning toward surgery however initially family would rather wait to see how patient does from a pain standpoint given the fact that he has significant dementia with a decline. -Patient with significant left lower extremity pain to minimal movement. -Anesthesia consulted for nerve block. -Family met with palliative care this morning and decision made to go ahead with surgical repair of left femoral neck fracture hopefully to be done tomorrow 04/28/2024 per orthopedics. - Pain management, DVT prophylaxis per orthopedics.  2.  Acute metabolic encephalopathy/?  Delirium -Patient lethargic, drowsy, opens eyes to verbal stimuli, answers a few questions and drifts back off to  sleep. - Family at bedside and said this is an acute change from patient's baseline. - Patient noted to have some coughing with oral intake as noted per RN on 04/26/2024.. - Patient currently NPO. - Urinalysis done unremarkable.   -Chest x-ray done with underinflation, enlarged cardiopericardial silhouette with vascular congestion.  Interstitial changes seen along the lung bases.  Edema versus subtle infiltrate.  Question small effusions.   -Ammonia levels within normal limits. - Due to patient's altered mental status we will place empirically on IV Unasyn to cover for possible aspiration pneumonia.  - IV fluids, supportive care.  2.  Advanced dementia -Continue home regimen Aricept . - Haldol as needed for agitation. - Palliative care consulted and following.    3.  COPD - Continue Dulera, Incruse, DuoNebs as needed.  4.  Dysphagia -Per RN patient with some coughing and choking with oral intake this morning. - Patient seen SLP and patient to remain n.p.o. for now.   - SLP following.   5.  Dehydration - Continue IVF.   DVT prophylaxis: Lovenox  Code Status: DNR Family Communication: Updated son and daughter and son-in-law and daughter-in-law at bedside.   Disposition: TBD  Status is: Inpatient Remains inpatient appropriate because: Severity of illness   Consultants:  Orthopedics: Dr. Lucienne Ryder 04/25/2024 Wound care RN  Procedures:  CT head 04/24/2024 Chest x-ray 04/24/2024 Plain films of the left hip and pelvis 04/24/2024 Plain films of the left knee 04/24/2024.  Antimicrobials:  Anti-infectives (From admission, onward)    Start     Dose/Rate Route Frequency Ordered Stop   04/27/24 1000  Ampicillin-Sulbactam (UNASYN) 3 g in sodium chloride  0.9 %  100 mL IVPB        3 g 200 mL/hr over 30 Minutes Intravenous Every 6 hours 04/27/24 0834           Subjective: Patient sleeping, arousable.  Slightly less drowsy today.  Family at bedside.  Family has agreed on surgery for  tomorrow.  Family spoke with palliative care earlier on.  Objective: Vitals:   04/26/24 1348 04/26/24 2127 04/27/24 0606 04/27/24 1520  BP: 137/80 131/77 133/86 137/88  Pulse: 91 95 90 87  Resp: 20 18 18 17   Temp: 98 F (36.7 C) 97.7 F (36.5 C) (!) 97.5 F (36.4 C) 98.1 F (36.7 C)  TempSrc: Oral Oral Oral Oral  SpO2: 96% 95% 95% 96%  Weight:      Height:        Intake/Output Summary (Last 24 hours) at 04/27/2024 1728 Last data filed at 04/27/2024 1500 Gross per 24 hour  Intake 1165.66 ml  Output 800 ml  Net 365.66 ml   Filed Weights   04/24/24 1510 04/25/24 1636  Weight: 80.3 kg 80.3 kg    Examination:  General exam: NAD.  Extremely dry mucous membranes. Respiratory system: CTAB anterior lung fields.  No wheezes.  No crackles.  Fair air movement. Cardiovascular system: RRR no murmurs rubs or gallops.  No JVD.  No pitting lower extremity edema.  Gastrointestinal system: Abdomen is soft, nontender, nondistended, positive bowel sounds.  No rebound.  No guarding. Central nervous system: Alert. No focal neurological deficits. Extremities: Left lower extremity externally rotated and shortened, and less tender to palpation.  Skin: No rashes, lesions or ulcers Psychiatry: Judgement and insight appear poor to fair. Mood & affect appropriate.     Data Reviewed: I have personally reviewed following labs and imaging studies  CBC: Recent Labs  Lab 04/24/24 1239 04/25/24 0328 04/26/24 0324 04/26/24 1922 04/27/24 0337  WBC 11.9* 10.2 9.8 10.0 8.7  NEUTROABS 10.6*  --   --  8.3*  --   HGB 16.2 15.2 15.5 14.2 14.3  HCT 49.3 46.9 47.4 45.1 46.1  MCV 100.6* 102.4* 102.6* 104.2* 105.3*  PLT 165 177 169 169 163    Basic Metabolic Panel: Recent Labs  Lab 04/24/24 1239 04/25/24 0328 04/26/24 0324 04/26/24 1922 04/27/24 0337  NA 143 140 142 140 141  K 3.5 4.7 4.1 4.2 3.7  CL 107 107 109 106 110  CO2 20* 25 22 23 22   GLUCOSE 122* 130* 118* 113* 105*  BUN 19 20 22  24*  24*  CREATININE 0.89 0.99 0.85 0.91 0.90  CALCIUM  8.7* 8.6* 8.4* 8.0* 8.3*    GFR: Estimated Creatinine Clearance: 64.1 mL/min (by C-G formula based on SCr of 0.9 mg/dL).  Liver Function Tests: Recent Labs  Lab 04/26/24 1922  AST 53*  ALT 24  ALKPHOS 94  BILITOT 1.8*  PROT 6.1*  ALBUMIN 3.1*    CBG: No results for input(s): "GLUCAP" in the last 168 hours.   Recent Results (from the past 240 hours)  Urine Culture (for pregnant, neutropenic or urologic patients or patients with an indwelling urinary catheter)     Status: None   Collection Time: 04/25/24  2:27 PM   Specimen: Urine, Clean Catch  Result Value Ref Range Status   Specimen Description   Final    URINE, CLEAN CATCH Performed at [facility], [address], [place], [place] [address]    Special Requests   Final    NONE Performed at [facility],  [address] [place], [place] [address]    Culture   Final    NO GROWTH Performed at [facility], [address] [place], [place] [address]    Report Status 04/26/2024 FINAL  Final         Radiology Studies: DG CHEST PORT 1 VIEW Result Date: 04/26/2024 CLINICAL DATA:  Altered mental status. EXAM: PORTABLE CHEST 1 VIEW COMPARISON:  Chest x-ray 04/24/2024 and older. FINDINGS: Enlarged cardiopericardial silhouette. Underinflation. Vascular congestion with increasing interstitial changes along the lower lung zones, right greater than left. Infiltrate versus edema. Possible effusions. No pneumothorax. Interface density change running vertically laterally along the left hemithorax very well could be sequela of overlapping shadow. This was seen on previous examination as well. Osteopenia degenerative changes. IMPRESSION: Underinflation. Enlarged cardiopericardial silhouette with vascular congestion. Interstitial changes seen along the lung bases. Edema versus subtle infiltrate. Question small effusions. Recommend follow-up.  Electronically Signed   By: Adrianna Horde M.D.   On: 04/26/2024 19:23         Scheduled Meds:  donepezil   5 mg Oral Daily   enoxaparin  (LOVENOX ) injection  40 mg Subcutaneous Q24H   fluticasone  furoate-vilanterol  1 puff Inhalation Daily   liver oil-zinc  oxide   Topical BID   pantoprazole  (PROTONIX ) IV  40 mg Intravenous QHS   umeclidinium bromide   1 puff Inhalation Daily   Continuous Infusions:  sodium chloride  75 mL/hr at 04/27/24 1329   acetaminophen      ampicillin -sulbactam (UNASYN ) IV 3 g (04/27/24 1649)      LOS: 3 days    Time spent: 40 minutes    Hilda Lovings, MD [facility]   To contact the attending provider between 7A-7P or the covering provider during after hours 7P-7A, please log into the web site [email or web address] and access using universal [place] password for that web site. If you do not have the password, please call the hospital operator.  04/27/2024, 5:28 PM

## 2024-04-27 NOTE — Evaluation (Signed)
 Clinical/Bedside Swallow Evaluation Patient Details  Name: Wayne Walls MRN: [id number] Date of Birth: 11-23-1933  Today's Date: 04/27/2024 Time: SLP Start Time (ACUTE ONLY): 8295 SLP Stop Time (ACUTE ONLY): 0906 SLP Time Calculation (min) (ACUTE ONLY): 11 min  Past Medical History:  Past Medical History:  Diagnosis Date   COPD (chronic obstructive pulmonary disease) (HCC)    Dementia (HCC)    Depression    Osteoarthrosis, unspecified whether generalized or localized, unspecified site    Prostate cancer (HCC)    Syncope    Carotids, cardiology   Unspecified glaucoma(365.9)    Unspecified sleep apnea    Chronic   Past Surgical History:  Past Surgical History:  Procedure Laterality Date   HERNIA REPAIR     HPI:  Patient is an 89 y.o. male with PMH: advanced dementia, COPD, depression. He presented to the hospital on 04/24/24 from his memory care facility after being found down. He suffered a left femoral neck fracture. He has been lethargic. RN noted him to have some coughing with PO's on 04/26/24 and he was made NPO pending SLP swallow evaluation.    Assessment / Plan / Recommendation  Clinical Impression  Patient is presenting with clinical s/s of dysphagia as per this bedside swallow evaluation. He was awake but required cues to maintain adequate alertness and appears weak. He accepted small ice chips, spoon sips of water and two bites of applesauce./ Mastication was reduced, he had poor bilabial seal around spoon and trace to minimal PO residuals of puree remained on lingual surface, clearing with subsequent swallow. No overt s/s aspiration but patient is at a high risk of aspiration at this time. Recommendation is to continue NPO status, continue ice chips PRN and necessary meds crushed in puree. SLP Visit Diagnosis: Dysphagia, unspecified (R13.10)    Aspiration Risk  Mild aspiration risk;Risk for inadequate nutrition/hydration    Diet Recommendation NPO;NPO except meds;Ice  chips PRN after oral care    Medication Administration: Crushed with puree Supervision: Full supervision/cueing for compensatory strategies;Staff to assist with self feeding Postural Changes: Seated upright at 90 degrees    Other  Recommendations Oral Care Recommendations: Oral care BID;Oral care prior to ice chip/H20    Recommendations for follow up therapy are one component of a multi-disciplinary discharge planning process, led by the attending physician.  Recommendations may be updated based on patient status, additional functional criteria and insurance authorization.  Follow up Recommendations Other (comment) (TBD pending GOC)      Assistance Recommended at Discharge    Functional Status Assessment Patient has had a recent decline in their functional status and demonstrates the ability to make significant improvements in function in a reasonable and predictable amount of time.  Frequency and Duration min 2x/week  1 week       Prognosis Prognosis for improved oropharyngeal function: Fair Barriers to Reach Goals: Cognitive deficits      Swallow Study   General Date of Onset: 04/26/24 HPI: Patient is an 89 y.o. male with PMH: advanced dementia, COPD, depression. He presented to the hospital on 04/24/24 from his memory care facility after being found down. He suffered a left femoral neck fracture. He has been lethargic. RN noted him to have some coughing with PO's on 04/26/24 and he was made NPO pending SLP swallow evaluation. Type of Study: Bedside Swallow Evaluation Previous Swallow Assessment: BSE 2022 Diet Prior to this Study: NPO Temperature Spikes Noted: No Respiratory Status: Room air History of Recent Intubation: No Behavior/Cognition: Lethargic/Drowsy;Confused;Requires  cueing Oral Cavity Assessment: Dry Oral Care Completed by SLP: Yes Oral Cavity - Dentition: Adequate natural dentition Self-Feeding Abilities: Total assist Patient Positioning: Upright in bed Baseline  Vocal Quality: Low vocal intensity Volitional Cough: Cognitively unable to elicit Volitional Swallow: Able to elicit    Oral/Motor/Sensory Function Overall Oral Motor/Sensory Function: Within functional limits   Ice Chips Ice chips: Impaired Oral Phase Impairments: Impaired mastication;Reduced lingual movement/coordination Pharyngeal Phase Impairments: Suspected delayed Swallow   Thin Liquid Thin Liquid: Impaired Oral Phase Impairments: Poor awareness of bolus;Reduced lingual movement/coordination;Other (comment) Pharyngeal  Phase Impairments: Suspected delayed Swallow    Nectar Thick     Honey Thick     Puree Puree: Impaired Oral Phase Impairments: Reduced lingual movement/coordination Oral Phase Functional Implications: Oral residue   Solid     Solid: Not tested      Wayne Mater, MA, CCC-SLP Speech Therapy

## 2024-04-27 NOTE — Progress Notes (Signed)
 Attempted to give patient's scheduled pills crushed in small amount of apple sauce. He swallowed less than half after multiple cues to swallow. PRN dose of Morphine IV administered to alleviate pain and to promote comfort during personal care.   Palliative Team contacted this am per son's request. Family trying to decide whether or not to pursue surgery. Dr. Lucienne Ryder presented to bedside during care episode and will reserve OR spot for patient until family determines.  Ara Knee, RN 04/27/24 10:10 AM

## 2024-04-27 NOTE — TOC Initial Note (Signed)
 Transition of Care [facility]) - Initial/Assessment Note    Patient Details  Name: Wayne Walls MRN: [id number] Date of Birth: [birth year]  Transition of Care [facility]) CM/SW Contact:    Wayne Leys, RN Phone Number: 04/27/2024, 3:15 PM  Clinical Narrative:    Met with pt and family at bedside to introduce role of TOC/NCM and review for dc planning,   pt's son, Wayne Walls, answered assessment questions, confirmed pt has an established PCP, reports patient with hx of Dementia, resides at [facility] unit with plans to return at discharge, Wayne Walls reports family has decided to proceed with surgery of left hip which is scheduled for tomorrow 5/27. NCM explained that patient will likely be seen by PT postoperatively with recommendations, Wayne Walls voiced understanding. TOC will continue to follow.                Barriers to Discharge: Continued Medical Work up   Patient Goals and CMS Choice            Expected Discharge Plan and Services                                              Prior Living Arrangements/Services                       Activities of Daily Living   ADL Screening (condition at time of admission) Independently performs ADLs?: No Does the patient have a NEW difficulty with bathing/dressing/toileting/self-feeding that is expected to last >3 days?: Yes (Initiates electronic notice to provider for possible OT consult) Does the patient have a NEW difficulty with getting in/out of bed, walking, or climbing stairs that is expected to last >3 days?: Yes (Initiates electronic notice to provider for possible PT consult) Does the patient have a NEW difficulty with communication that is expected to last >3 days?: No (hx of dementia) Is the patient deaf or have difficulty hearing?: No Does the patient have difficulty seeing, even when wearing glasses/contacts?: No Does the patient have difficulty concentrating, remembering, or making decisions?: Yes  Permission  Sought/Granted                  Emotional Assessment              Admission diagnosis:  Closed fracture of left hip, initial encounter (HCC) [S72.002A] Closed left hip fracture, initial encounter (HCC) [S72.002A] Patient Active Problem List   Diagnosis Date Noted   Palliative care encounter 04/27/2024   Goals of care, counseling/discussion 04/27/2024   DNR (do not resuscitate) 04/27/2024   Counseling and coordination of care 04/27/2024   Delirium 04/27/2024   Need for emotional support 04/27/2024   Closed fracture of left hip (HCC) 04/27/2024   Acute metabolic encephalopathy 04/26/2024   Dehydration 04/26/2024   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   Closed left hip fracture, initial encounter (HCC) 04/24/2024   Dementia without behavioral disturbance (HCC) 08/24/2021   Gait abnormality 08/24/2021   Stroke (HCC) 12/19/2020   Frequent falls 12/19/2020   Dysphagia 12/19/2020   Polycythemia 12/19/2020   Facial droop    Acute respiratory failure with hypoxia (HCC)    Malignant neoplasm of prostate (HCC) 01/26/2019   Elevated prostate specific antigen (PSA) 01/04/2019   Collagenous colitis 07/23/2018   Diverticulosis 06/09/2018   History of adenomatous polyp of colon 06/09/2018  Internal hemorrhoids 06/09/2018   Melena 10/31/2017   OSA (obstructive sleep apnea) 07/13/2015   Syncope 10/22/2013   PCP:  Wayne Walls., FNP Pharmacy:  No Pharmacies Listed    Social Drivers of Health (SDOH) Social History: SDOH Screenings   Food Insecurity: Patient Unable To Answer (04/24/2024)  Housing: Patient Unable To Answer (04/24/2024)  Transportation Needs: Patient Unable To Answer (04/24/2024)  Utilities: Patient Unable To Answer (04/24/2024)  Social Connections: Patient Unable To Answer (04/24/2024)  Tobacco Use: Low Risk  (04/24/2024)   SDOH Interventions:     Readmission Risk Interventions    04/27/2024    3:14 PM  Readmission Risk Prevention Plan   Transportation Screening Complete  PCP or Specialist Appt within 3-5 Days Complete  HRI or Home Care Consult Complete  Social Work Consult for Recovery Care Planning/Counseling Complete  Palliative Care Screening Complete  Medication Review Oceanographer) Complete

## 2024-04-27 NOTE — Progress Notes (Signed)
 Patient ID: Wayne Walls, male   DOB: 02-Jun-1933, 89 y.o.   MRN: [id number] I have posted the patient for surgery tomorrow (Tuesday) for a left hip hemiarthroplasty to treat his displaced left hip femoral neck fracture.  Surgery will likely not be until late in the day given the OR schedule.  He should still be n.p.o. after midnight tonight in anticipation of surgery later tomorrow.  Will know more in the morning in terms of what the timing may be for surgery.  This is certainly for pain control and quality of life issues.

## 2024-04-27 NOTE — Progress Notes (Signed)
 Patient refused EKG last night, stated repeatedly "No, I don't want that", U/A and culture sent, safety mitts able to be left off last night, Patient kept NPO per order until seen by Speech.

## 2024-04-27 NOTE — Consult Note (Signed)
 Consultation Note Date: 04/27/2024   Patient Name: Wayne Walls  DOB: [birth year]  MRN: [id number]  Age / Sex: 89 y.o., male   PCP: Shannan Dart., FNP Referring Physician: Armenta Landau, MD  Reason for Consultation: Establishing goals of care     Chief Complaint/History of Present Illness:   Patient is an 89 year old male with a past medical history of vascular dementia, COPD, and depression who was admitted on 04/24/2024 after a fall at memory care unit.  Imaging upon admission showing left femoral neck fracture.  Orthopedics consulted for evaluation.  Palliative medicine team consulted to assist with complex medical decision making.  Extensive review of EMR prior to presenting to bedside.  Personally reviewed left hip and pelvis x-ray noting acute fracture.  Reviewed recent CMP noting creatinine 0.9, BUN 24, and so GFR >60.  Patient noted to have albumin of 3.1. Patient's family has been debating whether or not to proceed with left hip hemiarthroplasty.  Presented to bedside to see patient.  Bedside RNs present.  Able to discuss care at that time.  Patient more lethargic today and having difficulties with swallowing crushed pills.  Patient would awaken to voice and attempt to answer simple questions though would quickly fall back asleep. Son had already called this morning to speak with RN about updates.  Noted would reach out to son at this time.  Able to call patient's son, Wayne Walls.  Introduced myself as a member of the palliative medicine team and my role in patient's medical journey.  Spent time learning about patient's medical journey up into this point.  A week ago patient was ambulating with a rollator in his memory care unit.  Patient was able to transfer himself and participate in multiple ADLs.  Patient had recently gone on an outing in a pontoon boat to enjoy time with his group.  Patient would have good and bad things related to his memory though overall based on his current  medical status, son described patient did have quality of life for what he could achieve with known dementia.  Son stated patient has not had a fall in the past 6 months.  Acknowledges this.  Son also stated that when patient was being moved between bed or attempts to have movement with his current fracture, patient is yelling in agony which is difficult to see since that is "not his father "who acts like that.  Empathized with difficult situation.  Able to discuss pathways for medical care moving forward.  Able to discuss pursuing surgical intervention and generalities of what this would entail.  Able to discuss benefits and risk associated with surgical intervention.  Patient has delirium in setting of underlying vascular dementia.  Noted that when patients have dementia, anesthesia and hospitalization can worsen patient's mentation.  Patient may never reach the baseline he was at previously.  Did discuss how surgical interventions would be for essentially palliative comfort at this time so patient could be cleaned and maintaining dignity without agonizing pain.  Noted if surgery was not pursued, would essentially need continuous pain medications to help with management and focus on patient's comfort at the end of life.  Based on son's description of patient, patient could potentially have more time if fracture was mended. Discussed that should patient go through with surgery, may have decline even after surgery.  Noted patient may or may not be able to work with physical therapy based on his dementia.  Discussed patient can have worsening aspiration risk.  Son acknowledged this and noted that prior to this event, patient was not having aspiration issues.  His wife is a retired Doctor, general practice and so they have been monitoring this.  Acknowledged this.  Noted patient can develop aspiration issues with his multiple medical conditions currently happening.  Discussed that should patient's ability to maintain his  oral intake decrease, would not recommend that feeding tube would be considered quality of life.  Son acknowledges and noted patient would not want a feeding tube.  Discussed then that if this became an issue, goal would be to allow patient to eat for comfort knowing he could aspirate and that could lead to end-of-life.  Son acknowledged this.  Also discussed that should patient make it through the surgery, if he deteriorates, could then focus on full comfort measures and pursue hospice support.  Son acknowledged this as well. After discussion, son noting plan to proceed with surgery to assist with pain management at this time.  Noted would inform care team of this decision.  Also able to discuss patient's CODE STATUS.  Patient is currently listed as full code.  Son immediately stated that patient should be DNR/DNI.  Noted would appropriately change CODE STATUS at this time to DNR/DNI.  Also explained that to undergo surgery, for the surgical.  His CODE STATUS would be changed to full code.  Explained that after surgery, could return to DNR/DNI.  Discussed surgery usually requires use of ventilator for short period of time.  Acknowledged the patient would not want to be on long-term ventilator support.  Discussed that after surgery plan would be to extubate when appropriate, and would not plan to reintubate even if respiratory status worsens.  Son acknowledges this.  Son provided contact information for his sister which was not in EMR so placed in demographics.  Spent time answering questions as able.  Provided emotional support via active listening.  Noted palliative medicine team continue to follow with patient's medical journey.  Updated care team including hospitalist, RN, and SLP regarding decision.  Orthopedist not available via chat though RN able to inform orthopedist about planning to proceed with surgery.  Primary Diagnoses  Present on Admission:  Closed left hip fracture, initial encounter  (HCC)  Dementia without behavioral disturbance (HCC)  COPD with emphysema (HCC)  Dehydration   Past Medical History:  Diagnosis Date   COPD (chronic obstructive pulmonary disease) (HCC)    Dementia (HCC)    Depression    Osteoarthrosis, unspecified whether generalized or localized, unspecified site    Prostate cancer (HCC)    Syncope    Carotids, cardiology   Unspecified glaucoma(365.9)    Unspecified sleep apnea    Chronic   Social History   Socioeconomic History   Marital status: Married    Spouse name: Not on file   Number of children: 2   Years of education: Not on file   Highest education level: Not on file  Occupational History    Comment: retired  Tobacco Use   Smoking status: Never   Smokeless tobacco: Never  Vaping Use   Vaping status: Never Used  Substance and Sexual Activity   Alcohol  use: Not Currently   Drug use: Never   Sexual activity: Not Currently  Other Topics Concern   Not on file  Social History Narrative   Second marriage. Has two grown children. One child resides in [place].    Social Drivers of Corporate investment banker Strain: Not on file  Food Insecurity: Patient  Unable To Answer (04/24/2024)   Hunger Vital Sign    Worried About Running Out of Food in the Last Year: Patient unable to answer    Ran Out of Food in the Last Year: Patient unable to answer  Transportation Needs: Patient Unable To Answer (04/24/2024)   PRAPARE - Transportation    Lack of Transportation (Medical): Patient unable to answer    Lack of Transportation (Non-Medical): Patient unable to answer  Physical Activity: Not on file  Stress: Not on file  Social Connections: Patient Unable To Answer (04/24/2024)   Social Connection and Isolation Panel [NHANES]    Frequency of Communication with Friends and Family: Patient unable to answer    Frequency of Social Gatherings with Friends and Family: Patient unable to answer    Attends Religious Services: Patient unable to  answer    Active Member of Clubs or Organizations: Patient unable to answer    Attends Banker Meetings: Patient unable to answer    Marital Status: Patient unable to answer   Family History  Problem Relation Age of Onset   Heart disease Mother    Hypertension Mother    Breast cancer Sister    Prostate cancer Neg Hx    Colon cancer Neg Hx    Pancreatic cancer Neg Hx    Scheduled Meds:  acetaminophen  500 mg Oral TID   donepezil   5 mg Oral Daily   enoxaparin  (LOVENOX ) injection  40 mg Subcutaneous Q24H   fluticasone furoate-vilanterol  1 puff Inhalation Daily   liver oil-zinc oxide   Topical BID   pantoprazole  (PROTONIX ) IV  40 mg Intravenous QHS   umeclidinium bromide  1 puff Inhalation Daily   Continuous Infusions:  sodium chloride  75 mL/hr at 04/27/24 0744   PRN Meds:.acetaminophen **OR** acetaminophen, haloperidol, ipratropium-albuterol , morphine injection, nystatin cream, ondansetron **OR** ondansetron (ZOFRAN) IV, mouth rinse, oxyCODONE, polyethylene glycol, senna-docusate No Known Allergies CBC:    Component Value Date/Time   WBC 8.7 04/27/2024 0337   HGB 14.3 04/27/2024 0337   HGB 18.5 (H) 10/15/2017 1508   HCT 46.1 04/27/2024 0337   HCT 53.2 (H) 10/15/2017 1508   PLT 163 04/27/2024 0337   PLT 140 10/15/2017 1508   MCV 105.3 (H) 04/27/2024 0337   MCV 100.6 (H) 10/15/2017 1508   NEUTROABS 8.3 (H) 04/26/2024 1922   NEUTROABS 7.3 (H) 10/15/2017 1508   LYMPHSABS 0.7 04/26/2024 1922   LYMPHSABS 1.6 10/15/2017 1508   MONOABS 0.9 04/26/2024 1922   MONOABS 0.8 10/15/2017 1508   EOSABS 0.0 04/26/2024 1922   EOSABS 0.4 10/15/2017 1508   BASOSABS 0.0 04/26/2024 1922   BASOSABS 0.1 10/15/2017 1508   Comprehensive Metabolic Panel:    Component Value Date/Time   NA 141 04/27/2024 0337   K 3.7 04/27/2024 0337   CL 110 04/27/2024 0337   CO2 22 04/27/2024 0337   BUN 24 (H) 04/27/2024 0337   CREATININE 0.90 04/27/2024 0337   GLUCOSE 105 (H) 04/27/2024  0337   CALCIUM  8.3 (L) 04/27/2024 0337   AST 53 (H) 04/26/2024 1922   ALT 24 04/26/2024 1922   ALKPHOS 94 04/26/2024 1922   BILITOT 1.8 (H) 04/26/2024 1922   PROT 6.1 (L) 04/26/2024 1922   ALBUMIN 3.1 (L) 04/26/2024 1922    Physical Exam: Vital Signs: BP 133/86 (BP Location: Right Arm)   Pulse 90   Temp (!) 97.5 F (36.4 C) (Oral)   Resp 18   Ht 6\' 1"  (1.854 m)   Wt 80.3  kg   SpO2 95%   BMI 23.36 kg/m  SpO2: SpO2: 95 % O2 Device: O2 Device: Nasal Cannula O2 Flow Rate: O2 Flow Rate (L/min): 3 L/min Intake/output summary:  Intake/Output Summary (Last 24 hours) at 04/27/2024 0748 Last data filed at 04/27/2024 1610 Gross per 24 hour  Intake 1165.66 ml  Output 550 ml  Net 615.66 ml   LBM: Last BM Date : 04/25/24 Baseline Weight: Weight: 80.3 kg Most recent weight: Weight: 80.3 kg  General: Lethargic, chronically ill-appearing, frail Cardiovascular: RRR Respiratory: no increased work of breathing noted, not in respiratory distress Extremities: Muscle wasting in all extremities with temporal wasting bilaterally Neuro: Lethargic, will awaken to answer short questions at times          Palliative Performance Scale: 10%              Additional Data Reviewed: Recent Labs    04/26/24 1922 04/27/24 0337  WBC 10.0 8.7  HGB 14.2 14.3  PLT 169 163  NA 140 141  BUN 24* 24*  CREATININE 0.91 0.90    Imaging: DG CHEST PORT 1 VIEW CLINICAL DATA:  Altered mental status.  EXAM: PORTABLE CHEST 1 VIEW  COMPARISON:  Chest x-ray 04/24/2024 and older.  FINDINGS: Enlarged cardiopericardial silhouette. Underinflation. Vascular congestion with increasing interstitial changes along the lower lung zones, right greater than left. Infiltrate versus edema. Possible effusions. No pneumothorax. Interface density change running vertically laterally along the left hemithorax very well could be sequela of overlapping shadow. This was seen on previous examination as well. Osteopenia  degenerative changes.  IMPRESSION: Underinflation. Enlarged cardiopericardial silhouette with vascular congestion. Interstitial changes seen along the lung bases. Edema versus subtle infiltrate. Question small effusions. Recommend follow-up.  Electronically Signed   By: Adrianna Horde M.D.   On: 04/26/2024 19:23    I personally reviewed recent imaging.   Palliative Care Assessment and Plan Summary of Established Goals of Care and Medical Treatment Preferences   Patient is an 89 year old male with a past medical history of vascular dementia, COPD, and depression who was admitted on 04/24/2024 after a fall at memory care unit.  Imaging upon admission showing left femoral neck fracture.  Orthopedics consulted for evaluation.  Palliative medicine team consulted to assist with complex medical decision making.  # Complex medical decision making/goals of care  - Patient unable to participate in complex medical decision making based on medical status.  - Able to discuss care plan with patient's son, Wayne Walls.  Wayne Walls noted that he, his Wayne Walls, and patient's wife Wayne Walls (Mark's step-mother) are patient's HCPOA.  Wayne Walls will work to get paperwork that states this to hospital to be placed into EMR.  Wayne Walls is discussing patient's care with patient's wife and his sister.  Discussed pathways for medical care moving forward.  Patient was ambulating a week ago.  Discussed proceeding with surgical intervention for fracture versus focusing on comfort.  Concerned that patient could not receive optimal comfort without surgical intervention as would essentially require continuous sedation as has agonizing pain with any movement.  Discussed risk and benefits of surgery.  At this time decision made to proceed with surgery understanding that patient could have acute deterioration in setting of delirium with known underlying vascular dementia.  Already discussed that should patient's aspiration status worsen, interventions  such as feeding tubes would not lead to quality of life outcomes so family would not want this and would allow patient to eat for comfort.  Discussed patient may or may not  be able to participate with therapy after surgery though hope he can at least be comfortable with receiving care in bed for management of cleanliness and dignity.  And should patient continue deteriorate, may need to consider hospice involvement.  Palliative medicine team to continue following along with patient's medical journey and engage in conversations as able and appropriate.  -  Code Status: Limited: Do not attempt resuscitation (DNR) -DNR-LIMITED -Do Not Intubate/DNI     - Reviewed patient's CODE STATUS with son Wayne Walls.  Wayne Walls stated that patient is to be DNR/DNI.  Noted patient would be changed to full code for surgery though could be reverted back to DNR/DNI after surgery.  Son does not want patient to undergo cardiac or respiratory resuscitation.  Acknowledges that patient may need intubation for surgery though would want extubated as patient would not want to be on long-term life support.  Acknowledged this and appropriately change CODE STATUS to DNR/DNI.  # Psycho-social/Spiritual Support:  - Support System: Wife (children's stepmother), son, daughter  # Discharge Planning:  To Be Determined  Thank you for allowing the palliative care team to participate in the care Wayne Walls.  Barnett Libel, DO Palliative Care Provider PMT # [phone]  If patient remains symptomatic despite maximum doses, please call PMT at [phone] between 0700 and 1900. Outside of these hours, please call attending, as PMT does not have night coverage.  Personally spent 95 minutes in patient care including extensive chart review (labs, imaging, progress/consult notes, vital signs), medically appropraite exam, discussed with treatment team, education to patient, family, and staff, documenting clinical information, medication review and  management, coordination of care, and available advanced directive documents.

## 2024-04-27 NOTE — Plan of Care (Signed)
   Problem: Coping: Goal: Level of anxiety will decrease Outcome: Progressing   Problem: Pain Managment: Goal: General experience of comfort will improve and/or be controlled Outcome: Progressing   Problem: Safety: Goal: Ability to remain free from injury will improve Outcome: Progressing

## 2024-04-28 ENCOUNTER — Encounter (HOSPITAL_COMMUNITY)
Admission: EM | Disposition: A | Discharge status: Hospice | Payer: Self-pay | Source: Home / Self Care | Attending: Internal Medicine

## 2024-04-28 ENCOUNTER — Inpatient Hospital Stay (HOSPITAL_COMMUNITY)

## 2024-04-28 ENCOUNTER — Encounter (HOSPITAL_COMMUNITY): Payer: Self-pay | Admitting: Internal Medicine

## 2024-04-28 DIAGNOSIS — S72002A Fracture of unspecified part of neck of left femur, initial encounter for closed fracture: Secondary | ICD-10-CM

## 2024-04-28 DIAGNOSIS — F339 Major depressive disorder, recurrent, unspecified: Secondary | ICD-10-CM

## 2024-04-28 DIAGNOSIS — J449 Chronic obstructive pulmonary disease, unspecified: Secondary | ICD-10-CM | POA: Diagnosis not present

## 2024-04-28 DIAGNOSIS — F0390 Unspecified dementia without behavioral disturbance: Secondary | ICD-10-CM | POA: Diagnosis not present

## 2024-04-28 DIAGNOSIS — G4733 Obstructive sleep apnea (adult) (pediatric): Secondary | ICD-10-CM | POA: Diagnosis not present

## 2024-04-28 DIAGNOSIS — E860 Dehydration: Secondary | ICD-10-CM | POA: Diagnosis not present

## 2024-04-28 DIAGNOSIS — G9341 Metabolic encephalopathy: Secondary | ICD-10-CM | POA: Diagnosis not present

## 2024-04-28 HISTORY — PX: TOTAL HIP ARTHROPLASTY: SHX124

## 2024-04-28 LAB — SURGICAL PCR SCREEN
MRSA, PCR: NEGATIVE
Staphylococcus aureus: POSITIVE — AB

## 2024-04-28 LAB — URINE CULTURE: Culture: NO GROWTH

## 2024-04-28 LAB — BASIC METABOLIC PANEL WITH GFR
Anion gap: 7 (ref 5–15)
BUN: 32 mg/dL — ABNORMAL HIGH (ref 8–23)
CO2: 20 mmol/L — ABNORMAL LOW (ref 22–32)
Calcium: 7.7 mg/dL — ABNORMAL LOW (ref 8.9–10.3)
Chloride: 112 mmol/L — ABNORMAL HIGH (ref 98–111)
Creatinine, Ser: 0.93 mg/dL (ref 0.61–1.24)
GFR, Estimated: >60 mL/min (ref >60)
Glucose, Bld: 115 mg/dL — ABNORMAL HIGH (ref 70–99)
Potassium: 3.9 mmol/L (ref 3.5–5.1)
Sodium: 139 mmol/L (ref 135–145)

## 2024-04-28 LAB — CBC
HCT: 43.2 % (ref 39.0–52.0)
Hemoglobin: 13.4 g/dL (ref 13.0–17.0)
MCH: 32.7 pg (ref 26.0–34.0)
MCHC: 31 g/dL (ref 30.0–36.0)
MCV: 105.4 fL — ABNORMAL HIGH (ref 80.0–100.0)
Platelets: 166 10*3/uL (ref 150–400)
RBC: 4.1 MIL/uL — ABNORMAL LOW (ref 4.22–5.81)
RDW: 13.7 % (ref 11.5–15.5)
WBC: 7.7 10*3/uL (ref 4.0–10.5)
nRBC: 0 % (ref 0.0–0.2)

## 2024-04-28 LAB — TYPE AND SCREEN
ABO/RH(D): O POS
Antibody Screen: NEGATIVE

## 2024-04-28 SURGERY — ARTHROPLASTY, HIP, TOTAL, ANTERIOR APPROACH
Anesthesia: General | Site: Hip | Laterality: Left

## 2024-04-28 MED ORDER — MIDAZOLAM HCL 2 MG/2ML IJ SOLN: 0.5000 mg | Freq: Once | INTRAMUSCULAR | Status: DC | PRN

## 2024-04-28 MED ORDER — PROPOFOL 10 MG/ML IV BOLUS
INTRAVENOUS | Status: AC
  Filled 2024-04-28: qty 20

## 2024-04-28 MED ORDER — ROCURONIUM BROMIDE 10 MG/ML (PF) SYRINGE
PREFILLED_SYRINGE | INTRAVENOUS | Status: AC
  Filled 2024-04-28: qty 10

## 2024-04-28 MED ORDER — SUGAMMADEX SODIUM 200 MG/2ML IV SOLN
INTRAVENOUS | Status: DC | PRN
  Administered 2024-04-28: 200 mg via INTRAVENOUS

## 2024-04-28 MED ORDER — ASPIRIN 325 MG PO TBEC
325.0000 mg | DELAYED_RELEASE_TABLET | Freq: Every day | ORAL | Status: DC
  Administered 2024-04-30: 325 mg via ORAL
  Filled 2024-04-28: qty 1

## 2024-04-28 MED ORDER — CHLORHEXIDINE GLUCONATE 4 % EX SOLN: 1.0000 | CUTANEOUS | 1 refills | Status: AC

## 2024-04-28 MED ORDER — LIDOCAINE HCL (PF) 2 % IJ SOLN
INTRAMUSCULAR | Status: AC
  Filled 2024-04-28: qty 5

## 2024-04-28 MED ORDER — OXYCODONE HCL 5 MG/5ML PO SOLN: 5.0000 mg | Freq: Once | ORAL | Status: DC | PRN

## 2024-04-28 MED ORDER — LACTATED RINGERS IV SOLN
INTRAVENOUS | Status: DC | PRN
Start: 2024-04-28 — End: 2024-04-28

## 2024-04-28 MED ORDER — ACETAMINOPHEN 10 MG/ML IV SOLN
1000.0000 mg | Freq: Three times a day (TID) | INTRAVENOUS | Status: AC
  Administered 2024-04-28 – 2024-04-29 (×2): 1000 mg via INTRAVENOUS
  Filled 2024-04-28 (×2): qty 100

## 2024-04-28 MED ORDER — ONDANSETRON HCL 4 MG/2ML IJ SOLN
INTRAMUSCULAR | Status: AC
  Filled 2024-04-28: qty 2

## 2024-04-28 MED ORDER — PHENYLEPHRINE HCL-NACL 20-0.9 MG/250ML-% IV SOLN
INTRAVENOUS | Status: DC | PRN
Start: 2024-04-28 — End: 2024-04-28
  Administered 2024-04-28: 50 ug/min via INTRAVENOUS

## 2024-04-28 MED ORDER — SODIUM CHLORIDE 0.9 % IV SOLN: INTRAVENOUS | Status: AC

## 2024-04-28 MED ORDER — FENTANYL CITRATE (PF) 100 MCG/2ML IJ SOLN
INTRAMUSCULAR | Status: AC
  Filled 2024-04-28: qty 2

## 2024-04-28 MED ORDER — LACTATED RINGERS IV SOLN: INTRAVENOUS | Status: DC

## 2024-04-28 MED ORDER — ORAL CARE MOUTH RINSE: 15.0000 mL | Freq: Once | OROMUCOSAL | Status: DC

## 2024-04-28 MED ORDER — PHENYLEPHRINE HCL (PRESSORS) 10 MG/ML IV SOLN
INTRAVENOUS | Status: AC
  Filled 2024-04-28: qty 1

## 2024-04-28 MED ORDER — STERILE WATER FOR IRRIGATION IR SOLN
Status: DC | PRN
  Administered 2024-04-28: 2000 mL

## 2024-04-28 MED ORDER — PROPOFOL 10 MG/ML IV BOLUS
INTRAVENOUS | Status: DC | PRN
  Administered 2024-04-28: 30 mg via INTRAVENOUS
  Administered 2024-04-28: 80 mg via INTRAVENOUS

## 2024-04-28 MED ORDER — DOCUSATE SODIUM 100 MG PO CAPS
100.0000 mg | ORAL_CAPSULE | Freq: Two times a day (BID) | ORAL | Status: DC
Start: 2024-04-28 — End: 2024-05-04
  Filled 2024-04-28 (×5): qty 1

## 2024-04-28 MED ORDER — PHENYLEPHRINE 80 MCG/ML (10ML) SYRINGE FOR IV PUSH (FOR BLOOD PRESSURE SUPPORT)
PREFILLED_SYRINGE | INTRAVENOUS | Status: AC
  Filled 2024-04-28: qty 10

## 2024-04-28 MED ORDER — METOCLOPRAMIDE HCL 5 MG/ML IJ SOLN: 5.0000 mg | Freq: Three times a day (TID) | INTRAMUSCULAR | Status: DC | PRN

## 2024-04-28 MED ORDER — ONDANSETRON HCL 4 MG/2ML IJ SOLN
INTRAMUSCULAR | Status: DC | PRN
Start: 2024-04-28 — End: 2024-04-28
  Administered 2024-04-28: 4 mg via INTRAVENOUS

## 2024-04-28 MED ORDER — FENTANYL CITRATE PF 50 MCG/ML IJ SOSY: 25.0000 ug | PREFILLED_SYRINGE | INTRAMUSCULAR | Status: DC | PRN

## 2024-04-28 MED ORDER — CEFAZOLIN SODIUM-DEXTROSE 2-4 GM/100ML-% IV SOLN
2.0000 g | INTRAVENOUS | Status: AC
  Administered 2024-04-28: 2 g via INTRAVENOUS
  Filled 2024-04-28: qty 100

## 2024-04-28 MED ORDER — CHLORHEXIDINE GLUCONATE 4 % EX SOLN: 60.0000 mL | Freq: Once | CUTANEOUS | Status: DC

## 2024-04-28 MED ORDER — POVIDONE-IODINE 10 % EX SWAB
2.0000 | Freq: Once | CUTANEOUS | Status: AC
  Administered 2024-04-28: 2 via TOPICAL

## 2024-04-28 MED ORDER — FENTANYL CITRATE (PF) 250 MCG/5ML IJ SOLN
INTRAMUSCULAR | Status: AC
  Filled 2024-04-28: qty 5

## 2024-04-28 MED ORDER — TRANEXAMIC ACID-NACL 1000-0.7 MG/100ML-% IV SOLN
1000.0000 mg | INTRAVENOUS | Status: DC
  Filled 2024-04-28: qty 100

## 2024-04-28 MED ORDER — MUPIROCIN 2 % EX OINT: 1.0000 | TOPICAL_OINTMENT | Freq: Two times a day (BID) | CUTANEOUS | 0 refills | Status: DC

## 2024-04-28 MED ORDER — ESMOLOL HCL 100 MG/10ML IV SOLN
INTRAVENOUS | Status: DC | PRN
  Administered 2024-04-28 (×3): 50 mg via INTRAVENOUS

## 2024-04-28 MED ORDER — ESMOLOL HCL 100 MG/10ML IV SOLN
INTRAVENOUS | Status: AC
  Filled 2024-04-28: qty 20

## 2024-04-28 MED ORDER — MUPIROCIN 2 % EX OINT
1.0000 | TOPICAL_OINTMENT | Freq: Two times a day (BID) | CUTANEOUS | Status: AC
  Administered 2024-04-28 – 2024-05-02 (×10): 1 via NASAL
  Filled 2024-04-28: qty 22

## 2024-04-28 MED ORDER — TRANEXAMIC ACID-NACL 1000-0.7 MG/100ML-% IV SOLN
1000.0000 mg | Freq: Once | INTRAVENOUS | Status: AC
  Administered 2024-04-28: 1000 mg via INTRAVENOUS
  Filled 2024-04-28: qty 100

## 2024-04-28 MED ORDER — ESMOLOL HCL 100 MG/10ML IV SOLN
INTRAVENOUS | Status: AC
  Filled 2024-04-28: qty 10

## 2024-04-28 MED ORDER — TRANEXAMIC ACID-NACL 1000-0.7 MG/100ML-% IV SOLN
INTRAVENOUS | Status: DC | PRN
  Administered 2024-04-28: 1000 mg via INTRAVENOUS

## 2024-04-28 MED ORDER — MENTHOL 3 MG MT LOZG: 1.0000 | LOZENGE | OROMUCOSAL | Status: DC | PRN

## 2024-04-28 MED ORDER — DEXAMETHASONE SODIUM PHOSPHATE 10 MG/ML IJ SOLN
INTRAMUSCULAR | Status: AC
  Filled 2024-04-28: qty 1

## 2024-04-28 MED ORDER — PHENOL 1.4 % MT LIQD
1.0000 | OROMUCOSAL | Status: DC | PRN
Start: 2024-04-28 — End: 2024-05-06

## 2024-04-28 MED ORDER — CHLORHEXIDINE GLUCONATE 0.12 % MT SOLN: 15.0000 mL | Freq: Once | OROMUCOSAL | Status: DC

## 2024-04-28 MED ORDER — DILTIAZEM HCL-DEXTROSE 125-5 MG/125ML-% IV SOLN (PREMIX)
5.0000 mg/h | INTRAVENOUS | Status: DC
  Filled 2024-04-28: qty 125

## 2024-04-28 MED ORDER — ROCURONIUM BROMIDE 100 MG/10ML IV SOLN
INTRAVENOUS | Status: DC | PRN
  Administered 2024-04-28: 20 mg via INTRAVENOUS
  Administered 2024-04-28: 60 mg via INTRAVENOUS

## 2024-04-28 MED ORDER — PHENYLEPHRINE 80 MCG/ML (10ML) SYRINGE FOR IV PUSH (FOR BLOOD PRESSURE SUPPORT)
PREFILLED_SYRINGE | INTRAVENOUS | Status: DC | PRN
  Administered 2024-04-28: 100 ug via INTRAVENOUS
  Administered 2024-04-28 (×2): 160 ug via INTRAVENOUS
  Administered 2024-04-28: 100 ug via INTRAVENOUS

## 2024-04-28 MED ORDER — LIDOCAINE HCL (PF) 2 % IJ SOLN
INTRAMUSCULAR | Status: AC
  Filled 2024-04-28: qty 15

## 2024-04-28 MED ORDER — SODIUM CHLORIDE 0.9 % IR SOLN
Status: DC | PRN
  Administered 2024-04-28: 1000 mL

## 2024-04-28 MED ORDER — CHLORHEXIDINE GLUCONATE CLOTH 2 % EX PADS
6.0000 | MEDICATED_PAD | Freq: Every day | CUTANEOUS | Status: AC
  Administered 2024-04-28 – 2024-05-02 (×5): 6 via TOPICAL

## 2024-04-28 MED ORDER — OXYCODONE HCL 5 MG PO TABS: 5.0000 mg | ORAL_TABLET | Freq: Once | ORAL | Status: DC | PRN

## 2024-04-28 MED ORDER — DILTIAZEM LOAD VIA INFUSION
INTRAVENOUS | Status: DC | PRN
Start: 2024-04-28 — End: 2024-04-28
  Administered 2024-04-28: 10 mg via INTRAVENOUS

## 2024-04-28 MED ORDER — ACETAMINOPHEN 10 MG/ML IV SOLN
INTRAVENOUS | Status: DC | PRN
  Administered 2024-04-28: 1000 mg via INTRAVENOUS

## 2024-04-28 MED ORDER — 0.9 % SODIUM CHLORIDE (POUR BTL) OPTIME
TOPICAL | Status: DC | PRN
  Administered 2024-04-28: 1000 mL

## 2024-04-28 MED ORDER — EPHEDRINE 5 MG/ML INJ
INTRAVENOUS | Status: AC
  Filled 2024-04-28: qty 5

## 2024-04-28 MED ORDER — CEFAZOLIN SODIUM-DEXTROSE 2-4 GM/100ML-% IV SOLN
2.0000 g | Freq: Four times a day (QID) | INTRAVENOUS | Status: AC
  Administered 2024-04-28 – 2024-04-29 (×2): 2 g via INTRAVENOUS
  Filled 2024-04-28 (×2): qty 100

## 2024-04-28 MED ORDER — FENTANYL CITRATE (PF) 100 MCG/2ML IJ SOLN
INTRAMUSCULAR | Status: DC | PRN
  Administered 2024-04-28 (×2): 50 ug via INTRAVENOUS

## 2024-04-28 MED ORDER — METOCLOPRAMIDE HCL 5 MG PO TABS: 5.0000 mg | ORAL_TABLET | Freq: Three times a day (TID) | ORAL | Status: DC | PRN

## 2024-04-28 SURGICAL SUPPLY — 29 items
BAG COUNTER SPONGE SURGICOUNT (BAG) ×1 IMPLANT
BLADE SAW SGTL 18X1.27X75 (BLADE) ×1 IMPLANT
COVER PERINEAL POST (MISCELLANEOUS) ×1 IMPLANT
COVER SURGICAL LIGHT HANDLE (MISCELLANEOUS) ×1 IMPLANT
DRAPE FOOT SWITCH (DRAPES) ×1 IMPLANT
DRAPE STERI IOBAN 125X83 (DRAPES) ×1 IMPLANT
DRAPE U-SHAPE 47X51 STRL (DRAPES) ×2 IMPLANT
DRSG AQUACEL AG ADV 3.5X10 (GAUZE/BANDAGES/DRESSINGS) ×2 IMPLANT
DURAPREP 26ML APPLICATOR (WOUND CARE) ×1 IMPLANT
ELECT PENCIL ROCKER SW 15FT (MISCELLANEOUS) ×1 IMPLANT
ELECT REM PT RETURN 15FT ADLT (MISCELLANEOUS) ×1 IMPLANT
GLOVE BIO SURGEON STRL SZ7.5 (GLOVE) ×1 IMPLANT
GLOVE BIO SURGEON STRL SZ8 (GLOVE) ×1 IMPLANT
GLOVE BIOGEL PI IND STRL 8 (GLOVE) ×1 IMPLANT
GOWN STRL REUS W/ TWL XL LVL3 (GOWN DISPOSABLE) ×2 IMPLANT
HEAD BIPOLAR AML DEPUY 56 (Hips) ×1 IMPLANT
HEAD FEM STD 28X+1.5 STRL (Hips) ×1 IMPLANT
HOLDER FOLEY CATH W/STRAP (MISCELLANEOUS) ×1 IMPLANT
KIT TURNOVER KIT A (KITS) ×1 IMPLANT
PACK ANTERIOR HIP CUSTOM (KITS) ×1 IMPLANT
SET HNDPC FAN SPRY TIP SCT (DISPOSABLE) ×1 IMPLANT
STAPLER SKIN PROX 35W (STAPLE) ×1 IMPLANT
STEM FEM ACTIS HIGH SZ8 (Stem) ×1 IMPLANT
SUT ETHIBOND NAB CT1 #1 30IN (SUTURE) ×1 IMPLANT
SUT MNCRL AB 4-0 PS2 18 (SUTURE) IMPLANT
SUT VIC AB 0 CT1 36 (SUTURE) ×1 IMPLANT
SUT VIC AB 1 CT1 36 (SUTURE) ×1 IMPLANT
SUT VIC AB 2-0 CT1 TAPERPNT 27 (SUTURE) ×2 IMPLANT
YANKAUER SUCT BULB TIP NO VENT (SUCTIONS) ×1 IMPLANT

## 2024-04-28 NOTE — Progress Notes (Signed)
 SLP Cancellation Note  Patient Details Name: Wayne Walls MRN: [id number] DOB: 06-Oct-1933   Cancelled treatment:       Reason Eval/Treat Not Completed: Other (comment);Patient at procedure or test/unavailable (pt npo today for surgery and is in PACU, will follow up next date)  Maudie Sorrow, MS [facility] SLP Acute Rehab Services Office [phone]   Chantal Comment 04/28/2024, 5:23 PM

## 2024-04-28 NOTE — Progress Notes (Signed)
 Daily Progress Note   Patient Name: Wayne Walls       Date: 04/28/2024 DOB: 06/30/1933  Age: 89 y.o. MRN#: [id number] Attending Physician: Armenta Landau, MD Primary Care Physician: Shannan Dart., FNP Admit Date: 04/24/2024  Reason for Consultation/Follow-up: Establishing goals of care  Subjective: Resting in bed. Family at bedside, including son [name].  Length of Stay: 4  Current Medications: Scheduled Meds:   chlorhexidine  60 mL Topical Once   chlorhexidine  15 mL Mouth/Throat Once   Or   mouth rinse  15 mL Mouth Rinse Once   [MAR Hold] Chlorhexidine Gluconate Cloth  6 each Topical Daily   [MAR Hold] donepezil   5 mg Oral Daily   [MAR Hold] enoxaparin  (LOVENOX ) injection  40 mg Subcutaneous Q24H   [MAR Hold] fluticasone furoate-vilanterol  1 puff Inhalation Daily   [MAR Hold] liver oil-zinc oxide   Topical BID   [MAR Hold] mupirocin ointment  1 Application Nasal BID   [MAR Hold] pantoprazole  (PROTONIX ) IV  40 mg Intravenous QHS   [MAR Hold] umeclidinium bromide  1 puff Inhalation Daily    Continuous Infusions:  sodium chloride  100 mL/hr at 04/28/24 0923   acetaminophen 1,000 mg (04/28/24 0525)   [MAR Hold] ampicillin-sulbactam (UNASYN) IV 3 g (04/28/24 0926)    ceFAZolin (ANCEF) IV     lactated ringers 10 mL/hr at 04/28/24 1303   tranexamic acid      PRN Meds: [MAR Hold] acetaminophen **OR** [MAR Hold] acetaminophen, [MAR Hold] haloperidol, [MAR Hold] ipratropium-albuterol , [MAR Hold]  morphine injection, [MAR Hold] nystatin cream, [MAR Hold] ondansetron **OR** [MAR Hold] ondansetron (ZOFRAN) IV, [MAR Hold] mouth rinse, [MAR Hold] oxyCODONE, [MAR Hold] polyethylene glycol, [MAR Hold] senna-docusate  Physical Exam         Resting in bed, no distress Regular work  of breathing  Vital Signs: BP 110/67   Pulse 76   Temp 97.7 F (36.5 C) (Axillary)   Resp 18   Ht 6\' 1"  (1.854 m)   Wt 80.3 kg   SpO2 94%   BMI 23.36 kg/m  SpO2: SpO2: 94 % O2 Device: O2 Device: Nasal Cannula O2 Flow Rate: O2 Flow Rate (L/min): 1.5 L/min  Intake/output summary:  Intake/Output Summary (Last 24 hours) at 04/28/2024 1343 Last data filed at 04/28/2024 0700 Gross per 24  hour  Intake 2142.46 ml  Output 870 ml  Net 1272.46 ml   LBM: Last BM Date : 04/28/24 Baseline Weight: Weight: 80.3 kg Most recent weight: Weight: 80.3 kg       Palliative Assessment/Data:      Patient Active Problem List   Diagnosis Date Noted   Palliative care encounter 04/27/2024   Goals of care, counseling/discussion 04/27/2024   DNR (do not resuscitate) 04/27/2024   Counseling and coordination of care 04/27/2024   Delirium 04/27/2024   Need for emotional support 04/27/2024   Closed fracture of left hip (HCC) 04/27/2024   Acute metabolic encephalopathy 04/26/2024   Dehydration 04/26/2024   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   Closed left hip fracture, initial encounter (HCC) 04/24/2024   Dementia without behavioral disturbance (HCC) 08/24/2021   Gait abnormality 08/24/2021   Stroke (HCC) 12/19/2020   Frequent falls 12/19/2020   Dysphagia 12/19/2020   Polycythemia 12/19/2020   Facial droop    Acute respiratory failure with hypoxia (HCC)    Malignant neoplasm of prostate (HCC) 01/26/2019   Elevated prostate specific antigen (PSA) 01/04/2019   Collagenous colitis 07/23/2018   Diverticulosis 06/09/2018   History of adenomatous polyp of colon 06/09/2018   Internal hemorrhoids 06/09/2018   Melena 10/31/2017   OSA (obstructive sleep apnea) 07/13/2015   Syncope 10/22/2013    Palliative Care Assessment & Plan   Patient Profile:    Assessment: 89 year old gentleman, history of dementia, resident at [facility] memory care unit, history of COPD depression, admitted with  left femoral neck fracture, orthopedics consulted and patient to go for left hip hemiarthroplasty for displaced left hip femoral neck fracture on 04-28-2024.  Palliative care consulted for ongoing goals of care discussions.  Recommendations/Plan:  Surgery today Palliative services to continue to follow along, monitor postoperative course.  Briefly discussed with the family about possibility of skilled nursing facility for rehab with the addition of palliative services towards the end of this hospitalization.  Code Status:    Code Status Orders  (From admission, onward)           Start     Ordered   04/27/24 1034  Do not attempt resuscitation (DNR)- Limited -Do Not Intubate (DNI)  (Code Status)  Continuous       Question Answer Comment  If pulseless and not breathing No CPR or chest compressions.   In Pre-Arrest Conditions (Patient Is Breathing and Has A Pulse) Do not intubate. Provide all appropriate non-invasive medical interventions. Avoid ICU transfer unless indicated or required.   Consent: Discussion documented in EHR or advanced directives reviewed      04/27/24 1034           Code Status History     Date Active Date Inactive Code Status Order ID Comments User Context   04/26/2024 1830 04/27/2024 1034 Full Code [id number]  Armenta Landau, MD Inpatient   04/24/2024 1448 04/26/2024 1830 Full Code [id number]  Gaylin Ke, MD ED   12/19/2020 2158 12/23/2020 2057 Full Code [id number]  Howerter, Gattis Kass, DO ED       Prognosis:  Unable to determine  Discharge Planning: To Be Determined  Care plan was discussed with patient's family present at bedside  Thank you for allowing the Palliative Medicine Team to assist in the care of this patient. MOD MDM     Greater than 50%  of this time was spent counseling and coordinating care related to the above assessment and plan.  Lujean Sake, MD  Please contact Palliative Medicine Team phone at [phone] for questions and  concerns.

## 2024-04-28 NOTE — Plan of Care (Signed)

## 2024-04-28 NOTE — Anesthesia Preprocedure Evaluation (Addendum)
 Anesthesia Evaluation  Patient identified by MRN, date of birth, ID bandGeneral Assessment Comment:Pt sedated on pain meds  Reviewed: Allergy & Precautions, NPO status , Patient's Chart, lab work & pertinent test results  History of Anesthesia Complications Negative for: history of anesthetic complications  Airway Mallampati: I  TM Distance: >3 FB Neck ROM: Full    Dental  (+) Dental Advisory Given   Pulmonary sleep apnea (does not use CPAP) , COPD   breath sounds clear to auscultation       Cardiovascular  Rhythm:Regular Rate:Normal  '22 ECHO:  EF 50 to 55%.  1. The LV has low normal function. Grade I diastolic dysfunction (impaired  relaxation).   2. There is a small (1.1 x 0.4 cm) mobile density on the aortic valve passing between the LVOT and aorta. It appears to be on the left coronary cusp. Recommend TEE to better evaluate. The aortic valve is tricuspid. Aortic valve regurgitation is moderate.   3. There is normal pulmonary artery systolic pressure.   4.  Negative bubble study   Neuro/Psych    Depression   Dementia CVA    GI/Hepatic negative GI ROS, Neg liver ROS,,,  Endo/Other  negative endocrine ROS    Renal/GU negative Renal ROS     Musculoskeletal  (+) Arthritis ,    Abdominal   Peds  Hematology Hb 13.4, plt 166k   Anesthesia Other Findings Prostate cancer  Reproductive/Obstetrics                             Anesthesia Physical Anesthesia Plan  ASA: 3  Anesthesia Plan: General   Post-op Pain Management: Ofirmev  IV (intra-op)*   Induction: Intravenous  PONV Risk Score and Plan: 2 and Ondansetron  and Dexamethasone   Airway Management Planned: Oral ETT  Additional Equipment: None  Intra-op Plan:   Post-operative Plan: Extubation in OR  Informed Consent: I have reviewed the patients History and Physical, chart, labs and discussed the procedure including the risks, benefits  and alternatives for the proposed anesthesia with the patient or authorized representative who has indicated his/her understanding and acceptance.   Patient has DNR.  Suspend DNR and Discussed DNR with power of attorney.   Dental advisory given  Plan Discussed with: CRNA and Surgeon  Anesthesia Plan Comments:         Anesthesia Quick Evaluation

## 2024-04-28 NOTE — Progress Notes (Signed)
 PROGRESS NOTE    Wayne Walls  ZOX:[id number] DOB: 1933/05/28 DOA: 04/24/2024 PCP: Shannan Dart., FNP    Chief Complaint  Patient presents with   Fall    Brief Narrative:  Patient 89 year old gentleman history of advanced dementia, COPD, depression who presented to ED after being found down at memory care unit with imaging consistent with a left femoral neck fracture.  Orthopedics consulted and following.  Patient to undergo left femoral neck fracture repair for palliation as well on 04/28/2024.  Patient's mental status noted to decline during the hospitalization concern for delirium versus possible aspiration pneumonia.  Patient placed empirically on IV antibiotics.  Palliative care consulted and following.   Assessment & Plan:   Principal Problem:   Closed left hip fracture, initial encounter [facility]) Active Problems:   Acute metabolic encephalopathy   Dysphagia   Dementia without behavioral disturbance (HCC)   COPD with emphysema (HCC)   Dehydration   Palliative care encounter   Goals of care, counseling/discussion   DNR (do not resuscitate)   Counseling and coordination of care   Delirium   Need for emotional support   Closed fracture of left hip (HCC)  #1 left femoral neck fracture -Secondary to mechanical fall with details unclear as patient noted with severe advanced dementia. - Patient seen in consultation by orthopedics, he does not have family leaning toward surgical repair however per orthopedics note from today, 04/25/2024, orthopedic MD spoke with patient's son in length, in detail and family leaning toward surgery however initially family would rather wait to see how patient does from a pain standpoint given the fact that he has significant dementia with a decline. -Patient with significant left lower extremity pain to minimal movement. -Anesthesia consulted for nerve block. -Family met with palliative care and decision made to go ahead with surgical repair of  left femoral neck fracture to be done today, 04/28/2024 per orthopedics.  - Pain management, DVT prophylaxis per orthopedics.  2.  Acute metabolic encephalopathy/?  Delirium -Patient lethargic, drowsy, opens eyes to verbal stimuli, answers a few questions and drifts back off to sleep. - Family at bedside and said this is an acute change from patient's baseline. - Patient noted to have some coughing with oral intake as noted per RN on 04/26/2024.. - Patient currently NPO. - Urinalysis done unremarkable.   -Chest x-ray done with underinflation, enlarged cardiopericardial silhouette with vascular congestion.  Interstitial changes seen along the lung bases.  Edema versus subtle infiltrate.  Question small effusions.   -Ammonia levels within normal limits. - Due to patient's altered mental status patient placed empirically on IV Unasyn to cover for possible aspiration pneumonia which we will continue to complete a 5 to 7-day course of antibiotic treatment. - IV fluids, supportive care.  2.  Advanced dementia - Continue home regimen Aricept  when able to take oral intake.   - Haldol as needed for agitation.   - Palliative care consulted and following.    3.  COPD - Continue Incruse, Dulera, DuoNebs as needed.   4.  Dysphagia -Per RN patient with some coughing and choking with oral intake this morning. - Patient seen SLP and patient to remain n.p.o. for now.   - SLP following.   5.  Dehydration - IV fluids   DVT prophylaxis: Lovenox  Code Status: DNR Family Communication: Updated son and daughter and son-in-law and daughter-in-law at bedside.   Disposition: TBD  Status is: Inpatient Remains inpatient appropriate because: Severity of illness  Consultants:  Orthopedics: Dr. Lucienne Ryder 04/25/2024 Wound care RN Palliative care: Dr. Azalea Lento 04/27/2026  Procedures:  CT head 04/24/2024 Chest x-ray 04/24/2024 Plain films of the left hip and pelvis 04/24/2024 Plain films of the left knee  04/24/2024.  Antimicrobials:  Anti-infectives (From admission, onward)    Start     Dose/Rate Route Frequency Ordered Stop   04/28/24 1330  ceFAZolin (ANCEF) IVPB 2g/100 mL premix        2 g 200 mL/hr over 30 Minutes Intravenous On call to O.R. 04/28/24 1149 04/29/24 0559   04/27/24 1000  [MAR Hold]  Ampicillin-Sulbactam (UNASYN) 3 g in sodium chloride  0.9 % 100 mL IVPB        (MAR Hold since Tue 04/28/2024 at 1227.Hold Reason: Transfer to a Procedural area)   3 g 200 mL/hr over 30 Minutes Intravenous Every 6 hours 04/27/24 0834           Subjective: Patient laying in bed sleeping, arousable with verbal stimuli but drifts back off to sleep.  Family at bedside.  Awaiting surgical procedure today.   Objective: Vitals:   04/28/24 0231 04/28/24 0518 04/28/24 0849 04/28/24 1230  BP: 110/73 123/73  110/67  Pulse: 81 83 79 76  Resp: 20 18 18 18   Temp: 97.6 F (36.4 C) 97.7 F (36.5 C)    TempSrc: Axillary Axillary    SpO2: 96% 95% 95% 94%  Weight:      Height:        Intake/Output Summary (Last 24 hours) at 04/28/2024 1253 Last data filed at 04/28/2024 0700 Gross per 24 hour  Intake 2142.46 ml  Output 870 ml  Net 1272.46 ml   Filed Weights   04/24/24 1510 04/25/24 1636  Weight: 80.3 kg 80.3 kg    Examination:  General exam: NAD.  Dry mucous membranes. Respiratory system: Lungs clear to auscultation bilaterally anterior lung fields.  No wheezes, no crackles, no rhonchi.  Fair air movement.  Speaking in full sentences.   Cardiovascular system: Regular rate rhythm no murmurs rubs or gallops.  No JVD.  No pitting lower extremity edema.  Gastrointestinal system: Abdomen soft, nontender, nondistended, positive bowel sounds.  No rebound.  No guarding.  Central nervous system: Drowsy. No focal neurological deficits. Extremities: Left lower extremity externally rotated and shortened, and less tender to palpation.  Skin: No rashes, lesions or ulcers Psychiatry: Judgement and  insight appear poor to fair. Mood & affect appropriate.     Data Reviewed: I have personally reviewed following labs and imaging studies  CBC: Recent Labs  Lab 04/24/24 1239 04/25/24 0328 04/26/24 0324 04/26/24 1922 04/27/24 0337 04/28/24 0337  WBC 11.9* 10.2 9.8 10.0 8.7 7.7  NEUTROABS 10.6*  --   --  8.3*  --   --   HGB 16.2 15.2 15.5 14.2 14.3 13.4  HCT 49.3 46.9 47.4 45.1 46.1 43.2  MCV 100.6* 102.4* 102.6* 104.2* 105.3* 105.4*  PLT 165 177 169 169 163 166    Basic Metabolic Panel: Recent Labs  Lab 04/25/24 0328 04/26/24 0324 04/26/24 1922 04/27/24 0337 04/28/24 0337  NA 140 142 140 141 139  K 4.7 4.1 4.2 3.7 3.9  CL 107 109 106 110 112*  CO2 25 22 23 22  20*  GLUCOSE 130* 118* 113* 105* 115*  BUN 20 22 24* 24* 32*  CREATININE 0.99 0.85 0.91 0.90 0.93  CALCIUM  8.6* 8.4* 8.0* 8.3* 7.7*    GFR: Estimated Creatinine Clearance: 62 mL/min (by C-G formula based on SCr of 0.93  mg/dL).  Liver Function Tests: Recent Labs  Lab 04/26/24 1922  AST 53*  ALT 24  ALKPHOS 94  BILITOT 1.8*  PROT 6.1*  ALBUMIN 3.1*    CBG: No results for input(s): "GLUCAP" in the last 168 hours.   Recent Results (from the past 240 hours)  Urine Culture (for pregnant, neutropenic or urologic patients or patients with an indwelling urinary catheter)     Status: None   Collection Time: 04/25/24  2:27 PM   Specimen: Urine, Clean Catch  Result Value Ref Range Status   Specimen Description   Final    URINE, CLEAN CATCH Performed at [facility], [address] [place], [place] [address]    Special Requests   Final    NONE Performed at [facility], [address] [place], [place] [address]    Culture   Final    NO GROWTH Performed at [facility], [address], [place], [place] [address]    Report Status 04/26/2024 FINAL  Final  Urine Culture (for pregnant, neutropenic or urologic patients or patients with an indwelling urinary  catheter)     Status: None   Collection Time: 04/27/24 12:25 AM   Specimen: Urine, Catheterized  Result Value Ref Range Status   Specimen Description   Final    URINE, CATHETERIZED Performed at [facility], [address], [place], [place] [address]    Special Requests   Final    NONE Performed at [facility], [address] [place], [place] [address]    Culture   Final    NO GROWTH Performed at [facility], [address] [place], [place] [address]    Report Status 04/28/2024 FINAL  Final  Surgical PCR screen     Status: Abnormal   Collection Time: 04/27/24  9:42 PM   Specimen: Nasal Mucosa; Nasal Swab  Result Value Ref Range Status   MRSA, PCR NEGATIVE NEGATIVE Final   Staphylococcus aureus POSITIVE (A) NEGATIVE Final    Comment: (NOTE) The Xpert SA Assay (FDA approved for NASAL specimens in patients 63 years of age and older), is one component of a comprehensive surveillance program. It is not intended to diagnose infection nor to guide or monitor treatment. Performed at [facility], [address], [place], [place] [address]          Radiology Studies: DG CHEST PORT 1 VIEW Result Date: 04/26/2024 CLINICAL DATA:  Altered mental status. EXAM: PORTABLE CHEST 1 VIEW COMPARISON:  Chest x-ray 04/24/2024 and older. FINDINGS: Enlarged cardiopericardial silhouette. Underinflation. Vascular congestion with increasing interstitial changes along the lower lung zones, right greater than left. Infiltrate versus edema. Possible effusions. No pneumothorax. Interface density change running vertically laterally along the left hemithorax very well could be sequela of overlapping shadow. This was seen on previous examination as well. Osteopenia degenerative changes. IMPRESSION: Underinflation. Enlarged cardiopericardial silhouette with vascular congestion. Interstitial changes seen along the lung bases. Edema versus subtle  infiltrate. Question small effusions. Recommend follow-up. Electronically Signed   By: Adrianna Horde M.D.   On: 04/26/2024 19:23         Scheduled Meds:  chlorhexidine  60 mL Topical Once   chlorhexidine  15 mL Mouth/Throat Once   Or   mouth rinse  15 mL Mouth Rinse Once   [MAR Hold] Chlorhexidine Gluconate Cloth  6 each Topical Daily   [MAR Hold] donepezil   5 mg Oral Daily   [  MAR Hold] enoxaparin  (LOVENOX ) injection  40 mg Subcutaneous Q24H   [MAR Hold] fluticasone  furoate-vilanterol  1 puff Inhalation Daily   [MAR Hold] liver oil-zinc  oxide   Topical BID   [MAR Hold] mupirocin  ointment  1 Application Nasal BID   [MAR Hold] pantoprazole  (PROTONIX ) IV  40 mg Intravenous QHS   povidone-iodine   2 Application Topical Once   [MAR Hold] umeclidinium bromide   1 puff Inhalation Daily   Continuous Infusions:  sodium chloride  100 mL/hr at 04/28/24 1610   acetaminophen  1,000 mg (04/28/24 0525)   [MAR Hold] ampicillin -sulbactam (UNASYN ) IV 3 g (04/28/24 0926)    ceFAZolin  (ANCEF ) IV     lactated ringers      tranexamic acid         LOS: 4 days    Time spent: 35 minutes    Hilda Lovings, MD [facility]   To contact the attending provider between 7A-7P or the covering provider during after hours 7P-7A, please log into the web site [email or web address] and access using universal [place] password for that web site. If you do not have the password, please call the hospital operator.  04/28/2024, 12:53 PM

## 2024-04-28 NOTE — Op Note (Signed)
 Operative Note  Date of operation: 04/28/2024 Preoperative diagnosis: Left hip displaced femoral neck fracture Postoperative diagnosis: Same  Procedure: Left direct anterior hip bipolar hemiarthroplasty  Implants: Implant Name Type Inv. Item Serial No. Manufacturer Lot No. LRB No. Used Action  HEAD BIPOLAR AML DEPUY 56 - [id number] Hips HEAD BIPOLAR AML DEPUY 56  [employer]PU[employer] [id number] Left 1 Implanted  STEM FEM ACTIS HIGH SZ8 - [id number] Stem STEM FEM ACTIS HIGH SZ8  [employer]PU[employer] ORTH[employer] [id number] Left 1 Implanted  HEAD FEM STD 28X+1.5 STRL - [id number] Hips HEAD FEM STD 28X+1.5 [employer] [id number] Left 1 Implanted   Surgeon: Jeanella Milan. Lucienne Ryder, MD Assistant: Malena Scull, PA-C  Anesthesia: General EBL: 200 cc Antibiotics: IV Ancef Complications: None  Indications: The patient is an 89 year old gentleman with Alzheimer's dementia who stays in the memory care unit.  On Friday he had a mechanical fall injuring his left hip and had inability ambulate.  He exhibits a significant mount of pain with this displaced femoral neck fracture.  Prior to this injury he did walk and ambulated within the memory care unit and even out in the community.  His family has been very involved with his care and given the severity of the pain that he is experiencing anytime they set him up, it is prudent to proceed with a hemiarthroplasty of his left hip for pain control and quality of life purposes.  Palliative care agrees with this as well.  I did discuss in length in detail the risks and benefits of the surgery.  Procedure description: After informed consent was obtained and the appropriate left hip was marked, the patient was brought to the operating room and general anesthesia was obtained while he was on a stretcher.  Traction boots were placed on both his feet and next he was placed supine on the Hana fracture table with a perineal post and placed in both legs in inline skeletal  traction devices no traction applied.  We did assess his left fractured hip radiographically in the pelvis.  The left hip was then prepped and draped with DuraPrep and sterile drapes.  A timeout was called and he was identified as the correct patient and correct left hip.  We then made an incision just inferior and posterior to the ASIS and carried the slightly obliquely down the leg.  Dissection was carried down to the tensor fascia lata muscle and the tensor fascia was then divided longitudinally to proceed with a direct tender approach to the hip.  Circumflex vessels were identified and cauterized.  The hip capsule was identified and opened up in L-type format finding a moderate hemarthrosis and an obvious displaced femoral neck fracture.  Using an oscillating saw we made a freshening femoral neck cut just distal to the fracture and proximal to the lesser trochanter.  This cut was completed an osteotome.  We remove remnants of the fracture femoral neck and then remove the femoral head easily in its entirety using a corkscrew guide.  Took a measurement off of this and chose our 56 bipolar femoral head.  Attention was then turned the femur.  With the left leg externally rotated to 120 degrees, extended and adducted, a Mueller retractors placed medially and a Hohmann retractor was placed behind the greater trochanter.  The lateral joint capsule was released and a box cutting osteotome was used in her femoral canal.  We then began broaching from a size 0 broach in stepwise increments going up to a size 8 broach.  This was using the Actis broaching system from [employer].  We then trialed a size 8 Actis femoral component with standard offset neck combined with a 56/28+1.5 trial bipolar head ball.  The left leg was brought over and up with traction internal rotation reduced into the pelvis.  We then assessed it clinically and radiographically and felt like we needed more offset.  We dislocated the hip and removed the trial  components.  We then placed the real Actis femoral component with high offset size 8 and went with the real 56/28+1.5 metal bipolar hip ball.  Again this was reduced in the pelvis and we are pleased with offset and stability and radiographic assessment mechanically as well.  We then irrigated the soft tissue normal saline solution.  The joint capsule was closed with interrupted #1 Ethibond suture followed by #1 Vicryl to close the tensor fascia.  0 Vicryl was used to close deep tissue and 2-0 Vicryl was used to close the subcutaneous tissue.  The skin was closed with staples.  An Aquacel dressing was applied.  The patient was taken off of the Hana table, awakened, extubated and taken the recovery room.  Malena Scull, PA-C did assist during the entire case and beginning to end and his assistance was crucial and medically necessary for soft tissue management and retraction, helping guide implant placement and a layered closure of the wound.

## 2024-04-28 NOTE — Anesthesia Procedure Notes (Signed)
 Procedure Name: Intubation Date/Time: 04/28/2024 3:49 PM  Performed by: Darlena Ego, CRNAPre-anesthesia Checklist: Patient identified, Emergency Drugs available, Suction available and Patient being monitored Patient Re-evaluated:Patient Re-evaluated prior to induction Oxygen  Delivery Method: Circle System Utilized Preoxygenation: Pre-oxygenation with 100% oxygen  Induction Type: IV induction Ventilation: Mask ventilation without difficulty Laryngoscope Size: Miller and 2 Grade View: Grade I Tube type: Oral Tube size: 7.5 mm Number of attempts: 1 Airway Equipment and Method: Stylet and Oral airway Placement Confirmation: ETT inserted through vocal cords under direct vision, positive ETCO2 and breath sounds checked- equal and bilateral Secured at: 23 cm Tube secured with: Tape Dental Injury: Teeth and Oropharynx as per pre-operative assessment

## 2024-04-28 NOTE — Anesthesia Postprocedure Evaluation (Signed)
 Anesthesia Post Note  Patient: Wayne Walls  Procedure(s) Performed: LEFT HIP HEMIARTHROPLASTY, ANTERIOR APPROACH (Left: Hip)     Patient location during evaluation: PACU Anesthesia Type: General Level of consciousness: awake and alert and patient cooperative Pain management: pain level controlled Vital Signs Assessment: post-procedure vital signs reviewed and stable Respiratory status: spontaneous breathing, nonlabored ventilation, respiratory function stable and patient connected to nasal cannula oxygen  Cardiovascular status: blood pressure returned to baseline and stable Postop Assessment: no apparent nausea or vomiting Anesthetic complications: no Comments: Brief episode of Afib in emergence, treated and converted with Esmolol     No notable events documented.  Last Vitals:  Vitals:   04/28/24 1800 04/28/24 1815  BP: 114/63 114/67  Pulse: 69 70  Resp: 13 19  Temp:    SpO2: 92%     Last Pain:  Vitals:   04/28/24 0703  TempSrc:   PainSc: Asleep                 Jb Dulworth,E. Masen Salvas

## 2024-04-28 NOTE — Progress Notes (Signed)
 Patient ID: Wayne Walls, male   DOB: 12-May-1933, 89 y.o.   MRN: [id number] I have seen the patient in the preoperative holding area and spoke with his son at the bedside.  I explained in detail with the surgery involved today in terms of addressing the patient's left displaced neck fracture.  This is definitely for comfort purposes and quality of care purposes from a pain control standpoint.  The risks and benefits of surgery been discussed in detail and informed consent has been obtained.  The left operative hip has been marked.  I did review the patient's chart from today and have been seeing the patient the last few days.

## 2024-04-28 NOTE — Transfer of Care (Signed)
 Immediate Anesthesia Transfer of Care Note  Patient: Wayne Walls  Procedure(s) Performed: Procedure(s): LEFT HIP HEMIARTHROPLASTY, ANTERIOR APPROACH (Left)  Patient Location: PACU  Anesthesia Type:General  Level of Consciousness:  sedated, patient cooperative and responds to stimulation  Airway & Oxygen  Therapy:Patient Spontanous Breathing and Patient connected to face mask oxgen  Post-op Assessment:  Report given to PACU RN and Post -op Vital signs reviewed and stable  Post vital signs:  Reviewed and stable  Last Vitals:  Vitals:   04/28/24 0849 04/28/24 1230  BP:  110/67  Pulse: 79 76  Resp: 18 18  Temp:    SpO2: 95% 94%    Complications: No apparent anesthesia complications

## 2024-04-29 DIAGNOSIS — S72002A Fracture of unspecified part of neck of left femur, initial encounter for closed fracture: Secondary | ICD-10-CM | POA: Diagnosis not present

## 2024-04-29 LAB — BASIC METABOLIC PANEL WITH GFR
Anion gap: 7 (ref 5–15)
BUN: 34 mg/dL — ABNORMAL HIGH (ref 8–23)
CO2: 25 mmol/L (ref 22–32)
Calcium: 7.8 mg/dL — ABNORMAL LOW (ref 8.9–10.3)
Chloride: 113 mmol/L — ABNORMAL HIGH (ref 98–111)
Creatinine, Ser: 0.89 mg/dL (ref 0.61–1.24)
GFR, Estimated: >60 mL/min (ref >60)
Glucose, Bld: 108 mg/dL — ABNORMAL HIGH (ref 70–99)
Potassium: 4 mmol/L (ref 3.5–5.1)
Sodium: 145 mmol/L (ref 135–145)

## 2024-04-29 LAB — CBC WITH DIFFERENTIAL/PLATELET
Abs Immature Granulocytes: 0.11 10*3/uL — ABNORMAL HIGH (ref 0.00–0.07)
Basophils Absolute: 0 10*3/uL (ref 0.0–0.1)
Basophils Relative: 0 %
Eosinophils Absolute: 0.2 10*3/uL (ref 0.0–0.5)
Eosinophils Relative: 2 %
HCT: 39.9 % (ref 39.0–52.0)
Hemoglobin: 12.2 g/dL — ABNORMAL LOW (ref 13.0–17.0)
Immature Granulocytes: 2 %
Lymphocytes Relative: 10 %
Lymphs Abs: 0.7 10*3/uL (ref 0.7–4.0)
MCH: 32.5 pg (ref 26.0–34.0)
MCHC: 30.6 g/dL (ref 30.0–36.0)
MCV: 106.4 fL — ABNORMAL HIGH (ref 80.0–100.0)
Monocytes Absolute: 0.6 10*3/uL (ref 0.1–1.0)
Monocytes Relative: 8 %
Neutro Abs: 5.2 10*3/uL (ref 1.7–7.7)
Neutrophils Relative %: 78 %
Platelets: 169 10*3/uL (ref 150–400)
RBC: 3.75 MIL/uL — ABNORMAL LOW (ref 4.22–5.81)
RDW: 13.6 % (ref 11.5–15.5)
WBC: 6.7 10*3/uL (ref 4.0–10.5)
nRBC: 0 % (ref 0.0–0.2)

## 2024-04-29 MED ORDER — KETOROLAC TROMETHAMINE 15 MG/ML IJ SOLN
15.0000 mg | Freq: Three times a day (TID) | INTRAMUSCULAR | Status: DC | PRN
  Administered 2024-04-29 – 2024-05-03 (×3): 15 mg via INTRAVENOUS
  Filled 2024-04-29 (×3): qty 1

## 2024-04-29 MED ORDER — EPHEDRINE 5 MG/ML INJ
INTRAVENOUS | Status: AC
  Filled 2024-04-29: qty 5

## 2024-04-29 NOTE — Evaluation (Signed)
 Physical Therapy Evaluation Patient Details Name: Wayne Walls MRN: [id number] DOB: Apr 23, 1933 Today's Date: 04/29/2024  History of Present Illness  89 yo male  admitted 04/24/24 after a fall sustained  left femoral neck fracture. Surgery delayed  as family deciding  on surg vs. conservative. Patient S/P  left hip anterior bipolar hemiarthroplasty 04/28/24. WUJ:[id number], COPD, glaucoma, poss. Stroke  Clinical Impression  Pt admitted with above diagnosis.  Pt currently with functional limitations due to the deficits listed below (see PT Problem List). Pt will benefit from acute skilled PT to increase their independence and safety with mobility to allow discharge.     The patient is alert, son at bedside.  Patient premedicated with tylenol  ~ 1hour prior. Patient does indicate discomfort when RLE was moved as it was in External rotation and flexed. Gradually able to relax and stretch the R knee to  full extension. Patient also indicates left foot and  left hip pain with any movement.  Patient placed in bed chair position, as upright as bed would accommodate. Patient tolerated well, assisted patient to lean forward  in bed  to increase passive hip flex.  Patient tolerated well. Left in semi sitting position in bed.  Patient was ambulatory with a RW at baseline, required assistance with ADL's. Patient will benefit from continued inpatient follow up therapy, <3 hours/day      If plan is discharge home, recommend the following: Two people to help with walking and/or transfers;Two people to help with bathing/dressing/bathroom;Assistance with feeding;Supervision due to cognitive status   Can travel by private vehicle        Equipment Recommendations None recommended by PT  Recommendations for Other Services       Functional Status Assessment Patient has had a recent decline in their functional status and demonstrates the ability to make significant improvements in function in a reasonable and  predictable amount of time.     Precautions / Restrictions Precautions Precautions: Posterior Hip Precaution/Restrictions Comments: order for posterior precautions if hemiarthroplasy, yet was anterior. Restrictions LLE Weight Bearing Per Provider Order: Weight bearing as tolerated Other Position/Activity Restrictions: order states "No restricitons"      Mobility  Bed Mobility Overal bed mobility: Needs Assistance             General bed mobility comments: placed bed i  chair position and tilted forward to facilitate sitting mor uypright . assisted patient to lean forward using bed pad within patient tolerance. patient sat more erect x 15"    Transfers                   General transfer comment: unable to atempt due to pt. response to moving the LLE    Ambulation/Gait                  Stairs            Wheelchair Mobility     Tilt Bed    Modified Rankin (Stroke Patients Only)       Balance Overall balance assessment: Needs assistance, History of Falls Sitting-balance support: Bilateral upper extremity supported Sitting balance-Leahy Scale: Zero Sitting balance - Comments: when leaned forward in chair position                                     Pertinent Vitals/Pain Pain Assessment Breathing: occasional labored breathing, short period of hyperventilation Negative Vocalization: occasional  moan/groan, low speech, negative/disapproving quality Facial Expression: facial grimacing Body Language: tense, distressed pacing, fidgeting Consolability: distracted or reassured by voice/touch PAINAD Score: 6 Pain Location: with Left  foot movement and  left hip and knee, also stretching right knee, leg ER and knee flexed position Pain Intervention(s): Premedicated before session    Home Living Family/patient expects to be discharged to:: Skilled nursing facility                   Additional Comments: from [facility] care/ALF  [facility]    Prior Function Prior Level of Function : Needs assist  Cognitive Assist : Mobility (cognitive);ADLs (cognitive) Mobility (Cognitive): Intermittent cues ADLs (Cognitive): Step by step cues       Mobility Comments: ambulated with RW independently ADLs Comments: assisted by staff     Extremity/Trunk Assessment   Upper Extremity Assessment Upper Extremity Assessment: Generalized weakness    Lower Extremity Assessment Lower Extremity Assessment: RLE deficits/detail;LLE deficits/detail RLE Deficits / Details: positioned in bed in ER and knee flexed, tightness in  hamstrings, gradually able to relax and stretch LLE Deficits / Details: gradually able to passisvely flex hip by [facility]  elevated and chair position of bed LLE: Unable to fully assess due to pain    Cervical / Trunk Assessment Cervical / Trunk Assessment: Kyphotic  Communication   Communication Factors Affecting Communication: Difficulty expressing self;Reduced clarity of speech    Cognition Arousal: Alert Behavior During Therapy: Flat affect   PT - Cognitive impairments: History of cognitive impairments, Awareness, Orientation   Orientation impairments: Place, Time, Situation                     Following commands: Impaired       Cueing Cueing Techniques: Verbal cues, Gestural cues, Tactile cues, Visual cues     General Comments      Exercises General Exercises - Lower Extremity Ankle Circles/Pumps: AAROM, PROM, Both, 10 reps Short Arc Quad: AAROM, PROM, Both, 15 reps Heel Slides: PROM, AAROM, Both, 10 reps   Assessment/Plan    PT Assessment Patient needs continued PT services  PT Problem List Decreased strength;Decreased activity tolerance;Decreased mobility;Decreased cognition;Decreased safety awareness;Decreased range of motion;Decreased balance;Decreased knowledge of use of DME;Decreased knowledge of precautions;Pain       PT Treatment Interventions DME  instruction;Therapeutic activities;Balance training;Cognitive remediation;Functional mobility training;Therapeutic exercise;Patient/family education    PT Goals (Current goals can be found in the Care Plan section)  Acute Rehab PT Goals Patient Stated Goal: to walk again PT Goal Formulation: With family Time For Goal Achievement: 05/13/24 Potential to Achieve Goals: Fair    Frequency Min 2X/week     Co-evaluation               AM-PAC PT "6 Clicks" Mobility  Outcome Measure Help needed turning from your back to your side while in a flat bed without using bedrails?: Total Help needed moving from lying on your back to sitting on the side of a flat bed without using bedrails?: Total Help needed moving to and from a bed to a chair (including a wheelchair)?: Total Help needed standing up from a chair using your arms (e.g., wheelchair or bedside chair)?: Total Help needed to walk in hospital room?: Total Help needed climbing 3-5 steps with a railing? : Total 6 Click Score: 6    End of Session   Activity Tolerance: Patient limited by pain;Patient tolerated treatment well Patient left: in bed;with bed alarm set;with call bell/phone within  reach;with family/visitor present Nurse Communication: Mobility status;Need for lift equipment PT Visit Diagnosis: Other abnormalities of gait and mobility (R26.89);Muscle weakness (generalized) (M62.81);History of falling (Z91.81);Pain Pain - Right/Left: Left Pain - part of body: Hip    Time: 1415-1443 PT Time Calculation (min) (ACUTE ONLY): 28 min   Charges:   PT Evaluation $PT Eval Low Complexity: 1 Low PT Treatments $Therapeutic Activity: 8-22 mins PT General Charges $$ ACUTE PT VISIT: 1 Visit         Abelina Hoes PT Acute Rehabilitation Services Office [phone]   Dareen Ebbing 04/29/2024, 3:55 PM

## 2024-04-29 NOTE — Progress Notes (Addendum)
 Speech Language Pathology Treatment: Dysphagia  Patient Details Name: Wayne Walls MRN: [id number] DOB: 05-02-1933 Today's Date: 04/29/2024 Time: 0830-0906 SLP Time Calculation (min) (ACUTE ONLY): 36 min  Assessment / Plan / Recommendation Clinical Impression  Patient seen for SLP follow-up regarding his swallow function. Patient noting to be alert however disoriented to location specific diagnosis oriented to self via name but not his birthday for year. Oral cavity is xerostomic with coating observed on the lingual surface. SLP had pt brush his teeth and set up oral suction for oral care - resulting in some clearance - however lingual coating remained. In addition patient appears with right facial asymmetry and right lingual deviation upon protrusion. Of note prior right facial asymmetry noted in 2022 for which patient had brain imaging that did not show any acute findings. Pt is continuing to demonstrate clinical indication of oral pharyngeal or buccal esophageal dysphagia. After oral care patient presented with single ice chips requiring extra boluses *likely due to poor sensation and awareness* to elicit a swallow after not successful with the first 2 ice chips. Consumption of ice chips, water, Ensure resulted in multiple subswallows with both solid and liquids and oral retention most notably with graham cracker with poor awareness. Patient does respond to cues to swallow however and this helps to facilitate clearance. Neck appears to be mildly hyperextended and patient benefits from total cues for positioning for airway protection with p.o. Reflexive cough noted after approximately 50% of p.o. trials most obviously after liquids. Patient did not cough and expectorate on command to clear her airway despite total cues. His voice is clear but suspect some presbyphonia with decreased vocal and cough strength. Concern for patient obtaining adequate nutrition and hydration as well as aspiration present  given results of initial evaluation on 04/26/2024, suspect this may be ongoing. No family present to establish baseline function -per chart review patient did not cough with intake prior to fall. Concerns present for hypoglossal and facial given cranial nerve exam findings. Options include to conduct MBS to aid in elucidating source of dysphagia (especially given CN deficits) and determine if any compensation strategies may be protective to maximize comfort with intake. Regardless, recommend follow up with palliative and hospitalist as comfort intake appears to be a reasonable plan for this pt who has dementia at baseline. Spoke to RN regarding recommendations. Will follow up for family education and assistance in executing care plan.   Pt is clinically progressing with maintaining alertness, managing secretions and eliciting swallow - compared to initial evaluation 04/26/2024.     HPI HPI: Patient is an 89 y.o. male with PMH: advanced dementia, COPD, depression. He presented to the hospital on 04/24/24 from his memory care facility after being found down. He suffered a left femoral neck fracture. He has been lethargic. RN noted him to have some coughing with PO's on 04/26/24 and he was made NPO pending SLP swallow evaluation.      SLP Plan  Continue with current plan of care;Other (Comment) (MBS and/or comfort po intake)      Recommendations for follow up therapy are one component of a multi-disciplinary discharge planning process, led by the attending physician.  Recommendations may be updated based on patient status, additional functional criteria and insurance authorization.    Recommendations  Diet recommendations: Other(comment) (consider comfort diet if medical team agreeable, pending determination, recommend sips and chips) Medication Administration: Other (Comment)  Oral care QID     Dysphagia, oropharyngeal phase (R13.12)     Continue with current plan of  care;Other (Comment) (MBS and/or comfort po intake)   Wayne Sorrow, MS [facility] SLP Acute Rehab Services Office [phone]   Wayne Walls Comment  04/29/2024, 9:29 AM   Oral cavity post-oral with dental brushing and oral suctioning

## 2024-04-29 NOTE — Plan of Care (Signed)
  Problem: Clinical Measurements: Goal: Respiratory complications will improve Outcome: Progressing   Problem: Pain Managment: Goal: General experience of comfort will improve and/or be controlled Outcome: Progressing   Problem: Pain Management: Goal: Pain level will decrease Outcome: Progressing

## 2024-04-29 NOTE — Progress Notes (Signed)
 Patient ID: Wayne Walls, male   DOB: Mar 09, 1933, 89 y.o.   MRN: [id number] The patient tolerated surgery well and his left hip late yesterday afternoon.  His vital signs are stable this morning and his H/H is stable postoperatively.  His left hip dressing is clean and intact.  Hopefully this will help from a pain control standpoint to allow for ADLs more easily as he recovers from surgery.

## 2024-04-29 NOTE — Hospital Course (Signed)
 89 year old gentleman history of advanced dementia, COPD, depression who presented to ED after being found down at memory care unit with imaging consistent with a left femoral neck fracture.  Orthopedics consulted and following.  Patient to undergo left femoral neck fracture repair for palliation as well on 04/28/2024.  Patient's mental status noted to decline during the hospitalization concern for delirium versus possible aspiration pneumonia.  Patient placed empirically on IV antibiotics.  Palliative care consulted and following.

## 2024-04-29 NOTE — Progress Notes (Signed)
  Progress Note   Patient: Wayne Walls JYN:[id number] DOB: Jun 29, 1933 DOA: 04/24/2024     5 DOS: the patient was seen and examined on 04/29/2024   Brief hospital course: 89 year old gentleman history of advanced dementia, COPD, depression who presented to ED after being found down at memory care unit with imaging consistent with a left femoral neck fracture.  Orthopedics consulted and following.  Patient to undergo left femoral neck fracture repair for palliation as well on 04/28/2024.  Patient's mental status noted to decline during the hospitalization concern for delirium versus possible aspiration pneumonia.  Patient placed empirically on IV antibiotics.  Palliative care consulted and following.   Assessment and Plan: #1 left femoral neck fracture -Secondary to mechanical fall with details unclear as patient noted with severe advanced dementia. - Patient seen in consultation by orthopedics, now s/p surgery 5/27 - Pain management, DVT prophylaxis per orthopedics.   2.  Acute metabolic encephalopathy/?  Delirium -Patient lethargic initially had been, drowsy, opens eyes to verbal stimuli, answers a few questions and drifts back off to sleep. - Patient noted to have some coughing with oral intake as noted per RN on 04/26/2024.. - Due to patient's altered mental status patient placed empirically on IV Unasyn to cover for possible aspiration pneumonia which we will continue to complete a 5 to 7-day course of antibiotic treatment. - IV fluids, supportive care. -More interactive and conversant today per family   2.  Advanced dementia - Continue home regimen Aricept  when able to take oral intake.   - Haldol as needed for agitation.   - Palliative care consulted and following.     3.  COPD - Continue Incruse, Dulera, DuoNebs as needed.    4.  Dysphagia - Patient seen SLP, currently on full liquid - recs noted. Recs to f/u with Palliative as comfort intake would be reasonable   5.   Dehydration - IV fluids      Subjective: Pt not very conversant but does interact with those in room.   Physical Exam: Vitals:   04/29/24 0142 04/29/24 0623 04/29/24 1000 04/29/24 1517  BP: 115/66 120/68 127/72 119/70  Pulse: 75 73 79 72  Resp: 18 18 15 15   Temp: (!) 97.5 F (36.4 C) 97.6 F (36.4 C) 97.6 F (36.4 C) 97.8 F (36.6 C)  TempSrc: Axillary Axillary Axillary   SpO2: 95% 97% 97% 96%  Weight:      Height:       General exam: Awake, laying in bed, in nad Respiratory system: Normal respiratory effort, no wheezing Cardiovascular system: regular rate, s1, s2 Gastrointestinal system: Soft, nondistended, positive BS Central nervous system: CN2-12 grossly intact, strength intact Extremities: Perfused, no clubbing Skin: Normal skin turgor, no notable skin lesions seen Psychiatry: Difficult to assess given mentation  Data Reviewed:  Labs reviewed: Na 145, K 4.0, Cr 0.89, WBC 6.7, Hgb 12.2, Plts 169  Family Communication: Pt in room, family at bedside  Disposition: Status is: Inpatient Remains inpatient appropriate because: severity of illness  Planned Discharge Destination: Home    Author: Cherylle Corwin, MD 04/29/2024 4:40 PM  For on call review [email or web address].

## 2024-04-30 ENCOUNTER — Encounter (HOSPITAL_COMMUNITY): Payer: Self-pay | Admitting: Orthopaedic Surgery

## 2024-04-30 DIAGNOSIS — S72002A Fracture of unspecified part of neck of left femur, initial encounter for closed fracture: Secondary | ICD-10-CM | POA: Diagnosis not present

## 2024-04-30 LAB — COMPREHENSIVE METABOLIC PANEL WITH GFR
ALT: 33 U/L (ref 0–44)
AST: 36 U/L (ref 15–41)
Albumin: 2.4 g/dL — ABNORMAL LOW (ref 3.5–5.0)
Alkaline Phosphatase: 82 U/L (ref 38–126)
Anion gap: 5 (ref 5–15)
BUN: 26 mg/dL — ABNORMAL HIGH (ref 8–23)
CO2: 25 mmol/L (ref 22–32)
Calcium: 8 mg/dL — ABNORMAL LOW (ref 8.9–10.3)
Chloride: 115 mmol/L — ABNORMAL HIGH (ref 98–111)
Creatinine, Ser: 0.99 mg/dL (ref 0.61–1.24)
GFR, Estimated: >60 mL/min (ref >60)
Glucose, Bld: 122 mg/dL — ABNORMAL HIGH (ref 70–99)
Potassium: 3.5 mmol/L (ref 3.5–5.1)
Sodium: 145 mmol/L (ref 135–145)
Total Bilirubin: 1 mg/dL (ref 0.0–1.2)
Total Protein: 4.9 g/dL — ABNORMAL LOW (ref 6.5–8.1)

## 2024-04-30 LAB — CBC
HCT: 38.5 % — ABNORMAL LOW (ref 39.0–52.0)
Hemoglobin: 11.7 g/dL — ABNORMAL LOW (ref 13.0–17.0)
MCH: 32.7 pg (ref 26.0–34.0)
MCHC: 30.4 g/dL (ref 30.0–36.0)
MCV: 107.5 fL — ABNORMAL HIGH (ref 80.0–100.0)
Platelets: 162 10*3/uL (ref 150–400)
RBC: 3.58 MIL/uL — ABNORMAL LOW (ref 4.22–5.81)
RDW: 13.5 % (ref 11.5–15.5)
WBC: 5.9 10*3/uL (ref 4.0–10.5)
nRBC: 0 % (ref 0.0–0.2)

## 2024-04-30 MED ORDER — LACTATED RINGERS IV SOLN: INTRAVENOUS | Status: AC

## 2024-04-30 MED ORDER — FOOD THICKENER (SIMPLYTHICK)
1.0000 | ORAL | Status: DC | PRN
  Filled 2024-04-30: qty 1

## 2024-04-30 MED ORDER — ASPIRIN 300 MG RE SUPP
150.0000 mg | Freq: Every day | RECTAL | Status: DC
  Administered 2024-05-03: 150 mg via RECTAL
  Filled 2024-04-30 (×5): qty 1

## 2024-04-30 MED ORDER — PHENYLEPHRINE HCL-NACL 20-0.9 MG/250ML-% IV SOLN
INTRAVENOUS | Status: AC
  Filled 2024-04-30: qty 500

## 2024-04-30 NOTE — TOC PASRR Note (Signed)
 30 Day PASRR Note   Patient Details  Name: Wayne Walls Date of Birth: Jun 26, 1933   Transition of Care [facility]) CM/SW Contact:    Bari Leys, RN Phone Number: 04/30/2024, 2:38 PM  To Whom It May Concern:  Please be advised that this patient will require a short-term nursing home stay - anticipated 30 days or less for rehabilitation and strengthening.   The plan is for return home.

## 2024-04-30 NOTE — Progress Notes (Addendum)
 SLP met with family re pt's care plan including pt's DIL, Wayne Walls, who is a retired Advice worker and son, Wayne Walls.  Answered questions addressing diet - and Wayne Walls is requesting puree/nectar diet due to her concern for pt overtly coughing with thin water.  Advised that SLP cannot assure pt is not aspirating even thicker consistencies and if he is aspirating purees, this could cause more pulmonary complications/discomfort. She states pt was not coughing with applesauce and slightly thicker liquids that she administered to him today.  She states he did not manage foods requiring mastication well.   Wayne Walls advised pt was overtly coughing with thin liquids/intake earlier and that he appeared uncomfortable. SLP questioned if pt could tolerate tsps of thin liquids - but DIL stated this was still too much for him. Of note, premorbidly Wayne Walls reports pt has no coughing with intake but would require extra time to masticate and clear solids.  Discussed comfort feeding with known risks, to which they are agreeable.    Son reports he wants pt to return to prior level of functioning - stating he was eating/drinking well and was recently on a boat.  SLP is concerned for adequacy of nutrition and hydration given his AMS and ongoing s/s of aspiration.    Did not see pt with po intake as he was lethargic with open mouth breathing and not appropriate for po at this time.  Discussed option of conducting MBS *(only when pt is fully alert - to determine potential effective compensation strategies, least restrictive diet as well as exercises to improve swallow function.  Wayne Walls wishes to proceed with MBS when pt able to participate with decreased discomfort.    Reviewed that pt has right facial asymmetry as well as right lingual deviation noted during SLP session yesterday.  Right facial asymmetry was seen on prior exam (2022) prompting brain imaging that was negative.  Uncertain if pt had lingual deviation at that time as it was not  listed as a clinical indication.  Son and DIL reports pt had CT of brain during this hospital stay but state that he has not had an MRI- - therefore they question if he may have had a stroke that could account for his dysphagia.  Defer to MD- Full note to be filed and SLP will follow up.  Precautions posted and requested RN obtain regulator for oral suction.   Maudie Sorrow, MS [facility] SLP Acute [employer] [phone]

## 2024-04-30 NOTE — Progress Notes (Signed)
 Chaplain visited pt. Pt warmly received chaplain, but expressed no particular needs at this time. Chaplain remained as a compassionate presence for a time, and remains available.

## 2024-04-30 NOTE — [place] FL2 (Signed)
 [place]  MEDICAID FL2 LEVEL OF CARE FORM     IDENTIFICATION  Patient Name: Wayne Walls Birthdate: [birth year] Sex: male Admission Date (Current Location): 04/24/2024  [facility] and [place] Number:  Producer, television/film/video and Address:  [facility],  [address], [place] [address]      Provider Number: [id number]  Attending Physician Name and Address:  Oral Billings, MD  Relative Name and Phone Number:  Zeshan Sena phone: [phone]    Current Level of Care: Hospital Recommended Level of Care: Skilled Nursing Facility Prior Approval Number:    Date Approved/Denied:   PASRR Number: pending  Discharge Plan: SNF    Current Diagnoses: Patient Active Problem List   Diagnosis Date Noted   Palliative care encounter 04/27/2024   Goals of care, counseling/discussion 04/27/2024   DNR (do not resuscitate) 04/27/2024   Counseling and coordination of care 04/27/2024   Delirium 04/27/2024   Need for emotional support 04/27/2024   Closed fracture of left hip (HCC) 04/27/2024   Acute metabolic encephalopathy 04/26/2024   Dehydration 04/26/2024   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   Closed left hip fracture, initial encounter (HCC) 04/24/2024   Dementia without behavioral disturbance (HCC) 08/24/2021   Gait abnormality 08/24/2021   Stroke (HCC) 12/19/2020   Frequent falls 12/19/2020   Dysphagia 12/19/2020   Polycythemia 12/19/2020   Facial droop    Acute respiratory failure with hypoxia (HCC)    Malignant neoplasm of prostate (HCC) 01/26/2019   Elevated prostate specific antigen (PSA) 01/04/2019   Collagenous colitis 07/23/2018   Diverticulosis 06/09/2018   History of adenomatous polyp of colon 06/09/2018   Internal hemorrhoids 06/09/2018   Melena 10/31/2017   OSA (obstructive sleep apnea) 07/13/2015   Syncope 10/22/2013    Orientation RESPIRATION BLADDER Height & Weight     Self  O2 Incontinent Weight: 80.3 kg Height:  6\' 1"  (185.4 cm)   BEHAVIORAL SYMPTOMS/MOOD NEUROLOGICAL BOWEL NUTRITION STATUS      Incontinent Diet (Dysphagia, thick nectar)  AMBULATORY STATUS COMMUNICATION OF NEEDS Skin   Extensive Assist Verbally Other (Comment) (Stage 1 Pressure Injury buttocks foam dsg prn; left hip surgical incision)                       Personal Care Assistance Level of Assistance  Bathing, Feeding, Dressing Bathing Assistance: Maximum assistance Feeding assistance: Maximum assistance Dressing Assistance: Maximum assistance     Functional Limitations Info  Sight, Hearing, Speech Sight Info: Impaired Hearing Info: Impaired Speech Info: Adequate    SPECIAL CARE FACTORS FREQUENCY  PT (By licensed PT), OT (By licensed OT)     PT Frequency: 5x/wk OT Frequency: 5x/wk            Contractures Contractures Info: Not present    Additional Factors Info  Code Status, Allergies, Psychotropic Code Status Info: DNR Allergies Info: NKA Psychotropic Info: N/A         Current Medications (04/30/2024):  This is the current hospital active medication list Current Facility-Administered Medications  Medication Dose Route Frequency Provider Last Rate Last Admin   acetaminophen  (TYLENOL ) tablet 650 mg  650 mg Oral Q6H PRN Arnie Lao, MD   650 mg at 04/30/24 0056   Or   acetaminophen  (TYLENOL ) suppository 650 mg  650 mg Rectal Q6H PRN Arnie Lao, MD       Ampicillin -Sulbactam (UNASYN ) 3 g in sodium chloride  0.9 % 100 mL IVPB  3 g Intravenous Q6H  Arnie Lao, MD 200 mL/hr at 04/30/24 1045 3 g at 04/30/24 1045   aspirin  suppository 150 mg  150 mg Rectal Daily Oral Billings, MD       Chlorhexidine Gluconate Cloth 2 % PADS 6 each  6 each Topical Daily Arnie Lao, MD   6 each at 04/30/24 1237   diltiazem (CARDIZEM) 125 mg in dextrose 5% 125 mL (1 mg/mL) infusion  5-15 mg/hr Intravenous Titrated Jonne Netters, MD       docusate sodium (COLACE) capsule 100 mg  100 mg Oral BID  Arnie Lao, MD       donepezil  (ARICEPT ) tablet 5 mg  5 mg Oral Daily Arnie Lao, MD   5 mg at 04/30/24 1038   enoxaparin  (LOVENOX ) injection 40 mg  40 mg Subcutaneous Q24H Arnie Lao, MD   40 mg at 04/29/24 1604   fluticasone furoate-vilanterol (BREO ELLIPTA) 200-25 MCG/ACT 1 puff  1 puff Inhalation Daily Arnie Lao, MD   1 puff at 04/30/24 0732   food thickener (SIMPLYTHICK (NECTAR/LEVEL 2/MILDLY THICK)) 1 packet  1 packet Oral PRN Oral Billings, MD       ipratropium-albuterol  (DUONEB) 0.5-2.5 (3) MG/3ML nebulizer solution 3 mL  3 mL Nebulization Q2H PRN Arnie Lao, MD       ketorolac (TORADOL) 15 MG/ML injection 15 mg  15 mg Intravenous Q8H PRN Oral Billings, MD   15 mg at 04/29/24 1604   lactated ringers infusion   Intravenous Continuous Oral Billings, MD       liver oil-zinc oxide (DESITIN) 40 % ointment   Topical BID Arnie Lao, MD   Given at 04/30/24 1237   menthol-cetylpyridinium (CEPACOL) lozenge 3 mg  1 lozenge Oral PRN Arnie Lao, MD       Or   phenol (CHLORASEPTIC) mouth spray 1 spray  1 spray Mouth/Throat PRN Arnie Lao, MD       metoCLOPramide (REGLAN) tablet 5-10 mg  5-10 mg Oral Q8H PRN Arnie Lao, MD       Or   metoCLOPramide (REGLAN) injection 5-10 mg  5-10 mg Intravenous Q8H PRN Arnie Lao, MD       morphine (PF) 2 MG/ML injection 0.5-1 mg  0.5-1 mg Intravenous Q2H PRN Arnie Lao, MD   0.5 mg at 04/30/24 0835   mupirocin ointment (BACTROBAN) 2 % 1 Application  1 Application Nasal BID Arnie Lao, MD   1 Application at 04/30/24 1000   nystatin cream (MYCOSTATIN) 1 Application  1 Application Topical BID PRN Arnie Lao, MD       ondansetron (ZOFRAN) tablet 4 mg  4 mg Oral Q6H PRN Arnie Lao, MD       Or   ondansetron (ZOFRAN) injection 4 mg  4 mg Intravenous Q6H PRN Arnie Lao, MD        Oral care mouth rinse  15 mL Mouth Rinse PRN Arnie Lao, MD       oxyCODONE (Oxy IR/ROXICODONE) immediate release tablet 2.5 mg  2.5 mg Oral Q4H PRN Arnie Lao, MD   2.5 mg at 04/24/24 2347   pantoprazole  (PROTONIX ) injection 40 mg  40 mg Intravenous QHS Arnie Lao, MD   40 mg at 04/29/24 2216   phenylephrine (NEOSYNEPHRINE) 20-0.9 MG/250ML-% infusion            umeclidinium bromide (INCRUSE ELLIPTA) 62.5 MCG/ACT 1 puff  1 puff Inhalation  Daily Arnie Lao, MD   1 puff at 04/30/24 1610     Discharge Medications: Please see discharge summary for a list of discharge medications.  Relevant Imaging Results:  Relevant Lab Results:   Additional Information SSN: [id number]  Bari Leys, RN

## 2024-04-30 NOTE — Progress Notes (Signed)
 Physical Therapy Treatment Patient Details Name: ATANACIO MELNYK MRN: [id number] DOB: 13-Sep-1933 Today's Date: 04/30/2024   History of Present Illness 89 yo male  admitted 04/24/24 after a fall sustained  left femoral neck fracture. Surgery delayed  as family deciding  on surg vs. conservative.',Patient S/P  left hip anterior bipolar hemiarthroplasy. [id number], COPD, glaucoma, poss. Stroke    PT Comments  Pt following min cues and requiring max assist of two for all tasks.  Pt assisted to EOB sitting but with variable posterior and Right drift requiring significant assist to avoid balance loss. Standing deferred for pt safety.  Will follow and progress as pt mentation allows.     If plan is discharge home, recommend the following: Two people to help with walking and/or transfers;Two people to help with bathing/dressing/bathroom;Assistance with feeding;Supervision due to cognitive status   Can travel by private vehicle        Equipment Recommendations  None recommended by PT    Recommendations for Other Services       Precautions / Restrictions Precautions Precautions: Posterior Hip Precaution/Restrictions Comments: order for posterior precautions if hemiarthroplasy, yet was anterior. Restrictions Weight Bearing Restrictions Per Provider Order: Yes LLE Weight Bearing Per Provider Order: Weight bearing as tolerated Other Position/Activity Restrictions: order states "No restricitons"     Mobility  Bed Mobility Overal bed mobility: Needs Assistance Bed Mobility: Supine to Sit, Sit to Supine     Supine to sit: +2 for physical assistance, +2 for safety/equipment, Max assist, Total assist Sit to supine: +2 for physical assistance, +2 for safety/equipment, Max assist, Total assist   General bed mobility comments: Pt assisted to EOB rotating with bedpad and assist to manage LEs and control trunk    Transfers                   General transfer comment: unsafe to attempt 2*  pt lethargy and inability to follow cues    Ambulation/Gait                   Stairs             Wheelchair Mobility     Tilt Bed    Modified Rankin (Stroke Patients Only)       Balance Overall balance assessment: Needs assistance, History of Falls Sitting-balance support: Bilateral upper extremity supported, Feet supported Sitting balance-Leahy Scale: Zero Sitting balance - Comments: Right posterior drift                                    Communication Communication Factors Affecting Communication: Difficulty expressing self;Reduced clarity of speech  Cognition Arousal: Lethargic, Suspect due to medications Behavior During Therapy: Flat affect   PT - Cognitive impairments: History of cognitive impairments, Awareness, Orientation   Orientation impairments: Place, Time, Situation                     Following commands: Impaired      Cueing Cueing Techniques: Verbal cues, Gestural cues, Tactile cues, Visual cues  Exercises      General Comments        Pertinent Vitals/Pain Pain Assessment Pain Assessment: Faces Faces Pain Scale: Hurts little more Pain Descriptors / Indicators: Grimacing Pain Intervention(s): Limited activity within patient's tolerance, Monitored during session, Premedicated before session    Home Living  Prior Function            PT Goals (current goals can now be found in the care plan section) Acute Rehab PT Goals Patient Stated Goal: to walk again PT Goal Formulation: With family Time For Goal Achievement: 05/13/24 Potential to Achieve Goals: Fair Progress towards PT goals: Progressing toward goals    Frequency    Min 2X/week      PT Plan      Co-evaluation              AM-PAC PT "6 Clicks" Mobility   Outcome Measure  Help needed turning from your back to your side while in a flat bed without using bedrails?: Total Help needed moving from  lying on your back to sitting on the side of a flat bed without using bedrails?: Total Help needed moving to and from a bed to a chair (including a wheelchair)?: Total Help needed standing up from a chair using your arms (e.g., wheelchair or bedside chair)?: Total Help needed to walk in hospital room?: Total Help needed climbing 3-5 steps with a railing? : Total 6 Click Score: 6    End of Session   Activity Tolerance: Patient limited by lethargy;Patient limited by fatigue;Patient limited by pain Patient left: in bed;with bed alarm set;with call bell/phone within reach Nurse Communication: Mobility status;Need for lift equipment PT Visit Diagnosis: Other abnormalities of gait and mobility (R26.89);Muscle weakness (generalized) (M62.81);History of falling (Z91.81);Pain Pain - Right/Left: Left Pain - part of body: Hip     Time: 8119-1478 PT Time Calculation (min) (ACUTE ONLY): 19 min  Charges:    $Therapeutic Activity: 8-22 mins PT General Charges $$ ACUTE PT VISIT: 1 Visit                     Thedora Finlay PT Acute Rehabilitation Services Pager [phone] Office [phone]    Dannel Rafter 04/30/2024, 3:23 PM

## 2024-04-30 NOTE — TOC Progression Note (Addendum)
 Transition of Care [facility]) - Progression Note    Patient Details  Name: Wayne Walls MRN: [id number] Date of Birth: January 24, 1933  [facility]) CM/SW Contact  Bari Leys, RN Phone Number: 04/30/2024, 2:02 PM  Clinical Narrative:   NCM met with pt's son, daughter and other visitor in hallway on unit for dc planning, PT recommendation for short term rehab/SNF, family agreeable, family understands that short term rehab is not available at [facility], family voiced understanding, no SNF preference. FL2 updated, Level 2 PASRR pending, faxed out for bed offers.    -2:10pm NCM called to [facility] [place] , sw Terry Ficks , Memory Care Coordinator , [phone],  confirmed patient is resident of Memory Care Unit, facility does not offer any rehab, reports patient's apartment will be available upon return from  SNF. Cammie states patient ambulated independently with use of RW prior to hospital admission. Cammie  reports she spoke with pt's son yesterday regarding status of return to Memory Care Unit.   TOC will continue to follow.       Barriers to Discharge: Continued Medical Work up  Expected Discharge Plan and Services                                               Social Determinants of Health (SDOH) Interventions SDOH Screenings   Food Insecurity: Patient Unable To Answer (04/24/2024)  Housing: Patient Unable To Answer (04/24/2024)  Transportation Needs: Patient Unable To Answer (04/24/2024)  Utilities: Patient Unable To Answer (04/24/2024)  Social Connections: Patient Unable To Answer (04/24/2024)  Tobacco Use: Low Risk  (04/28/2024)    Readmission Risk Interventions    04/27/2024    3:14 PM  Readmission Risk Prevention Plan  Transportation Screening Complete  PCP or Specialist Appt within 3-5 Days Complete  HRI or Home Care Consult Complete  Social Work Consult for Recovery Care Planning/Counseling Complete  Palliative Care Screening Complete   Medication Review [employer]) Complete

## 2024-04-30 NOTE — Progress Notes (Signed)
 Daily Progress Note   Patient Name: Wayne Walls       Date: 04/30/2024 DOB: [birth year]  Age: 89 y.o. MRN#: [id number] Attending Physician: Oral Billings, MD Primary Care Physician: Shannan Dart., FNP Admit Date: 04/24/2024  Reason for Consultation/Follow-up: Establishing goals of care  Subjective: Patient was seen earlier in the day when he was awake and reasonably alert, was able to follow some commands and was able to answer a few questions appropriately. Arrived in the afternoon and family meeting took place outside the patient's room alongside [facility] colleague Ms. Edith    Length of Stay: 6  Current Medications: Scheduled Meds:   aspirin   150 mg Rectal Daily   Chlorhexidine Gluconate Cloth  6 each Topical Daily   docusate sodium  100 mg Oral BID   donepezil   5 mg Oral Daily   enoxaparin  (LOVENOX ) injection  40 mg Subcutaneous Q24H   fluticasone furoate-vilanterol  1 puff Inhalation Daily   liver oil-zinc oxide   Topical BID   mupirocin ointment  1 Application Nasal BID   pantoprazole  (PROTONIX ) IV  40 mg Intravenous QHS   phenylephrine       umeclidinium bromide  1 puff Inhalation Daily    Continuous Infusions:  ampicillin-sulbactam (UNASYN) IV Stopped (04/30/24 1115)   diltiazem (CARDIZEM) infusion     lactated ringers 75 mL/hr at 04/30/24 1536    PRN Meds: acetaminophen **OR** acetaminophen, food thickener, ipratropium-albuterol , ketorolac, menthol-cetylpyridinium **OR** phenol, metoCLOPramide **OR** metoCLOPramide (REGLAN) injection, morphine injection, nystatin cream, ondansetron **OR** ondansetron (ZOFRAN) IV, mouth rinse, oxyCODONE, phenylephrine  Physical Exam         Resting in bed, no distress Regular work of breathing Was completely reasonably alert  earlier during the day Able to verbalize some  Vital Signs: BP 136/71 (BP Location: Left Arm)   Pulse 79   Temp 97.7 F (36.5 C) (Oral)   Resp 18   Ht 6\' 1"  (1.854 m)   Wt 80.3 kg   SpO2 96%   BMI 23.36 kg/m  SpO2: SpO2: 96 % O2 Device: O2 Device: Nasal Cannula O2 Flow Rate: O2 Flow Rate (L/min): 3 L/min  Intake/output summary:  Intake/Output Summary (Last 24 hours) at 04/30/2024 1611 Last data filed at 04/30/2024 1536 Gross per 24 hour  Intake 668.48 ml  Output 1000  ml  Net -331.52 ml   LBM: Last BM Date : 04/29/24 Baseline Weight: Weight: 80.3 kg Most recent weight: Weight: 80.3 kg       Palliative Assessment/Data:      Patient Active Problem List   Diagnosis Date Noted   Palliative care encounter 04/27/2024   Goals of care, counseling/discussion 04/27/2024   DNR (do not resuscitate) 04/27/2024   Counseling and coordination of care 04/27/2024   Delirium 04/27/2024   Need for emotional support 04/27/2024   Closed fracture of left hip (HCC) 04/27/2024   Acute metabolic encephalopathy 04/26/2024   Dehydration 04/26/2024   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   Closed left hip fracture, initial encounter (HCC) 04/24/2024   Dementia without behavioral disturbance (HCC) 08/24/2021   Gait abnormality 08/24/2021   Stroke (HCC) 12/19/2020   Frequent falls 12/19/2020   Dysphagia 12/19/2020   Polycythemia 12/19/2020   Facial droop    Acute respiratory failure with hypoxia (HCC)    Malignant neoplasm of prostate (HCC) 01/26/2019   Elevated prostate specific antigen (PSA) 01/04/2019   Collagenous colitis 07/23/2018   Diverticulosis 06/09/2018   History of adenomatous polyp of colon 06/09/2018   Internal hemorrhoids 06/09/2018   Melena 10/31/2017   OSA (obstructive sleep apnea) 07/13/2015   Syncope 10/22/2013    Palliative Care Assessment & Plan   Patient Profile:    Assessment: 89 year old gentleman, history of dementia, resident at [facility] memory  care unit, history of COPD depression, admitted with left femoral neck fracture, orthopedics consulted and patient to go for left hip hemiarthroplasty for displaced left hip femoral neck fracture on 04-28-2024.  Palliative care consulted for ongoing goals of care discussions.  Recommendations/Plan: Palliative care continues to follow for goals of care discussions and for monitoring hospital status.  Physical therapy note reviewed, discussed with TOC in detail, family meeting also took place, have also corresponded with TRH MD.  Also discussed with SLP. At present, plan is to monitor hospital course and overall disease trajectory of illness for the next 48 to 72 hours.  Agree with placement of Foley catheter for comfort measures, monitor oral intake and PT participation.  At present, tentative plan remains for skilled nursing facility rehabilitation attempt with the addition of palliative services-however, will monitor hospital course and continue goals of care discussions.  Today, in the family meeting, also briefly discussed about hospice philosophy of care, various discharge options such as SNF rehab with palliative versus long-term care with hospice versus residential hospice all brought up and discussed.  Answered family's questions to the best of my ability.  Palliative services to continue to follow along.  Code Status:    Code Status Orders  (From admission, onward)           Start     Ordered   04/27/24 1034  Do not attempt resuscitation (DNR)- Limited -Do Not Intubate (DNI)  (Code Status)  Continuous       Question Answer Comment  If pulseless and not breathing No CPR or chest compressions.   In Pre-Arrest Conditions (Patient Is Breathing and Has A Pulse) Do not intubate. Provide all appropriate non-invasive medical interventions. Avoid ICU transfer unless indicated or required.   Consent: Discussion documented in EHR or advanced directives reviewed      04/27/24 1034            Code Status History     Date Active Date Inactive Code Status Order ID Comments User Context   04/26/2024 1830 04/27/2024 1034  Full Code [id number]  Armenta Landau, MD Inpatient   04/24/2024 1448 04/26/2024 1830 Full Code [id number]  Gaylin Ke, MD ED   12/19/2020 2158 12/23/2020 2057 Full Code [id number]  Howerter, Gattis Kass, DO ED       Prognosis:  Unable to determine  Discharge Planning: To Be Determined  Care plan was discussed with patient's family present at bedside Also discussed with [facility], TRH, nursing colleague and SLP colleague. Thank you for allowing the Palliative Medicine Team to assist in the care of this patient. High MDM     Greater than 50%  of this time was spent counseling and coordinating care related to the above assessment and plan.  Lujean Sake, MD  Please contact Palliative Medicine Team phone at [phone] for questions and concerns.

## 2024-04-30 NOTE — Progress Notes (Signed)
  Progress Note   Patient: Wayne Walls:[id number] DOB: 20-Feb-1933 DOA: 04/24/2024     6 DOS: the patient was seen and examined on 04/30/2024   Brief hospital course: 89 year old gentleman history of advanced dementia, COPD, depression who presented to ED after being found down at memory care unit with imaging consistent with a left femoral neck fracture.  Orthopedics consulted and following.  Patient to undergo left femoral neck fracture repair for palliation as well on 04/28/2024.  Patient's mental status noted to decline during the hospitalization concern for delirium versus possible aspiration pneumonia.  Patient placed empirically on IV antibiotics.  Palliative care consulted and following.   Assessment and Plan: #1 left femoral neck fracture -Secondary to mechanical fall with details unclear as patient noted with severe advanced dementia. - Patient seen in consultation by orthopedics, now s/p surgery 5/27 - Pain management, DVT prophylaxis per orthopedics.   2.  Acute metabolic encephalopathy/?  Delirium -Patient lethargic initially had been, drowsy, opens eyes to verbal stimuli, answers a few questions and drifts back off to sleep. - Patient noted to have some coughing with oral intake as noted per RN on 04/26/2024.. - Due to patient's altered mental status patient placed empirically on IV Unasyn to cover for suspected aspiration pneumonia  - IV fluids, supportive care. -Was more interactive and conversant on 5/28. However, pt is more lethargic this AM   2.  Advanced dementia - Continue home regimen Aricept  when able to take oral intake.   - recommend Haldol as needed for agitation.   - Palliative care consulted and following.     3.  COPD - Continue Incruse, Dulera, DuoNebs as needed.    4.  Dysphagia - Patient seen SLP, now on dysphagia 1 diet with nectar thick liquids per SLP - High risk for aspiration. Family is aware   5.  Dehydration - IV fluids  6. Bladder outlet  obstruction -Has required I/O caths overnight and this AM. If pt needs I/O cath for >24hrs, then would recommend indwelling foley -Given aspiration risk, did not order PO flomax. Family aware      Subjective: More lethargic this AM. Not very conversant at all  Physical Exam: Vitals:   04/29/24 1700 04/30/24 0449 04/30/24 0733 04/30/24 1415  BP:  125/71  136/71  Pulse:  74  79  Resp:  17  18  Temp:  98.9 F (37.2 C)  97.7 F (36.5 C)  TempSrc:    Oral  SpO2: 93% 97% 96% 96%  Weight:      Height:       General exam: Laying in bed, in no acute distress Respiratory system: normal chest rise, clear, no audible wheezing Cardiovascular system: regular rhythm, s1-s2 Gastrointestinal system: Nondistended, nontender, pos BS Central nervous system: No seizures, no tremors Extremities: No cyanosis, no joint deformities Skin: No rashes, no pallor Psychiatry: Unable to assess given mentation  Data Reviewed:  Labs reviewed: Na 145, K 3.5, Cr 0.99, WBC 5.9, Hgb 11.7  Family Communication: Pt in room, family at bedside  Disposition: Status is: Inpatient Remains inpatient appropriate because: severity of illness  Planned Discharge Destination: Home    Author: Cherylle Corwin, MD 04/30/2024 5:43 PM  For on call review [email or web address].

## 2024-04-30 NOTE — Progress Notes (Signed)
 Speech Language Pathology Treatment: Dysphagia  Patient Details Name: Wayne Walls MRN: [id number] DOB: Jun 26, 1933 Today's Date: 04/30/2024 Time: 0865-7846 SLP Time Calculation (min) (ACUTE ONLY): 16 min  Assessment / Plan / Recommendation Clinical Impression  SLP met with family re pt's care plan including pt's DIL, Devra Fontana, who is a retired SLP and son, [name].  Pt was receiving catheter care for portion of discussion - therefore spoke to them outside of the room initially.  Answered questions addressing diet - and Devra Fontana is requesting puree/nectar diet due to her concern for pt overtly coughing with thin water .  Advised that SLP cannot assure pt is not aspirating even thicker consistencies and if he is aspirating purees, this could cause more pulmonary complications/discomfort. She states pt was not coughing with applesauce and slightly thicker liquids that she administered to him today.  She states he did not manage foods requiring mastication well.   Devra Fontana reported pt was overtly coughing with thin liquids/intake earlier and that he appeared uncomfortable. SLP questioned if pt could tolerate tsps of thin liquids - but DIL stated this was still too much for him. Of note, premorbidly Devra Fontana reports pt has no coughing with intake but would require extra time to masticate and clear solids.  Discussed comfort feeding with known risks, to which they are agreeable.     Son reports he wants pt to return to prior level of functioning - stating he was eating/drinking well and was recently on a boat.  SLP is concerned for adequacy of nutrition and hydration given his AMS and ongoing s/s of aspiration.     Did not see pt with po intake as he was lethargic with open mouth breathing and not appropriate for po at this time.  Discussed option of conducting MBS *(only when pt is fully alert - to determine potential effective compensation strategies, least restrictive diet as well as exercises to improve  swallow function.  Devra Fontana wishes to proceed with MBS when pt able to participate with decreased discomfort.     Reviewed that pt has right facial asymmetry as well as right lingual deviation noted during SLP session yesterday.  Right facial asymmetry was seen on prior exam (2022) prompting brain imaging that was negative.  Uncertain if pt had lingual deviation at that time as it was not listed as a clinical indication for prior brain image.  Son and DIL reports pt had CT of brain during this hospital stay but state that he has not had an MRI- - therefore they question if he may have had a stroke that could account for his dysphagia.  Defer to MD- Full note to be filed and SLP will follow up.  Precautions posted and requested RN obtain regulator for oral suction.   Recommend intake only when pt fully alert - encouraging him to FEED himself- oral care TID.  Today pt is lethargic - whereas during session yesterday, he was alert and participative.     HPI HPI: Patient is an 89 y.o. male with PMH: advanced dementia, COPD, depression. He presented to the hospital on 04/24/24 from his memory care facility after being found down. He suffered a left femoral neck fracture. He has been lethargic. RN noted him to have some coughing with PO's on 04/26/24 - and was made NPO after SLP evaluation.  He underwent surgery for left hip fracture and SLP follow up was indicated.  PO for comfort reasonable option for this pt with advanced dementia and palliative is on board.  SLP was informed by nurse that family wished to speak to me - when I had reached out to her today via secure chat.  Therefore SLP followed up with pt/family.      SLP Plan  Continue with current plan of care;Other (Comment) (MBS and/or comfort po intake)      Recommendations for follow up therapy are one component of a multi-disciplinary discharge planning process, led by the attending physician.  Recommendations may be updated based on patient status,  additional functional criteria and insurance authorization.    Recommendations  Diet recommendations: Dysphagia 1 (puree);Nectar-thick liquid (per DIL wishes, retired SLP) Liquids provided via: Cup;No straw Medication Administration: Other (Comment) (with applesauce) Supervision: Full supervision/cueing for compensatory strategies;Trained caregiver to feed patient Compensations: Slow rate;Small sips/bites Postural Changes and/or Swallow Maneuvers: Seated upright 90 degrees;Upright 30-60 min after meal                  Oral care QID     Dysphagia, oropharyngeal phase (R13.12)     Continue with current plan of care;Other (Comment) (MBS when pt fully alert, pain is managed to determine helpful compensation strategies and appropriate exercises and COMFORT INTAKE).      Maudie Sorrow, MS [facility] SLP Acute Rehab Services Office [phone]  Chantal Comment  04/30/2024, 3:16 PM

## 2024-05-01 DIAGNOSIS — S72002A Fracture of unspecified part of neck of left femur, initial encounter for closed fracture: Secondary | ICD-10-CM | POA: Diagnosis not present

## 2024-05-01 LAB — COMPREHENSIVE METABOLIC PANEL WITH GFR
ALT: 29 U/L (ref 0–44)
AST: 34 U/L (ref 15–41)
Albumin: 2.3 g/dL — ABNORMAL LOW (ref 3.5–5.0)
Alkaline Phosphatase: 95 U/L (ref 38–126)
Anion gap: 7 (ref 5–15)
BUN: 21 mg/dL (ref 8–23)
CO2: 22 mmol/L (ref 22–32)
Calcium: 7.8 mg/dL — ABNORMAL LOW (ref 8.9–10.3)
Chloride: 117 mmol/L — ABNORMAL HIGH (ref 98–111)
Creatinine, Ser: 0.66 mg/dL (ref 0.61–1.24)
GFR, Estimated: >60 mL/min (ref >60)
Glucose, Bld: 106 mg/dL — ABNORMAL HIGH (ref 70–99)
Potassium: 3.4 mmol/L — ABNORMAL LOW (ref 3.5–5.1)
Sodium: 146 mmol/L — ABNORMAL HIGH (ref 135–145)
Total Bilirubin: 1.4 mg/dL — ABNORMAL HIGH (ref 0.0–1.2)
Total Protein: 5.1 g/dL — ABNORMAL LOW (ref 6.5–8.1)

## 2024-05-01 LAB — CBC
HCT: 39.3 % (ref 39.0–52.0)
Hemoglobin: 12.3 g/dL — ABNORMAL LOW (ref 13.0–17.0)
MCH: 32.7 pg (ref 26.0–34.0)
MCHC: 31.3 g/dL (ref 30.0–36.0)
MCV: 104.5 fL — ABNORMAL HIGH (ref 80.0–100.0)
Platelets: 180 10*3/uL (ref 150–400)
RBC: 3.76 MIL/uL — ABNORMAL LOW (ref 4.22–5.81)
RDW: 13.3 % (ref 11.5–15.5)
WBC: 6.6 10*3/uL (ref 4.0–10.5)
nRBC: 0 % (ref 0.0–0.2)

## 2024-05-01 MED ORDER — ALBUMIN HUMAN 25 % IV SOLN
25.0000 g | Freq: Once | INTRAVENOUS | Status: AC
  Administered 2024-05-01: 25 g via INTRAVENOUS
  Filled 2024-05-01: qty 100

## 2024-05-01 MED ORDER — TRAMADOL HCL 50 MG PO TABS: 50.0000 mg | ORAL_TABLET | Freq: Four times a day (QID) | ORAL | 0 refills | Status: DC | PRN

## 2024-05-01 MED ORDER — NYSTATIN 100000 UNIT/ML MT SUSP
5.0000 mL | Freq: Four times a day (QID) | OROMUCOSAL | Status: DC
  Administered 2024-05-01 – 2024-05-05 (×11): 500000 [IU] via OROMUCOSAL
  Filled 2024-05-01 (×12): qty 5

## 2024-05-01 NOTE — TOC Progression Note (Signed)
 Transition of Care [facility]) - Progression Note    Patient Details  Name: Wayne Walls MRN: [id number] Date of Birth: 08-08-1933  Transition of [facility]) CM/SW Contact  Bari Leys, RN Phone Number: 05/01/2024, 9:58 AM  Clinical Narrative:   No SNF bed offers at this time, Level 2 PASRR [id number]      Barriers to Discharge: Continued Medical Work up  Expected Discharge Plan and Services                                               Social Determinants of Health (SDOH) Interventions SDOH Screenings   Food Insecurity: Patient Unable To Answer (04/24/2024)  Housing: Patient Unable To Answer (04/24/2024)  Transportation Needs: Patient Unable To Answer (04/24/2024)  Utilities: Patient Unable To Answer (04/24/2024)  Social Connections: Patient Unable To Answer (04/24/2024)  Tobacco Use: Low Risk  (04/28/2024)    Readmission Risk Interventions    04/27/2024    3:14 PM  Readmission Risk Prevention Plan  Transportation Screening Complete  PCP or Specialist Appt within 3-5 Days Complete  HRI or Home Care Consult Complete  Social Work Consult for Recovery Care Planning/Counseling Complete  Palliative Care Screening Complete  Medication Review Oceanographer) Complete

## 2024-05-01 NOTE — Progress Notes (Addendum)
 Speech Language Pathology Treatment: Dysphagia  Patient Details Name: Wayne Walls MRN: [id number] DOB: 1933-03-14 Today's Date: 05/01/2024 Time: 4098-1191 SLP Time Calculation (min) (ACUTE ONLY): 30 min  Assessment / Plan / Recommendation Clinical Impression  Patient seen today for SLP to address dysphagia goals. Wayne Walls his daughter is in town to help with him and was feeding him breakfast upon SLP entrance to room. Patient appeared very xerostomia with coating on tongue and in posterior and on the soft palate which was improved with brushing and oral suction. Question if patient may have oral candidiasis. SLP set up suction and encouraged daughter to use an anterior mouth if needed. Patient provided with nectar thick water and yogurt. Hand overhand assist used to support execution of motor planning which improved patient's participation. Subjectively patient with minimally delayed swallow but no overt indication of aspiration. A few subswallows noted with pures. Eating patient is a prolonged process which may negatively impact nutrition but with family support and helping him to eat they can help maximize p.o. Patient benefited from cues at times to initiate swallow a few occasions during the session. He remained sleepy throughout session but would awaken and participate with p.o. intake.     HPI HPI: Patient is an 89 y.o. male with PMH: advanced dementia, COPD, depression. He presented to the hospital on 04/24/24 from his memory care facility after being found down. He suffered a left femoral neck fracture. He has been lethargic. RN noted him to have some coughing with PO's on 04/26/24 - and was made NPO after SLP evaluation.  He underwent surgery for left hip fracture and SLP follow up was indicated.  PO for comfort reasonable option for this pt with advanced dementia and palliative is on board.  SLP was informed by nurse that family wished to speak to me - when I had reached out to her today via  secure chat.  Therefore SLP followed up with pt/family.      SLP Plan  Continue with current plan of care      Recommendations for follow up therapy are one component of a multi-disciplinary discharge planning process, led by the attending physician.  Recommendations may be updated based on patient status, additional functional criteria and insurance authorization.    Recommendations  Diet recommendations: Dysphagia 1 (puree);Nectar-thick liquid Liquids provided via: Cup;Teaspoon;No straw Medication Administration: Crushed with puree Supervision: Trained caregiver to feed patient Compensations: Slow rate;Small sips/bites Postural Changes and/or Swallow Maneuvers: Seated upright 90 degrees;Upright 30-60 min after meal                  Oral care BID     Dysphagia, unspecified (R13.10)     Continue with current plan of care    Maudie Sorrow, MS [facility] SLP Acute Rehab Services Office [phone]  Chantal Comment  05/01/2024, 11:12 AM

## 2024-05-01 NOTE — TOC Progression Note (Addendum)
 Transition of Care [facility]) - Progression Note    Patient Details  Name: Wayne Walls MRN: [id number] Date of Birth: Jan 17, 1933  [facility]) CM/SW Contact  Bari Leys, RN Phone Number: 05/01/2024, 12:09 PM  Clinical Narrative:  Met with patient's dtr, [name], at bedside to review short term rehab/SNF bed offer ([facility]), states she will discuss with her brother and notify NCM of decision.   -2:35pm NCM reached out to: [facility] for short term rehab bed offer, rep-Whitney, made bed offer; [facility] rep-Nikki, not accepting [employer] right now; [facility], rep-Starr, no beds until possibly middle of next week; [facility] rep-Brittany, no reply at this time. Expected discharge 6/2 pending PT. Met with pt's dtr again at bedside, still no decision on accepting bed offer at [facility], states he will review with her brother and notify NCM.   -2:41pm Text received from [facility] with [employer] on behalf of [place], [place] [phone], inquiring on SNF choice, NCM informed [facility] pt only has one SNF bed offer from [facility], family is discussing bed offer acceptance. Will update [facility] Record with family decision.       Barriers to Discharge: Continued Medical Work up  Expected Discharge Plan and Services                                               Social Determinants of Health (SDOH) Interventions SDOH Screenings   Food Insecurity: Patient Unable To Answer (04/24/2024)  Housing: Patient Unable To Answer (04/24/2024)  Transportation Needs: Patient Unable To Answer (04/24/2024)  Utilities: Patient Unable To Answer (04/24/2024)  Social Connections: Patient Unable To Answer (04/24/2024)  Tobacco Use: Low Risk  (04/28/2024)    Readmission Risk Interventions    04/27/2024    3:14 PM  Readmission Risk Prevention Plan  Transportation Screening Complete  PCP or Specialist Appt within 3-5 Days Complete  HRI or Home Care Consult Complete  Social Work Consult  for Recovery Care Planning/Counseling Complete  Palliative Care Screening Complete  Medication Review Oceanographer) Complete

## 2024-05-01 NOTE — Progress Notes (Signed)
 Speech Language Pathology Treatment: Dysphagia  Patient Details Name: Wayne Walls MRN: [id number] DOB: 1933/06/21 Today's Date: 05/01/2024 Time: 4098-1191 SLP Time Calculation (min) (ACUTE ONLY): 20 min  Assessment / Plan / Recommendation Clinical Impression  Separate session focused on establishing premorbid information, progress, goals, etc. As feeding pt is a prolong process, recommend nutrition consult to help patient maximize his intake, especially as his daughter, Wayne Walls, reports patient has lactose intolerance. Of note upon questioning Wayne Walls admits patient's voice was not completely strong prior to admission. She also endorses that his right facial asymmetry was new-Per chart review this was observed previously in 2022- and patient had undergone brain imaging that was negative. She states *and Wayne Walls confirms via phone* that it has resolved. Reviewed swallow precautions and compensations to mitigate aspiration risk using teach back and written instructions. Pt's cough and voice are weak = thus advised Wayne Walls to encourage patient to increase strength of his voice and cough for maximal airway protection. Wayne Walls inquired about the modified barium swallow test and SLP advised that if patient's mentation does not consistently improve that it would likely not change his care plan. Will follow-up next week for dysphagia management to help with rehab swallow and mitigate aspiration risk. Spoke to RN and posted signs.   HPI HPI: Patient is an 89 y.o. male with PMH: advanced dementia, COPD, depression. He presented to the hospital on 04/24/24 from his memory care facility after being found down. He suffered a left femoral neck fracture. He has been lethargic. RN noted him to have some coughing with PO's on 04/26/24 - and was made NPO after SLP evaluation.  He underwent surgery for left hip fracture and SLP follow up was indicated.  PO for comfort reasonable option for this pt with advanced dementia and palliative  is on board.  SLP was informed by nurse that family wished to speak to me - when I had reached out to her today via secure chat.      SLP Plan  Continue with current plan of care      Recommendations for follow up therapy are one component of a multi-disciplinary discharge planning process, led by the attending physician.  Recommendations may be updated based on patient status, additional functional criteria and insurance authorization.    Recommendations  Diet recommendations: Dysphagia 1 (puree);Nectar-thick liquid Liquids provided via: Cup;Teaspoon Medication Administration: Crushed with puree Supervision: Trained caregiver to feed patient Compensations: Minimize environmental distractions;Slow rate;Small sips/bites Postural Changes and/or Swallow Maneuvers: Seated upright 90 degrees;Upright 30-60 min after meal (start intake with liquids)                  Oral care BID     Dysphagia, unspecified (R13.10)     Continue with current plan of care    Wayne Sorrow, MS [facility] SLP Acute Rehab Services Office [phone]  Wayne Walls Comment  05/01/2024, 11:18 AM

## 2024-05-01 NOTE — Progress Notes (Signed)
  Progress Note   Patient: Wayne Walls ZOX:[id number] DOB: [birth year] DOA: 04/24/2024     7 DOS: the patient was seen and examined on 05/01/2024   Brief hospital course: 89 year old gentleman history of advanced dementia, COPD, depression who presented to ED after being found down at memory care unit with imaging consistent with a left femoral neck fracture.  Orthopedics consulted and following.  Patient to undergo left femoral neck fracture repair for palliation as well on 04/28/2024.  Patient's mental status noted to decline during the hospitalization concern for delirium versus possible aspiration pneumonia.  Patient placed empirically on IV antibiotics.  Palliative care consulted and following.   Assessment and Plan: #1 left femoral neck fracture -Secondary to mechanical fall with details unclear as patient noted with severe advanced dementia. - Patient seen in consultation by orthopedics, now s/p surgery 5/27 - Pain management, DVT prophylaxis per orthopedics.   2.  Acute metabolic encephalopathy/?  Delirium -Patient lethargic initially had been, drowsy, opens eyes to verbal stimuli, answers a few questions and drifts back off to sleep. - Patient noted to have some coughing with oral intake as noted per RN on 04/26/2024.. - Due to patient's altered mental status patient placed empirically on IV Unasyn to cover for suspected aspiration pneumonia  - IV fluids, supportive care. -Was more interactive and conversant on 5/28. However, pt remains more lethargic. Per RN, pt was asleep overnight -Seen by palliative care. Recs to cont supportive care and "treat the treatable" for 48-72hrs. Palliative Care discussed hospice with family   2.  Advanced dementia - Continue home regimen Aricept  when able to take oral intake.   - recommend Haldol as needed for agitation.   - Palliative care consulted and following, per above   3.  COPD - Continue Incruse, Dulera, DuoNebs as needed.    4.   Dysphagia - Patient seen SLP, now on dysphagia 1 diet with nectar thick liquids per SLP - High risk for aspiration. Family is aware   5.  Dehydration - IV fluids  6. Bladder outlet obstruction -Has required I/O caths for >24hrs. Now with indwelling foley cath -Given aspiration risk, did not order PO flomax. Family aware      Subjective: Unable to assess given mentation  Physical Exam: Vitals:   04/30/24 2127 05/01/24 0544 05/01/24 0736 05/01/24 1314  BP: (!) 143/82 128/76  130/60  Pulse: 83 77  78  Resp: 18 18  16   Temp: 97.9 F (36.6 C) 97.7 F (36.5 C)  98.2 F (36.8 C)  TempSrc: Axillary Axillary  Oral  SpO2: 100% 93% 93% 91%  Weight:      Height:       General exam: laying in bed, in nad Respiratory system: Normal respiratory effort, no wheezing Cardiovascular system: regular rate, s1, s2 Gastrointestinal system: Soft, nondistended, positive BS Central nervous system: CN2-12 grossly intact, strength intact Extremities: Perfused, no clubbing Skin: Normal skin turgor, no notable skin lesions seen Psychiatry: Unable to assess given mentation  Data Reviewed:  Labs reviewed: Na 146, K 3.4, Cr 0.66, WBC 6.6, Hgb 12.3, Plts 180  Family Communication: Pt in room, family at bedside  Disposition: Status is: Inpatient Remains inpatient appropriate because: severity of illness  Planned Discharge Destination: Home    Author: Cherylle Corwin, MD 05/01/2024 4:53 PM  For on call review [email or web address].

## 2024-05-01 NOTE — Progress Notes (Signed)
 Daily Progress Note   Patient Name: Wayne Walls       Date: 05/01/2024 DOB: 12-09-1932  Age: 89 y.o. MRN#: [id number] Attending Physician: Oral Billings, MD Primary Care Physician: Shannan Dart., FNP Admit Date: 04/24/2024  Reason for Consultation/Follow-up: Establishing goals of care  Subjective: Patient noted to be resting in bed, not as awake as on 5-29, daughter present at bedside. Patient attempts to awaken some, attempts to respond/verbalize some however, not as alert as on 04-30-2024 Discussed with daughter at bedside Discussed with SLP colleague   Length of Stay: 7  Current Medications: Scheduled Meds:   aspirin   150 mg Rectal Daily   Chlorhexidine Gluconate Cloth  6 each Topical Daily   docusate sodium  100 mg Oral BID   donepezil   5 mg Oral Daily   enoxaparin  (LOVENOX ) injection  40 mg Subcutaneous Q24H   fluticasone furoate-vilanterol  1 puff Inhalation Daily   liver oil-zinc oxide   Topical BID   mupirocin ointment  1 Application Nasal BID   pantoprazole  (PROTONIX ) IV  40 mg Intravenous QHS   umeclidinium bromide  1 puff Inhalation Daily    Continuous Infusions:  ampicillin-sulbactam (UNASYN) IV 3 g (05/01/24 1057)   diltiazem (CARDIZEM) infusion     lactated ringers 75 mL/hr at 04/30/24 1536    PRN Meds: acetaminophen **OR** acetaminophen, food thickener, ipratropium-albuterol , ketorolac, menthol-cetylpyridinium **OR** phenol, metoCLOPramide **OR** metoCLOPramide (REGLAN) injection, morphine injection, nystatin cream, ondansetron **OR** ondansetron (ZOFRAN) IV, mouth rinse, oxyCODONE  Physical Exam         Resting in bed, no distress Not as awake alert Lungs clear to auscultation in the anterior lung fields No acute distress Appears chronically  ill Appears with generalized weakness  Vital Signs: BP 128/76 (BP Location: Right Arm)   Pulse 77   Temp 97.7 F (36.5 C) (Axillary)   Resp 18   Ht 6\' 1"  (1.854 m)   Wt 80.3 kg   SpO2 93%   BMI 23.36 kg/m  SpO2: SpO2: 93 % O2 Device: O2 Device: Nasal Cannula O2 Flow Rate: O2 Flow Rate (L/min): 3 L/min  Intake/output summary:  Intake/Output Summary (Last 24 hours) at 05/01/2024 1207 Last data filed at 05/01/2024 1059 Gross per 24 hour  Intake 1318.37 ml  Output 1500 ml  Net -181.63 ml  LBM: Last BM Date : 04/29/24 Baseline Weight: Weight: 80.3 kg Most recent weight: Weight: 80.3 kg       Palliative Assessment/Data:      Patient Active Problem List   Diagnosis Date Noted   Palliative care encounter 04/27/2024   Goals of care, counseling/discussion 04/27/2024   DNR (do not resuscitate) 04/27/2024   Counseling and coordination of care 04/27/2024   Delirium 04/27/2024   Need for emotional support 04/27/2024   Closed fracture of left hip (HCC) 04/27/2024   Acute metabolic encephalopathy 04/26/2024   Dehydration 04/26/2024   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   Closed left hip fracture, initial encounter (HCC) 04/24/2024   Dementia without behavioral disturbance (HCC) 08/24/2021   Gait abnormality 08/24/2021   Stroke (HCC) 12/19/2020   Frequent falls 12/19/2020   Dysphagia 12/19/2020   Polycythemia 12/19/2020   Facial droop    Acute respiratory failure with hypoxia (HCC)    Malignant neoplasm of prostate (HCC) 01/26/2019   Elevated prostate specific antigen (PSA) 01/04/2019   Collagenous colitis 07/23/2018   Diverticulosis 06/09/2018   History of adenomatous polyp of colon 06/09/2018   Internal hemorrhoids 06/09/2018   Melena 10/31/2017   OSA (obstructive sleep apnea) 07/13/2015   Syncope 10/22/2013    Palliative Care Assessment & Plan   Patient Profile:    Assessment: 89 year old gentleman, history of dementia, resident at [facility] memory care  unit, history of COPD depression, admitted with left femoral neck fracture, orthopedics consulted and patient to go for left hip hemiarthroplasty for displaced left hip femoral neck fracture on 04-28-2024.  Palliative care consulted for ongoing goals of care discussions.  Recommendations/Plan: Palliative care continues to follow for goals of care discussions and for monitoring hospital status.  Physical therapy note reviewed, discussed with SLP in detail, discussed with daughter present at bedside about monitoring hospital course.  Discussed about the need for further conversations regarding a more comfort-focused approach after a time-limited trial of current interventions is over, answered all of her questions to the best of my ability.   Code Status:    Code Status Orders  (From admission, onward)           Start     Ordered   04/27/24 1034  Do not attempt resuscitation (DNR)- Limited -Do Not Intubate (DNI)  (Code Status)  Continuous       Question Answer Comment  If pulseless and not breathing No CPR or chest compressions.   In Pre-Arrest Conditions (Patient Is Breathing and Has A Pulse) Do not intubate. Provide all appropriate non-invasive medical interventions. Avoid ICU transfer unless indicated or required.   Consent: Discussion documented in EHR or advanced directives reviewed      04/27/24 1034           Code Status History     Date Active Date Inactive Code Status Order ID Comments User Context   04/26/2024 1830 04/27/2024 1034 Full Code [id number]  Armenta Landau, MD Inpatient   04/24/2024 1448 04/26/2024 1830 Full Code [id number]  Gaylin Ke, MD ED   12/19/2020 2158 12/23/2020 2057 Full Code [id number]  Howerter, Gattis Kass, DO ED       Prognosis:  Unable to determine  Discharge Planning: To Be Determined  Care plan was discussed with patient's family present at bedside Also discussed with  SLP colleague. Thank you for allowing the Palliative Medicine Team  to assist in the care of this patient. MOD MDM     Greater than 50%  of this time was spent counseling and coordinating care related to the above assessment and plan.  Lujean Sake, MD  Please contact Palliative Medicine Team phone at [phone] for questions and concerns.

## 2024-05-01 NOTE — Plan of Care (Signed)
  Problem: Coping: Goal: Level of anxiety will decrease Outcome: Progressing   Problem: Pain Managment: Goal: General experience of comfort will improve and/or be controlled Outcome: Progressing   Problem: Safety: Goal: Ability to remain free from injury will improve Outcome: Progressing   Problem: Pain Management: Goal: Pain level will decrease Outcome: Progressing

## 2024-05-01 NOTE — Progress Notes (Signed)
 Patient ID: Wayne Walls, male   DOB: 11-04-1933, 89 y.o.   MRN: [id number] The patient is awake but significantly demented.  His daughter is at the bedside.  They have struggled with his pain even postoperative.  He is waiting placement.  His left hip dressing is intact.  His leg lengths are near equal.  He does exhibit pain with attempts of motion of his left hip.  No further recommendations from orthopedic standpoint.

## 2024-05-02 DIAGNOSIS — S72002A Fracture of unspecified part of neck of left femur, initial encounter for closed fracture: Secondary | ICD-10-CM | POA: Diagnosis not present

## 2024-05-02 LAB — CBC
HCT: 38.3 % — ABNORMAL LOW (ref 39.0–52.0)
Hemoglobin: 11.9 g/dL — ABNORMAL LOW (ref 13.0–17.0)
MCH: 32.8 pg (ref 26.0–34.0)
MCHC: 31.1 g/dL (ref 30.0–36.0)
MCV: 105.5 fL — ABNORMAL HIGH (ref 80.0–100.0)
Platelets: 174 10*3/uL (ref 150–400)
RBC: 3.63 MIL/uL — ABNORMAL LOW (ref 4.22–5.81)
RDW: 13.4 % (ref 11.5–15.5)
WBC: 5.7 10*3/uL (ref 4.0–10.5)
nRBC: 0 % (ref 0.0–0.2)

## 2024-05-02 LAB — COMPREHENSIVE METABOLIC PANEL WITH GFR
ALT: 32 U/L (ref 0–44)
AST: 39 U/L (ref 15–41)
Albumin: 2.5 g/dL — ABNORMAL LOW (ref 3.5–5.0)
Alkaline Phosphatase: 105 U/L (ref 38–126)
Anion gap: 7 (ref 5–15)
BUN: 21 mg/dL (ref 8–23)
CO2: 26 mmol/L (ref 22–32)
Calcium: 8 mg/dL — ABNORMAL LOW (ref 8.9–10.3)
Chloride: 114 mmol/L — ABNORMAL HIGH (ref 98–111)
Creatinine, Ser: 0.83 mg/dL (ref 0.61–1.24)
GFR, Estimated: >60 mL/min (ref >60)
Glucose, Bld: 103 mg/dL — ABNORMAL HIGH (ref 70–99)
Potassium: 3.3 mmol/L — ABNORMAL LOW (ref 3.5–5.1)
Sodium: 147 mmol/L — ABNORMAL HIGH (ref 135–145)
Total Bilirubin: 1.6 mg/dL — ABNORMAL HIGH (ref 0.0–1.2)
Total Protein: 5.3 g/dL — ABNORMAL LOW (ref 6.5–8.1)

## 2024-05-02 MED ORDER — LACTATED RINGERS IV SOLN: INTRAVENOUS | Status: DC

## 2024-05-02 NOTE — Plan of Care (Signed)
  Problem: Health Behavior/Discharge Planning: Goal: Ability to manage health-related needs will improve Outcome: Not Progressing   Problem: Clinical Measurements: Goal: Ability to maintain clinical measurements within normal limits will improve Outcome: Not Progressing   Problem: Activity: Goal: Risk for activity intolerance will decrease Outcome: Not Progressing   Problem: Pain Managment: Goal: General experience of comfort will improve and/or be controlled Outcome: Not Progressing

## 2024-05-02 NOTE — Progress Notes (Signed)
 Daily Progress Note   Patient Name: Wayne Walls       Date: 05/02/2024 DOB: 1933-09-22  Age: 89 y.o. MRN#: [id number] Attending Physician: Oral Billings, MD Primary Care Physician: Shannan Dart., FNP Admit Date: 04/24/2024  Reason for Consultation/Follow-up: Establishing goals of care  Subjective: Patient undergoing patient care at the time of visit early this a.m. Chart reviewed Length of Stay: 8  Current Medications: Scheduled Meds:   aspirin   150 mg Rectal Daily   docusate sodium   100 mg Oral BID   donepezil   5 mg Oral Daily   enoxaparin  (LOVENOX ) injection  40 mg Subcutaneous Q24H   fluticasone  furoate-vilanterol  1 puff Inhalation Daily   liver oil-zinc  oxide   Topical BID   mupirocin  ointment  1 Application Nasal BID   nystatin   5 mL Mouth/Throat QID   pantoprazole  (PROTONIX ) IV  40 mg Intravenous QHS   umeclidinium bromide   1 puff Inhalation Daily    Continuous Infusions:  ampicillin -sulbactam (UNASYN ) IV Stopped (05/02/24 1241)   diltiazem  (CARDIZEM ) infusion     lactated ringers  Stopped (05/02/24 1208)    PRN Meds: acetaminophen  **OR** acetaminophen , food thickener, ipratropium-albuterol , ketorolac , menthol -cetylpyridinium **OR** phenol, metoCLOPramide  **OR** metoCLOPramide  (REGLAN ) injection, morphine  injection, nystatin  cream, ondansetron  **OR** ondansetron  (ZOFRAN ) IV, mouth rinse, oxyCODONE   Physical Exam         Resting in bed, no distress  No acute distress Appears chronically ill Appears with generalized weakness  Vital Signs: BP 127/74 (BP Location: Left Arm)   Pulse 75   Temp 98 F (36.7 C) (Oral)   Resp 18   Ht 6\' 1"  (1.854 m)   Wt 80.3 kg   SpO2 91%   BMI 23.36 kg/m  SpO2: SpO2: 91 % O2 Device: O2 Device: Room Air O2 Flow Rate: O2  Flow Rate (L/min): 3 L/min  Intake/output summary:  Intake/Output Summary (Last 24 hours) at 05/02/2024 1521 Last data filed at 05/02/2024 1517 Gross per 24 hour  Intake 1081.73 ml  Output 750 ml  Net 331.73 ml   LBM: Last BM Date : 04/29/24 Baseline Weight: Weight: 80.3 kg Most recent weight: Weight: 80.3 kg       Palliative Assessment/Data:      Patient Active Problem List   Diagnosis Date Noted   Palliative care encounter 04/27/2024  Goals of care, counseling/discussion 04/27/2024   DNR (do not resuscitate) 04/27/2024   Counseling and coordination of care 04/27/2024   Delirium 04/27/2024   Need for emotional support 04/27/2024   Closed fracture of left hip (HCC) 04/27/2024   Acute metabolic encephalopathy 04/26/2024   Dehydration 04/26/2024   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   Closed left hip fracture, initial encounter (HCC) 04/24/2024   Dementia without behavioral disturbance (HCC) 08/24/2021   Gait abnormality 08/24/2021   Stroke (HCC) 12/19/2020   Frequent falls 12/19/2020   Dysphagia 12/19/2020   Polycythemia 12/19/2020   Facial droop    Acute respiratory failure with hypoxia (HCC)    Malignant neoplasm of prostate (HCC) 01/26/2019   Elevated prostate specific antigen (PSA) 01/04/2019   Collagenous colitis 07/23/2018   Diverticulosis 06/09/2018   History of adenomatous polyp of colon 06/09/2018   Internal hemorrhoids 06/09/2018   Melena 10/31/2017   OSA (obstructive sleep apnea) 07/13/2015   Syncope 10/22/2013    Palliative Care Assessment & Plan   Patient Profile:    Assessment: 89 year old gentleman, history of dementia, resident at [facility] memory care unit, history of COPD depression, admitted with left femoral neck fracture, orthopedics consulted and patient to go for left hip hemiarthroplasty for displaced left hip femoral neck fracture on 04-28-2024.  Palliative care consulted for ongoing goals of care  discussions.  Recommendations/Plan: Palliative care continues to follow for goals of care discussions and for monitoring hospital course, TOC note reviewed, search underway for appropriate SNF for rehab facility.  Recommend addition of outpatient palliative services.  No new inpatient palliative specific recommendations at this time.    Code Status:    Code Status Orders  (From admission, onward)           Start     Ordered   04/27/24 1034  Do not attempt resuscitation (DNR)- Limited -Do Not Intubate (DNI)  (Code Status)  Continuous       Question Answer Comment  If pulseless and not breathing No CPR or chest compressions.   In Pre-Arrest Conditions (Patient Is Breathing and Has A Pulse) Do not intubate. Provide all appropriate non-invasive medical interventions. Avoid ICU transfer unless indicated or required.   Consent: Discussion documented in EHR or advanced directives reviewed      04/27/24 1034           Code Status History     Date Active Date Inactive Code Status Order ID Comments User Context   04/26/2024 1830 04/27/2024 1034 Full Code [id number]  Armenta Landau, MD Inpatient   04/24/2024 1448 04/26/2024 1830 Full Code [id number]  Gaylin Ke, MD ED   12/19/2020 2158 12/23/2020 2057 Full Code [id number]  Howerter, Gattis Kass, DO ED       Prognosis:  Unable to determine  Discharge Planning: To Be Determined  Care plan was discussed with nursing colleagues.  Thank you for allowing the Palliative Medicine Team to assist in the care of this patient. low MDM     Greater than 50%  of this time was spent counseling and coordinating care related to the above assessment and plan.  Lujean Sake, MD  Please contact Palliative Medicine Team phone at [phone] for questions and concerns.

## 2024-05-02 NOTE — Progress Notes (Signed)
  Progress Note   Patient: Wayne Walls:[id number] DOB: Oct 14, 1933 DOA: 04/24/2024     8 DOS: the patient was seen and examined on 05/02/2024   Brief hospital course: 89 year old gentleman history of advanced dementia, COPD, depression who presented to ED after being found down at memory care unit with imaging consistent with a left femoral neck fracture.  Orthopedics consulted and following.  Patient to undergo left femoral neck fracture repair for palliation as well on 04/28/2024.  Patient's mental status noted to decline during the hospitalization concern for delirium versus possible aspiration pneumonia.  Patient placed empirically on IV antibiotics.  Palliative care consulted and following.   Assessment and Plan: #1 left femoral neck fracture -Secondary to mechanical fall with details unclear as patient noted with severe advanced dementia. - Patient seen in consultation by orthopedics, now s/p surgery 5/27 - Pain management, DVT prophylaxis per orthopedics.   2.  Acute metabolic encephalopathy/?  Delirium -Patient lethargic initially had been, drowsy, opens eyes to verbal stimuli, answers a few questions and drifts back off to sleep. - Patient noted to have some coughing with oral intake as noted per RN on 04/26/2024.. - Due to patient's altered mental status patient placed empirically on IV Unasyn  to cover for suspected aspiration pneumonia  - resumed IVF, supportive care. -Was more interactive and conversant on 5/28. However, pt remains more lethargic. Per RN, pt was asleep overnight -Seen by palliative care. Recs to cont supportive care and "treat the treatable" for 48-72hrs. Palliative Care discussed hospice with family   2.  Advanced dementia - Continue home regimen Aricept  when able to take oral intake.   - recommend Haldol  as needed for agitation.   - Palliative care consulted and following, per above   3.  COPD - Continue Incruse, Dulera, DuoNebs as needed.    4.   Dysphagia - Patient seen SLP, now on dysphagia 1 diet with nectar thick liquids per SLP - High risk for aspiration. Family is aware   5.  Dehydration - IV fluids  6. Bladder outlet obstruction -Has required I/O caths for >24hrs. Now with indwelling foley cath -Given aspiration risk, did not order PO flomax. Family aware  7. Dehydration -Membranes appear dry. -Poor PO intake noted -Na trending up to 147 -Resumed LR at 100cc/hr      Subjective: Unable to assess given mentation  Physical Exam: Vitals:   05/01/24 2116 05/02/24 0612 05/02/24 1023 05/02/24 1029  BP: 126/66 139/79  127/74  Pulse: 79 85  75  Resp: 18 14  18   Temp: 98.2 F (36.8 C) 97.9 F (36.6 C)  98 F (36.7 C)  TempSrc: Oral Oral  Oral  SpO2: 92% 94% 92% 91%  Weight:      Height:       General exam:Laying in bed, in no acute distress, at times conversant Respiratory system: normal chest rise, clear, no audible wheezing Cardiovascular system: regular rhythm, s1-s2 Gastrointestinal system: Nondistended, nontender, pos BS Central nervous system: No seizures, no tremors Extremities: No cyanosis, no joint deformities Skin: No rashes, no pallor Psychiatry: Difficult to assess given mentation  Data Reviewed:  Labs reviewed: Na 147, K 3.3, Cr 0.83, WBC 5.7, Hgb 11.9, Plts 174  Family Communication: Pt in room, family at bedside  Disposition: Status is: Inpatient Remains inpatient appropriate because: severity of illness  Planned Discharge Destination: Home    Author: Cherylle Corwin, MD 05/02/2024 4:38 PM  For on call review [email or web address].

## 2024-05-02 NOTE — Progress Notes (Signed)
 Subjective: 4 Days Post-Op Procedure(s) (LRB): LEFT HIP HEMIARTHROPLASTY, ANTERIOR APPROACH (Left) Patient sleeping, daughter bedside.   Objective: Vital signs in last 24 hours: Temp:  [97.9 F (36.6 C)-98.2 F (36.8 C)] 98 F (36.7 C) (05/31 1029) Pulse Rate:  [75-85] 75 (05/31 1029) Resp:  [14-18] 18 (05/31 1029) BP: (126-139)/(60-79) 127/74 (05/31 1029) SpO2:  [91 %-94 %] 91 % (05/31 1029) FiO2 (%):  [32 %] 32 % (05/31 1023)  Intake/Output from previous day: 05/30 0701 - 05/31 0700 In: 1333.1 [P.O.:200; I.V.:431.4; IV Piggyback:581.7] Out: 1100 [Urine:1100] Intake/Output this shift: No intake/output data recorded.  Recent Labs    04/30/24 0340 05/01/24 0338 05/02/24 0337  HGB 11.7* 12.3* 11.9*   Recent Labs    05/01/24 0338 05/02/24 0337  WBC 6.6 5.7  RBC 3.76* 3.63*  HCT 39.3 38.3*  PLT 180 174   Recent Labs    05/01/24 0338 05/02/24 0337  NA 146* 147*  K 3.4* 3.3*  CL 117* 114*  CO2 22 26  BUN 21 21  CREATININE 0.66 0.83  GLUCOSE 106* 103*  CALCIUM  7.8* 8.0*   No results for input(s): "LABPT", "INR" in the last 72 hours.  Incision: scant drainage Compartment soft   Assessment/Plan: 4 Days Post-Op Procedure(s) (LRB): LEFT HIP HEMIARTHROPLASTY, ANTERIOR APPROACH (Left) Awaiting SNF placement.      Wayne Walls 05/02/2024, 11:11 AM

## 2024-05-03 DIAGNOSIS — S72002A Fracture of unspecified part of neck of left femur, initial encounter for closed fracture: Secondary | ICD-10-CM | POA: Diagnosis not present

## 2024-05-03 LAB — COMPREHENSIVE METABOLIC PANEL WITH GFR
ALT: 36 U/L (ref 0–44)
AST: 40 U/L (ref 15–41)
Albumin: 2.4 g/dL — ABNORMAL LOW (ref 3.5–5.0)
Alkaline Phosphatase: 113 U/L (ref 38–126)
Anion gap: 7 (ref 5–15)
BUN: 21 mg/dL (ref 8–23)
CO2: 25 mmol/L (ref 22–32)
Calcium: 7.8 mg/dL — ABNORMAL LOW (ref 8.9–10.3)
Chloride: 113 mmol/L — ABNORMAL HIGH (ref 98–111)
Creatinine, Ser: 0.7 mg/dL (ref 0.61–1.24)
GFR, Estimated: >60 mL/min (ref >60)
Glucose, Bld: 116 mg/dL — ABNORMAL HIGH (ref 70–99)
Potassium: 3.2 mmol/L — ABNORMAL LOW (ref 3.5–5.1)
Sodium: 145 mmol/L (ref 135–145)
Total Bilirubin: 1.7 mg/dL — ABNORMAL HIGH (ref 0.0–1.2)
Total Protein: 5.2 g/dL — ABNORMAL LOW (ref 6.5–8.1)

## 2024-05-03 LAB — CBC
HCT: 37.8 % — ABNORMAL LOW (ref 39.0–52.0)
Hemoglobin: 12 g/dL — ABNORMAL LOW (ref 13.0–17.0)
MCH: 33.4 pg (ref 26.0–34.0)
MCHC: 31.7 g/dL (ref 30.0–36.0)
MCV: 105.3 fL — ABNORMAL HIGH (ref 80.0–100.0)
Platelets: 179 10*3/uL (ref 150–400)
RBC: 3.59 MIL/uL — ABNORMAL LOW (ref 4.22–5.81)
RDW: 13.5 % (ref 11.5–15.5)
WBC: 5.9 10*3/uL (ref 4.0–10.5)
nRBC: 0 % (ref 0.0–0.2)

## 2024-05-03 MED ORDER — POTASSIUM CHLORIDE 10 MEQ/100ML IV SOLN
10.0000 meq | INTRAVENOUS | Status: AC
  Administered 2024-05-03 (×3): 10 meq via INTRAVENOUS
  Filled 2024-05-03 (×4): qty 100

## 2024-05-03 MED ORDER — POTASSIUM CHLORIDE 10 MEQ/100ML IV SOLN
10.0000 meq | INTRAVENOUS | Status: AC
  Administered 2024-05-03 (×3): 10 meq via INTRAVENOUS
  Filled 2024-05-03 (×2): qty 100

## 2024-05-03 MED ORDER — CHLORHEXIDINE GLUCONATE CLOTH 2 % EX PADS
6.0000 | MEDICATED_PAD | Freq: Every day | CUTANEOUS | Status: DC
  Administered 2024-05-03 – 2024-05-05 (×3): 6 via TOPICAL

## 2024-05-03 NOTE — Progress Notes (Signed)
 Progress Note   Patient: Wayne Walls YNW:[id number] DOB: [birth year] DOA: 04/24/2024     9 DOS: the patient was seen and examined on 05/03/2024   Brief hospital course: 89 year old gentleman history of advanced dementia, COPD, depression who presented to ED after being found down at memory care unit with imaging consistent with a left femoral neck fracture.  Orthopedics consulted and following.  Patient to undergo left femoral neck fracture repair for palliation as well on 04/28/2024.  Patient's mental status noted to decline during the hospitalization concern for delirium versus possible aspiration pneumonia.  Patient placed empirically on IV antibiotics.  Palliative care consulted and following.   Assessment and Plan: #1 left femoral neck fracture -Secondary to mechanical fall with details unclear as patient noted with severe advanced dementia. - Patient seen in consultation by orthopedics, now s/p surgery 5/27 - Pain management, DVT prophylaxis per orthopedics.   2.  Acute metabolic encephalopathy/?  Delirium -Patient lethargic initially had been, drowsy, opens eyes to verbal stimuli, answers a few questions and drifts back off to sleep. - Patient noted to have some coughing with oral intake as noted per RN on 04/26/2024.. - Due to patient's altered mental status patient placed empirically on IV Unasyn  to cover for suspected aspiration pneumonia  - continuous IVF, supportive care. -Was more interactive and conversant on 5/28. However, pt remains more lethargic.  -Seen by palliative care. Recs to cont supportive care and "treat the treatable." Palliative care has discussed hospice with family -as of  6/1, decision was limited trial of NG feed. If still no improvement, then possible hospice. Have placed consult for dietitian   2.  Advanced dementia - Continue home regimen Aricept  when able to take oral intake.   - recommend Haldol  as needed for agitation.   - Palliative care consulted and  following, per above   3.  COPD - Continue Incruse, Dulera, DuoNebs as needed.    4.  Dysphagia - Patient seen SLP, now on dysphagia 1 diet with nectar thick liquids per SLP - High risk for aspiration. Family is aware   5.  Dehydration - IV fluids  6. Bladder outlet obstruction -Has required I/O caths for >24hrs. Now with indwelling foley cath -Given aspiration risk, did not order PO flomax. Family aware  7. Dehydration -Membranes appear dry. -Poor PO intake noted -cont LR at 100cc/hr      Subjective: Unable to assess given mentation  Physical Exam: Vitals:   05/03/24 0620 05/03/24 0908 05/03/24 1408 05/03/24 1412  BP: (!) 140/76  (!) 123/58   Pulse: 86  65   Resp: 17     Temp: 98.1 F (36.7 C)  97.7 F (36.5 C)   TempSrc:   Oral   SpO2: 94% 92% (!) 88% 92%  Weight:      Height:       General exam: Laying in bed, in nad Respiratory system: Normal respiratory effort, no wheezing Cardiovascular system: regular rate, s1, s2 Gastrointestinal system: Soft, nondistended, positive BS Central nervous system: CN2-12 grossly intact, strength intact Extremities: Perfused, no clubbing Skin: Normal skin turgor, no notable skin lesions seen Psychiatry: Difficult to assess given mentation   Data Reviewed:  Labs reviewed: Na 145, K 3.2, Cr 0.70, WBC 5.9, Hgb 12.0  Family Communication: Pt in room, family at bedside  Disposition: Status is: Inpatient Remains inpatient appropriate because: severity of illness  Planned Discharge Destination: Skilled nursing facility    Author: Cherylle Corwin, MD 05/03/2024 2:21 PM  For  on call review [email or web address].

## 2024-05-03 NOTE — Plan of Care (Signed)

## 2024-05-03 NOTE — Progress Notes (Signed)
 Pt has very poor effort at doing inhalers(DPI). Family and RN at bedside.

## 2024-05-03 NOTE — Progress Notes (Signed)
 Daily Progress Note   Patient Name: Wayne Walls       Date: 05/03/2024 DOB: [birth year]  Age: 89 y.o. MRN#: [id number] Attending Physician: Oral Billings, MD Primary Care Physician: Shannan Dart., FNP Admit Date: 04/24/2024  Reason for Consultation/Follow-up: Establishing goals of care  Subjective: Patient resting in bed, eyes open periodically, daughter Avanell Bob at bedside, daughter-in-law on the phone, nursing colleague also present in the room, palliative services following for ongoing goals of care discussions. Chart reviewed Overall, patient continues with minimal oral intake, with ongoing fatigue and debility, goals of care discussions undertaken again, see below.   Chart reviewed Length of Stay: 9  Current Medications: Scheduled Meds:   aspirin   150 mg Rectal Daily   Chlorhexidine  Gluconate Cloth  6 each Topical Daily   docusate sodium   100 mg Oral BID   donepezil   5 mg Oral Daily   enoxaparin  (LOVENOX ) injection  40 mg Subcutaneous Q24H   fluticasone  furoate-vilanterol  1 puff Inhalation Daily   liver oil-zinc  oxide   Topical BID   nystatin   5 mL Mouth/Throat QID   pantoprazole  (PROTONIX ) IV  40 mg Intravenous QHS   umeclidinium bromide   1 puff Inhalation Daily    Continuous Infusions:  ampicillin -sulbactam (UNASYN ) IV 3 g (05/03/24 1036)   diltiazem  (CARDIZEM ) infusion     lactated ringers  100 mL/hr at 05/03/24 1244   potassium chloride 10 mEq (05/03/24 1241)    PRN Meds: acetaminophen  **OR** acetaminophen , food thickener, ipratropium-albuterol , ketorolac , menthol -cetylpyridinium **OR** phenol, metoCLOPramide  **OR** metoCLOPramide  (REGLAN ) injection, morphine  injection, nystatin  cream, ondansetron  **OR** ondansetron  (ZOFRAN ) IV, mouth rinse, oxyCODONE   Physical Exam          Resting in bed, no distress Awake episodically, does not verbalize much, does not interact much. No acute distress Appears chronically ill Appears with generalized weakness  Vital Signs: BP (!) 140/76 (BP Location: Left Arm)   Pulse 86   Temp 98.1 F (36.7 C)   Resp 17   Ht 6\' 1"  (1.854 m)   Wt 80.3 kg   SpO2 92%   BMI 23.36 kg/m  SpO2: SpO2: 92 % O2 Device: O2 Device: Nasal Cannula O2 Flow Rate: O2 Flow Rate (L/min): 3 L/min  Intake/output summary:  Intake/Output Summary (Last 24 hours) at 05/03/2024 1321 Last data filed at 05/03/2024 1100  Gross per 24 hour  Intake 370.04 ml  Output 800 ml  Net -429.96 ml   LBM: Last BM Date : 04/30/24 Baseline Weight: Weight: 80.3 kg Most recent weight: Weight: 80.3 kg       Palliative Assessment/Data:      Patient Active Problem List   Diagnosis Date Noted   Palliative care encounter 04/27/2024   Goals of care, counseling/discussion 04/27/2024   DNR (do not resuscitate) 04/27/2024   Counseling and coordination of care 04/27/2024   Delirium 04/27/2024   Need for emotional support 04/27/2024   Closed fracture of left hip (HCC) 04/27/2024   Acute metabolic encephalopathy 04/26/2024   Dehydration 04/26/2024   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   Closed left hip fracture, initial encounter (HCC) 04/24/2024   Dementia without behavioral disturbance (HCC) 08/24/2021   Gait abnormality 08/24/2021   Stroke (HCC) 12/19/2020   Frequent falls 12/19/2020   Dysphagia 12/19/2020   Polycythemia 12/19/2020   Facial droop    Acute respiratory failure with hypoxia (HCC)    Malignant neoplasm of prostate (HCC) 01/26/2019   Elevated prostate specific antigen (PSA) 01/04/2019   Collagenous colitis 07/23/2018   Diverticulosis 06/09/2018   History of adenomatous polyp of colon 06/09/2018   Internal hemorrhoids 06/09/2018   Melena 10/31/2017   OSA (obstructive sleep apnea) 07/13/2015   Syncope 10/22/2013    Palliative Care  Assessment & Plan   Patient Profile:    Assessment: 89 year old gentleman, history of dementia, resident at [facility] memory care unit, history of COPD depression, admitted with left femoral neck fracture, orthopedics consulted and patient to go for left hip hemiarthroplasty for displaced left hip femoral neck fracture on 04-28-2024.  Palliative care consulted for ongoing goals of care discussions.  Recommendations/Plan: Goals of care discussions undertaken with the patient's daughter, daughter-in-law also present on the phone.  Nursing colleague also present in the room, also discussed with hospital medicine colleague.  Overall, patient ongoing debility and weakness, not much in the way of oral intake or awakeness or alertness.  Concern for whether the patient will be able to tolerate SNF rehab attempt continues.  Hence, goals of care discussions undertaken again.  Patient's family voiced concerns that the patient might have a current presentation because he has not been eating drinking well for the past several days now even before hip surgery.  They want to give him a time-limited trial of artificial feeding.  They are opposed to insertion of PEG tube however they would like to explore the option of NG tube and temporary feeding tube trial in the hopes that this will help patient's mental status and provide him with nutrition and boost his energy.  Overall, they remain hopeful that the patient will take steps towards stabilization and recovery and ability to be able to go to SNF rehab towards the end of this hospitalization.  Ongoing discussions with them about patient's current condition and current decline trajectory.  Dementia trajectory also explained.  All of their questions addressed to the best of my ability.  Also corresponded with hospital medicine colleague.  Palliative services will continue to follow along.  Code Status:    Code Status Orders  (From admission, onward)            Start     Ordered   04/27/24 1034  Do not attempt resuscitation (DNR)- Limited -Do Not Intubate (DNI)  (Code Status)  Continuous       Question Answer Comment  If pulseless and not breathing  No CPR or chest compressions.   In Pre-Arrest Conditions (Patient Is Breathing and Has A Pulse) Do not intubate. Provide all appropriate non-invasive medical interventions. Avoid ICU transfer unless indicated or required.   Consent: Discussion documented in EHR or advanced directives reviewed      04/27/24 1034           Code Status History     Date Active Date Inactive Code Status Order ID Comments User Context   04/26/2024 1830 04/27/2024 1034 Full Code [id number]  Armenta Landau, MD Inpatient   04/24/2024 1448 04/26/2024 1830 Full Code [id number]  Gaylin Ke, MD ED   12/19/2020 2158 12/23/2020 2057 Full Code [id number]  Howerter, Gattis Kass, DO ED       Prognosis:  Unable to determine  Discharge Planning: To Be Determined  Care plan was discussed with nursing colleague, hospital medicine attending, daughter [name] at bedside as well as daughter-in-law on the phone. Thank you for allowing the Palliative Medicine Team to assist in the care of this patient. high MDM     Greater than 50%  of this time was spent counseling and coordinating care related to the above assessment and plan.  Lujean Sake, MD  Please contact Palliative Medicine Team phone at [phone] for questions and concerns.

## 2024-05-04 DIAGNOSIS — S72002A Fracture of unspecified part of neck of left femur, initial encounter for closed fracture: Secondary | ICD-10-CM | POA: Diagnosis not present

## 2024-05-04 LAB — COMPREHENSIVE METABOLIC PANEL WITH GFR
ALT: 34 U/L (ref 0–44)
AST: 38 U/L (ref 15–41)
Albumin: 2.4 g/dL — ABNORMAL LOW (ref 3.5–5.0)
Alkaline Phosphatase: 125 U/L (ref 38–126)
Anion gap: 9 (ref 5–15)
BUN: 21 mg/dL (ref 8–23)
CO2: 23 mmol/L (ref 22–32)
Calcium: 8 mg/dL — ABNORMAL LOW (ref 8.9–10.3)
Chloride: 113 mmol/L — ABNORMAL HIGH (ref 98–111)
Creatinine, Ser: 0.77 mg/dL (ref 0.61–1.24)
GFR, Estimated: >60 mL/min (ref >60)
Glucose, Bld: 106 mg/dL — ABNORMAL HIGH (ref 70–99)
Potassium: 3.7 mmol/L (ref 3.5–5.1)
Sodium: 145 mmol/L (ref 135–145)
Total Bilirubin: 1.9 mg/dL — ABNORMAL HIGH (ref 0.0–1.2)
Total Protein: 5.1 g/dL — ABNORMAL LOW (ref 6.5–8.1)

## 2024-05-04 LAB — CBC
HCT: 37.5 % — ABNORMAL LOW (ref 39.0–52.0)
Hemoglobin: 12 g/dL — ABNORMAL LOW (ref 13.0–17.0)
MCH: 33.1 pg (ref 26.0–34.0)
MCHC: 32 g/dL (ref 30.0–36.0)
MCV: 103.6 fL — ABNORMAL HIGH (ref 80.0–100.0)
Platelets: 182 10*3/uL (ref 150–400)
RBC: 3.62 MIL/uL — ABNORMAL LOW (ref 4.22–5.81)
RDW: 13.4 % (ref 11.5–15.5)
WBC: 5.5 10*3/uL (ref 4.0–10.5)
nRBC: 0 % (ref 0.0–0.2)

## 2024-05-04 MED ORDER — PANTOPRAZOLE SODIUM 40 MG PO TBEC: 40.0000 mg | DELAYED_RELEASE_TABLET | Freq: Every day | ORAL | Status: DC

## 2024-05-04 MED ORDER — ASPIRIN 81 MG PO TBEC
81.0000 mg | DELAYED_RELEASE_TABLET | Freq: Every day | ORAL | Status: DC
  Filled 2024-05-04: qty 1

## 2024-05-04 MED ORDER — OXYCODONE HCL 5 MG PO TABS: 2.5000 mg | ORAL_TABLET | ORAL | 0 refills | Status: AC | PRN

## 2024-05-04 MED ORDER — ASPIRIN 300 MG RE SUPP: 150.0000 mg | Freq: Every day | RECTAL | 0 refills | Status: DC

## 2024-05-04 MED ORDER — CARMEX CLASSIC LIP BALM EX OINT
TOPICAL_OINTMENT | CUTANEOUS | Status: DC | PRN
  Filled 2024-05-04: qty 10

## 2024-05-04 NOTE — Progress Notes (Signed)
 Brief Nutrition Note  Received consult for assessment of nutrition requirements and enteral/tube feeding initiation and management.  Per chart review and discussion with attending Dr. Joan Mouton and per palliative care's note today, patient now transitioning to comfort care with plan for residential hospice.  No further nutrition interventions planned at this time.   Scheryl Cushing RD, LDN Contact via [employer].

## 2024-05-04 NOTE — Progress Notes (Signed)
 Physical Therapy Discharge Patient Details Name: Wayne Walls MRN: [id number] DOB: 1933-08-21 Today's Date: 05/04/2024   History of Present Illness 89 yo male  admitted 04/24/24 after a fall sustained  left femoral neck fracture. Surgery delayed  as family deciding  on surg vs. conservative.',Patient S/P  left hip anterior bipolar hemiarthroplasy. WUJ:[id number], COPD, glaucoma, poss. Stroke    PT Comments  Pt dtr present at bedside; pt alerts to name, otherwise stuporous.  pt moving UEs of his own volition. LLE repositioned for pressure relief with pt moaning briefly indicating pain L hip;  HOB elevated with slight improvement in O2 sats; 88-89% on 4L  Discussed plan with dtr they desire no further PT at this time, agree with plan given pt level of alertness and declining medical status. Encouraged dtr to call for anything she may need for herself as well.  PT signing off    If plan is discharge home, recommend the following: Two people to help with walking and/or transfers;Two people to help with bathing/dressing/bathroom;Assistance with feeding;Supervision due to cognitive status   Can travel by private vehicle        Equipment Recommendations  None recommended by PT    Recommendations for Other Services       Precautions / Restrictions Restrictions LLE Weight Bearing Per Provider Order: Weight bearing as tolerated     Mobility  Bed Mobility                    Transfers                        Ambulation/Gait                   Stairs             Wheelchair Mobility     Tilt Bed    Modified Rankin (Stroke Patients Only)       Balance                                            Communication Communication Communication: Impaired Factors Affecting Communication: Difficulty expressing self;Reduced clarity of speech  Cognition Arousal: Stuporous Behavior During Therapy: Flat affect   PT - Cognitive impairments:  History of cognitive impairments                                Cueing    Exercises      General Comments General comments (skin integrity, edema, etc.): pt moving UEs of his own volition. LLE repositioned for pressure relief. HOB elevated with slight improvement in O2 sats; 88-89% on 4L      Pertinent Vitals/Pain Pain Assessment Pain Assessment: PAINAD Breathing: normal Negative Vocalization: occasional moan/groan, low speech, negative/disapproving quality Facial Expression: smiling or inexpressive Body Language: relaxed Consolability: no need to console PAINAD Score: 1 Pain Location: moaning with repositioning LLE    Home Living                          Prior Function            PT Goals (current goals can now be found in the care plan section) Acute Rehab PT Goals PT Goal Formulation: With family Progress towards PT goals: Not progressing toward  goals - comment (d/c PT--declining medical status)    Frequency           PT Plan      Co-evaluation              AM-PAC PT "6 Clicks" Mobility   Outcome Measure  Help needed turning from your back to your side while in a flat bed without using bedrails?: Total Help needed moving from lying on your back to sitting on the side of a flat bed without using bedrails?: Total Help needed moving to and from a bed to a chair (including a wheelchair)?: Total Help needed standing up from a chair using your arms (e.g., wheelchair or bedside chair)?: Total Help needed to walk in hospital room?: Total Help needed climbing 3-5 steps with a railing? : Total 6 Click Score: 6    End of Session   Activity Tolerance: Patient limited by lethargy Patient left: in bed;with call bell/phone within reach;with bed alarm set;with family/visitor present         Time: 1610-9604 PT Time Calculation (min) (ACUTE ONLY): 10 min  Charges:    $Self Care/Home Management: 8-22 PT General Charges $$ ACUTE PT  VISIT: 1 Visit                     Alexandria Ida, PT  Acute Rehab Dept [facility]) [phone]  05/04/2024    [facility] 05/04/2024, 10:47 AM

## 2024-05-04 NOTE — Progress Notes (Signed)
 Daily Progress Note   Patient Name: Wayne Walls       Date: 05/04/2024 DOB: [birth year]  Age: 89 y.o. MRN#: [id number] Attending Physician: Oral Billings, MD Primary Care Physician: Shannan Dart., FNP Admit Date: 04/24/2024  Reason for Consultation/Follow-up: Establishing goals of care  Subjective: Patient resting in bed, eyes open periodically, daughter [name] and son [name] at bedside. Patient appears weak, with mildly labored work of breathing  Length of Stay: 10  Current Medications: Scheduled Meds:   aspirin  EC  81 mg Oral Daily   Chlorhexidine  Gluconate Cloth  6 each Topical Daily   docusate sodium   100 mg Oral BID   donepezil   5 mg Oral Daily   enoxaparin  (LOVENOX ) injection  40 mg Subcutaneous Q24H   fluticasone  furoate-vilanterol  1 puff Inhalation Daily   liver oil-zinc  oxide   Topical BID   nystatin   5 mL Mouth/Throat QID   pantoprazole   40 mg Oral QHS   umeclidinium bromide   1 puff Inhalation Daily    Continuous Infusions:  diltiazem  (CARDIZEM ) infusion     lactated ringers  100 mL/hr at 05/04/24 0929    PRN Meds: acetaminophen  **OR** acetaminophen , food thickener, ipratropium-albuterol , ketorolac , lip balm, menthol -cetylpyridinium **OR** phenol, metoCLOPramide  **OR** metoCLOPramide  (REGLAN ) injection, morphine  injection, nystatin  cream, ondansetron  **OR** ondansetron  (ZOFRAN ) IV, mouth rinse, oxyCODONE   Physical Exam         Resting in bed, mild distress Awake episodically, does not verbalize much, does not interact much. Increased work of breathing.  Appears chronically ill Appears with generalized weakness  Vital Signs: BP 133/80 (BP Location: Left Arm)   Pulse 84 Comment: recheck,  probe dropped off finger  Temp 97.8 F (36.6 C)   Resp 16   Ht 6\' 1"   (1.854 m)   Wt 80.3 kg   SpO2 90%   BMI 23.36 kg/m  SpO2: SpO2: 90 % O2 Device: O2 Device: Nasal Cannula O2 Flow Rate: O2 Flow Rate (L/Walls): 3.5 L/Walls  Intake/output summary:  Intake/Output Summary (Last 24 hours) at 05/04/2024 1050 Last data filed at 05/04/2024 1000 Gross per 24 hour  Intake 2433.66 ml  Output 450 ml  Net 1983.66 ml   LBM: Last BM Date : 04/30/24 Baseline Weight: Weight: 80.3 kg Most recent weight: Weight: 80.3 kg       Palliative  Assessment/Data:      Patient Active Problem List   Diagnosis Date Noted   Palliative care encounter 04/27/2024   Goals of care, counseling/discussion 04/27/2024   DNR (do not resuscitate) 04/27/2024   Counseling and coordination of care 04/27/2024   Delirium 04/27/2024   Need for emotional support 04/27/2024   Closed fracture of left hip (HCC) 04/27/2024   Acute metabolic encephalopathy 04/26/2024   Dehydration 04/26/2024   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   Closed left hip fracture, initial encounter (HCC) 04/24/2024   Dementia without behavioral disturbance (HCC) 08/24/2021   Gait abnormality 08/24/2021   Stroke (HCC) 12/19/2020   Frequent falls 12/19/2020   Dysphagia 12/19/2020   Polycythemia 12/19/2020   Facial droop    Acute respiratory failure with hypoxia (HCC)    Malignant neoplasm of prostate (HCC) 01/26/2019   Elevated prostate specific antigen (PSA) 01/04/2019   Collagenous colitis 07/23/2018   Diverticulosis 06/09/2018   History of adenomatous polyp of colon 06/09/2018   Internal hemorrhoids 06/09/2018   Melena 10/31/2017   OSA (obstructive sleep apnea) 07/13/2015   Syncope 10/22/2013    Palliative Care Assessment & Plan   Patient Profile:    Assessment: 89 year old gentleman, history of dementia, resident at [facility] memory care unit, history of COPD depression, admitted with left femoral neck fracture, orthopedics consulted and patient to go for left hip hemiarthroplasty for displaced  left hip femoral neck fracture on 04-28-2024.  Palliative care consulted for ongoing goals of care discussions.  Recommendations/Plan: Goals of care discussions undertaken with the patient's son and daughter, comfort care no NGT prioritize comfort measures, seek residential hospice.  Escalating symptom burden of pain, shortness of breath, mild gurgling from possible aspiration.   Code Status:    Code Status Orders  (From admission, onward)           Start     Ordered   04/27/24 1034  Do not attempt resuscitation (DNR)- Limited -Do Not Intubate (DNI)  (Code Status)  Continuous       Question Answer Comment  If pulseless and not breathing No CPR or chest compressions.   In Pre-Arrest Conditions (Patient Is Breathing and Has A Pulse) Do not intubate. Provide all appropriate non-invasive medical interventions. Avoid ICU transfer unless indicated or required.   Consent: Discussion documented in EHR or advanced directives reviewed      04/27/24 1034           Code Status History     Date Active Date Inactive Code Status Order ID Comments User Context   04/26/2024 1830 04/27/2024 1034 Full Code [id number]  Armenta Landau, MD Inpatient   04/24/2024 1448 04/26/2024 1830 Full Code [id number]  Gaylin Ke, MD ED   12/19/2020 2158 12/23/2020 2057 Full Code [id number]  Howerter, Gattis Kass, DO ED       Prognosis:  Less than 2 weeks.  Discharge Planning: Inpatient hospice.   Care plan was discussed with nursing colleague, hospital medicine attending, daughter [name] and son [name] at bedside as well as TOC and hospice.  Thank you for allowing the Palliative Medicine Team to assist in the care of this patient. high MDM     Greater than 50%  of this time was spent counseling and coordinating care related to the above assessment and plan.  Lujean Sake, MD  Please contact Palliative Medicine Team phone at [phone] for questions and concerns.

## 2024-05-04 NOTE — Progress Notes (Signed)
    Pt referred to hospice services for Hospice home in [facility]. We unfortunately do not have a bed to offer today. Plan to follow and will meet with pt family in am to discuss hospice care and have MD review for appropriateness if family goals are in line with hospice philosophy and comfort.   Lyla Samuels RN [phone]

## 2024-05-04 NOTE — Plan of Care (Signed)
   Problem: Clinical Measurements: Goal: Will remain free from infection Outcome: Progressing   Problem: Education: Goal: Knowledge of General Education information will improve Description: Including pain rating scale, medication(s)/side effects and non-pharmacologic comfort measures Outcome: Not Progressing   Problem: Health Behavior/Discharge Planning: Goal: Ability to manage health-related needs will improve Outcome: Not Progressing

## 2024-05-04 NOTE — TOC Progression Note (Signed)
 Transition of Care [facility]) - Progression Note    Patient Details  Name: Wayne Walls MRN: [id number] Date of Birth: 1933/03/29  Transition of Care [facility]) CM/SW Contact  Bari Leys, RN Phone Number: 05/04/2024, 11:43 AM  Clinical Narrative:  Per Teams chat from Palliative Medicine Team, family agreeable to residential hospice with [facility], Lyla Samuels rep- for [facility] accepted referral, states no beds available at this time, will initiate work up so when bed available can move forward quickly. TOC will continue to follow.       Barriers to Discharge: Continued Medical Work up  Expected Discharge Plan and Services                                               Social Determinants of Health (SDOH) Interventions SDOH Screenings   Food Insecurity: Patient Unable To Answer (04/24/2024)  Housing: Patient Unable To Answer (04/24/2024)  Transportation Needs: Patient Unable To Answer (04/24/2024)  Utilities: Patient Unable To Answer (04/24/2024)  Social Connections: Patient Unable To Answer (04/24/2024)  Tobacco Use: Low Risk  (04/28/2024)    Readmission Risk Interventions    04/27/2024    3:14 PM  Readmission Risk Prevention Plan  Transportation Screening Complete  PCP or Specialist Appt within 3-5 Days Complete  HRI or Home Care Consult Complete  Social Work Consult for Recovery Care Planning/Counseling Complete  Palliative Care Screening Complete  Medication Review [employer]) Complete

## 2024-05-04 NOTE — Progress Notes (Signed)
  Progress Note   Patient: Wayne Walls WJX:[id number] DOB: [birth year] DOA: 04/24/2024     10 DOS: the patient was seen and examined on 05/04/2024   Brief hospital course: 89 year old gentleman history of advanced dementia, COPD, depression who presented to ED after being found down at memory care unit with imaging consistent with a left femoral neck fracture.  Orthopedics consulted and following.  Patient to undergo left femoral neck fracture repair for palliation as well on 04/28/2024.  Patient's mental status noted to decline during the hospitalization concern for delirium versus possible aspiration pneumonia.  Patient placed empirically on IV antibiotics.  Palliative care consulted and following.   Assessment and Plan: #1 left femoral neck fracture -Secondary to mechanical fall with details unclear as patient noted with severe advanced dementia. - Patient seen in consultation by orthopedics, now s/p surgery 5/27 - Pain management - Per below, now transition to comfort with plan to dispo to residential hospice   2.  Acute metabolic encephalopathy/?  Delirium -Patient lethargic initially had been, drowsy, opens eyes to verbal stimuli, answers a few questions and drifts back off to sleep. - Patient noted to have some coughing with oral intake as noted per RN on 04/26/2024.. - Given trial of Unasyn  - Patient continued to be mainly lethargic this  -Seen by palliative care. Recs to cont supportive care and "treat the treatable." -Pt did not significantly improve and was unable to tolerate much of PO -On discussion with Palliative Care, decision was made to transition to comfort care with plan for dispo to residential hospice   2.  Advanced dementia - Later transitioned to comfort measures   3.  COPD - Continue breathing treatments as needed -continue O2 for comfort   4.  Dysphagia - High risk for aspiration. Family is aware -Later focus on comfort only   5.  Dehydration - Pt was given  course of IV fluids  6. Bladder outlet obstruction -Noted this visit, later with foley cath placement -Continue foley cath for comfort  7. Dehydration -Pt was given trial of IVF hydration      Subjective: Cannot assess given mentation  Physical Exam: Vitals:   05/03/24 1412 05/03/24 2055 05/04/24 0615 05/04/24 1322  BP:  132/71 133/80 126/64  Pulse:  82 84 73  Resp:  20 16 18   Temp:  98.8 F (37.1 C) 97.8 F (36.6 C) 98.2 F (36.8 C)  TempSrc:      SpO2: 92% 93% 90% 91%  Weight:      Height:       General exam: Laying in bed, in no acute distress Respiratory system: normal chest rise, clear, no audible wheezing Cardiovascular system: regular rhythm, s1-s2 Gastrointestinal system: Nondistended, nontende Central nervous system: No seizures, no tremors Extremities: No cyanosis, no joint deformities Skin: No rashes, no pallor Psychiatry: Unable to assess given mentation  Data Reviewed:  Labs reviewed: Na 145, K 3.7, Cr 0.77, WBC 5.5, hgb 12.0, Plts 182  Family Communication: Pt in room, family at bedside  Disposition: Status is: Inpatient Remains inpatient appropriate because: severity of illness  Planned Discharge Destination: hospice    Author: Cherylle Corwin, MD 05/04/2024 5:53 PM  For on call review [email or web address].

## 2024-05-04 NOTE — Progress Notes (Signed)
 Subjective: 6 Days Post-Op Procedure(s) (LRB): LEFT HIP HEMIARTHROPLASTY, ANTERIOR APPROACH (Left) Patient with advanced dementia . Awakens put does not answer questions.   Objective: Vital signs in last 24 hours: Temp:  [97.7 F (36.5 C)-98.8 F (37.1 C)] 97.8 F (36.6 C) (06/02 0615) Pulse Rate:  [65-84] 84 (06/02 0615) Resp:  [16-20] 16 (06/02 0615) BP: (123-133)/(58-80) 133/80 (06/02 0615) SpO2:  [88 %-93 %] 90 % (06/02 0615)  Intake/Output from previous day: 06/01 0701 - 06/02 0700 In: 2949.9 [P.O.:560; I.V.:1466.5; IV Piggyback:923.4] Out: 550 [Urine:550] Intake/Output this shift: No intake/output data recorded.  Recent Labs    05/02/24 0337 05/03/24 0305 05/04/24 0324  HGB 11.9* 12.0* 12.0*   Recent Labs    05/03/24 0305 05/04/24 0324  WBC 5.9 5.5  RBC 3.59* 3.62*  HCT 37.8* 37.5*  PLT 179 182   Recent Labs    05/03/24 0305 05/04/24 0324  NA 145 145  K 3.2* 3.7  CL 113* 113*  CO2 25 23  BUN 21 21  CREATININE 0.70 0.77  GLUCOSE 116* 106*  CALCIUM  7.8* 8.0*   No results for input(s): "LABPT", "INR" in the last 72 hours.  Incision: moderate drainage Compartment soft   Assessment/Plan: 6 Days Post-Op Procedure(s) (LRB): LEFT HIP HEMIARTHROPLASTY, ANTERIOR APPROACH (Left) Planned discharge to SNF Dressing changed  Aspirin  for DVT prophylaxis     Wayne Walls 05/04/2024, 8:14 AM

## 2024-05-05 DIAGNOSIS — S72002A Fracture of unspecified part of neck of left femur, initial encounter for closed fracture: Secondary | ICD-10-CM | POA: Diagnosis not present

## 2024-05-05 DIAGNOSIS — S72002S Fracture of unspecified part of neck of left femur, sequela: Secondary | ICD-10-CM

## 2024-05-05 MED ORDER — PHENOL 1.4 % MT LIQD: 1.0000 | OROMUCOSAL | Status: AC | PRN

## 2024-05-05 MED ORDER — ONDANSETRON HCL 4 MG/2ML IJ SOLN: 4.0000 mg | Freq: Four times a day (QID) | INTRAMUSCULAR | Status: AC | PRN

## 2024-05-05 MED ORDER — MORPHINE SULFATE (PF) 2 MG/ML IV SOLN: 0.5000 mg | INTRAVENOUS | Status: AC | PRN

## 2024-05-05 MED ORDER — CARMEX CLASSIC LIP BALM EX OINT: TOPICAL_OINTMENT | CUTANEOUS | Status: AC | PRN

## 2024-05-05 MED ORDER — METOCLOPRAMIDE HCL 5 MG/ML IJ SOLN: 5.0000 mg | Freq: Three times a day (TID) | INTRAMUSCULAR | Status: AC | PRN

## 2024-05-05 MED ORDER — ORAL CARE MOUTH RINSE: 15.0000 mL | OROMUCOSAL | Status: AC | PRN

## 2024-05-05 MED ORDER — IPRATROPIUM-ALBUTEROL 0.5-2.5 (3) MG/3ML IN SOLN: 3.0000 mL | RESPIRATORY_TRACT | Status: AC | PRN

## 2024-05-05 NOTE — Progress Notes (Signed)
 [facility]--  Met with pt and family at bedside to discuss hospice services and transition to the Hospice home in [place]. The family is in agreement with the service agreement and paperwork has been completed for transfer. We have a bed to offer and will be able to accept pt today. Lyla Samuels RN [phone]

## 2024-05-05 NOTE — Care Management Important Message (Signed)
 Important Message  Patient Details No IM Letter given due to transferring with Hospice.  Name: ARIAN MURLEY MRN: [id number] Date of Birth: January 28, 1933   Important Message Given:  No     Curtiss Dowdy 05/05/2024, 2:06 PM

## 2024-05-05 NOTE — Discharge Summary (Signed)
 Physician Discharge Summary   Patient: Wayne Walls MRN: [id number] DOB: [birth year]  Admit date:     04/24/2024  Discharge date: 05/05/24  Discharge Physician: Wayne Walls   PCP: Wayne Walls., FNP   Recommendations at discharge:    Follow up with Hospice services Routine foley and IV care  Discharge Diagnoses: Principal Problem:   Closed left hip fracture, initial encounter [facility]) Active Problems:   Acute metabolic encephalopathy   Dysphagia   Dementia without behavioral disturbance (HCC)   COPD with emphysema (HCC)   Dehydration   Palliative care encounter   Goals of care, counseling/discussion   DNR (do not resuscitate)   Counseling and coordination of care   Delirium   Need for emotional support   Closed fracture of left hip (HCC)  Resolved Problems:   * No resolved hospital problems. *  Hospital Course: 89 year old gentleman history of advanced dementia, COPD, depression who presented to ED after being found down at memory care unit with imaging consistent with a left femoral neck fracture.  Orthopedics consulted and following.  Patient to undergo left femoral neck fracture repair for palliation as well on 04/28/2024.  Patient's mental status noted to decline during the hospitalization concern for delirium versus possible aspiration pneumonia.  Patient placed empirically on IV antibiotics.  Palliative care consulted and following.   Assessment and Plan: #1 left femoral neck fracture -Secondary to mechanical fall with details unclear as patient noted with severe advanced dementia. - Patient seen in consultation by orthopedics, now s/p surgery 5/27 - Pain management as needed - Per below, now transition to comfort with plan to dispo to residential hospice   2.  Acute metabolic encephalopathy/?  Delirium -Patient lethargic initially had been, drowsy, opens eyes to verbal stimuli, answers a few questions and drifts back off to sleep. - Patient noted to have some  coughing with oral intake as noted per RN on 04/26/2024.. - Given trial of Unasyn  - Patient continued to be mainly lethargic this admit -Seen by palliative care. Recs to cont supportive care and "treat the treatable." -Pt did not significantly improve and was unable to tolerate much of PO -On discussion with Palliative Care, decision was made to transition to comfort care with plan for dispo to residential hospice   2.  Advanced dementia - Later transitioned to comfort measures   3.  COPD - Continue breathing treatments as needed -continue O2 for comfort   4.  Dysphagia - High risk for aspiration. Family is aware -Later focus on comfort only   5.  Dehydration - Pt was given course of IV fluids   6. Bladder outlet obstruction -Noted this visit, later with foley cath placement -Continue foley cath for comfort   7. Dehydration -Pt was given trial of IVF hydration       Consultants: Orthopedic Surgery, Palliative Care Procedures performed: Left direct anterior hip bipolar hemiarthroplasty   Disposition: Hospice care Diet recommendation:  Comfort feed  DISCHARGE MEDICATION: Allergies as of 05/05/2024   No Known Allergies      Medication List     STOP taking these medications    cetirizine 10 MG tablet Commonly known as: ZYRTEC   colestipol 1 g tablet Commonly known as: COLESTID   cyanocobalamin 1000 MCG tablet Commonly known as: VITAMIN B12   donepezil  5 MG tablet Commonly known as: ARICEPT    fluticasone  50 MCG/ACT nasal spray Commonly known as: FLONASE   OCUSOFT LID SCRUB EX   Vitamin D-3 125 MCG (  5000 UT) Tabs       TAKE these medications    chlorhexidine  4 % external liquid Commonly known as: HIBICLENS  Apply 15 mLs (1 Application total) topically as directed for 30 doses. Use as directed daily for 5 days every other week for 6 weeks.   haloperidol  0.5 MG tablet Commonly known as: HALDOL  Take 0.5 mg by mouth 2 (two) times daily as needed for  agitation (sundowning).   ipratropium-albuterol  0.5-2.5 (3) MG/3ML Soln Commonly known as: DUONEB Take 3 mLs by nebulization every 2 (two) hours as needed.   lip balm ointment Apply topically as needed.   metoCLOPramide  5 MG/ML injection Commonly known as: REGLAN  Inject 1-2 mLs (5-10 mg total) into the vein every 8 (eight) hours as needed (if ondansetron  (ZOFRAN ) ineffective.).   morphine  (PF) 2 MG/ML injection Inject 0.25-0.5 mLs (0.5-1 mg total) into the vein every 2 (two) hours as needed.   mouth rinse Liqd solution 15 mLs by Mouth Rinse route as needed (for oral care).   nystatin  cream Commonly known as: MYCOSTATIN  Apply 1 Application topically 2 (two) times daily. Apply to bilateral groin  PRN order: 1 application to groin twice daily as needed  for yeast.   ondansetron  4 MG/2ML Soln injection Commonly known as: ZOFRAN  Inject 2 mLs (4 mg total) into the vein every 6 (six) hours as needed for nausea.   oxyCODONE  5 MG immediate release tablet Commonly known as: Oxy IR/ROXICODONE  Take 0.5 tablets (2.5 mg total) by mouth every 4 (four) hours as needed for moderate pain (pain score 4-6).   phenol 1.4 % Liqd Commonly known as: CHLORASEPTIC Use as directed 1 spray in the mouth or throat as needed for throat irritation / pain.        Contact information for follow-up providers     Wayne Lao, MD Follow up in 2 week(s).   Specialty: Orthopedic Surgery Why: As needed Contact information: [address] [place]  [place] [address] [phone]         Wayne Walls., FNP Follow up.   Specialty: Family Medicine Why: As needed Contact information: [address] [place] [address] [phone]         Follow up with hospice services Follow up.   Why: Hospital follow up             Contact information for after-discharge care     Destination     [facility] .   Service: Skilled Nursing Contact information: [address] [place]  [phone]                    Discharge Exam: Wayne Walls Weights   04/24/24 1510 04/25/24 1636  Weight: 80.3 kg 80.3 kg   General exam: Lying in bed, in nad Respiratory system: Normal respiratory effort, no audible wheezing Cardiovascular system: perfused, no notable JVD Gastrointestinal system: Soft, nondistended Central nervous system: no seizures, no tremors Extremities: Perfused, no clubbing Skin: Normal skin turgor, no notable skin lesions seen Psychiatry: Unable to assess given mentation  Condition at discharge: poor  The results of significant diagnostics from this hospitalization (including imaging, microbiology, ancillary and laboratory) are listed below for reference.   Imaging Studies: DG HIP UNILAT WITH PELVIS 1V LEFT Result Date: 04/28/2024 CLINICAL DATA:  Elective surgery. EXAM: DG HIP (WITH OR WITHOUT PELVIS) 1V*L* COMPARISON:  04/24/2024 FINDINGS: Three fluoroscopic spot views of the pelvis and left hip obtained in the operating room. Images during hip arthroplasty.  Fluoroscopy time 6 seconds. Dose 1.1663 mGy. IMPRESSION: Intraoperative fluoroscopy during left hip arthroplasty. Electronically Signed   By: Chadwick Colonel M.D.   On: 04/28/2024 18:20   DG C-Arm 1-60 Min-No Report Result Date: 04/28/2024 Fluoroscopy was utilized by the requesting physician.  No radiographic interpretation.   DG CHEST PORT 1 VIEW Result Date: 04/26/2024 CLINICAL DATA:  Altered mental status. EXAM: PORTABLE CHEST 1 VIEW COMPARISON:  Chest x-ray 04/24/2024 and older. FINDINGS: Enlarged cardiopericardial silhouette. Underinflation. Vascular congestion with increasing interstitial changes along the lower lung zones, right greater than left. Infiltrate versus edema. Possible effusions. No pneumothorax. Interface density change running vertically laterally along the left hemithorax very well could be sequela of overlapping shadow. This was  seen on previous examination as well. Osteopenia degenerative changes. IMPRESSION: Underinflation. Enlarged cardiopericardial silhouette with vascular congestion. Interstitial changes seen along the lung bases. Edema versus subtle infiltrate. Question small effusions. Recommend follow-up. Electronically Signed   By: Adrianna Horde M.D.   On: 04/26/2024 19:23   DG Hip Unilat W or Wo Pelvis 2-3 Views Left Result Date: 04/24/2024 CLINICAL DATA:  Hip pain status post fall. EXAM: DG HIP (WITH OR WITHOUT PELVIS) 2-3V LEFT COMPARISON:  None available FINDINGS: Acute, mildly impacted and angulated fracture of the LEFT femoral neck. Hernia repair mesh noted within the pelvis. IMPRESSION: Acute LEFT femoral neck fracture. Electronically Signed   By: Elester Grim M.D.   On: 04/24/2024 12:26   DG Chest 2 View Result Date: 04/24/2024 CLINICAL DATA:  Status post fall off.  RIGHT hip and knee pain. EXAM: CHEST - 2 VIEW COMPARISON:  12/19/2020 FINDINGS: Cardiomediastinal silhouette and pulmonary vasculature are within normal limits. Mild bibasilar atelectasis is present.  Lung are otherwise clear. Severe compression deformity of midthoracic vertebral body is new since prior chest radiograph from 07/06/2008, ultimately of uncertain chronicity. IMPRESSION: 1. No acute cardiopulmonary process. 2. Severe compression deformity of midthoracic vertebral body of uncertain chronicity. Electronically Signed   By: Elester Grim M.D.   On: 04/24/2024 12:25   DG Knee 2 Views Left Result Date: 04/24/2024 CLINICAL DATA:  RIGHT knee pain status post fall EXAM: LEFT KNEE - 1-2 VIEW COMPARISON:  None available FINDINGS: Mild soft tissue swelling overlying the patella. No fracture or dislocation. IMPRESSION: Mild soft tissue swelling overlying the patella without underlying fracture or dislocation. Electronically Signed   By: Elester Grim M.D.   On: 04/24/2024 12:23   CT Head Wo Contrast Result Date: 04/24/2024 CLINICAL DATA:  Minor head  trauma EXAM: CT HEAD WITHOUT CONTRAST TECHNIQUE: Contiguous axial images were obtained from the base of the skull through the vertex without intravenous contrast. RADIATION DOSE REDUCTION: This exam was performed according to the departmental dose-optimization program which includes automated exposure control, adjustment of the mA and/or kV according to patient size and/or use of iterative reconstruction technique. COMPARISON:  Brain MRI 12/20/2020 FINDINGS: Brain: No evidence of acute infarction, hemorrhage, hydrocephalus, extra-axial collection or mass lesion/mass effect. Generalized brain atrophy with chronic small vessel ischemia in the cerebral white matter. 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 Simple cyst centered at the lower left sylvian fissure and middle cranial fossa with mass effect on the adjacent brain, up to 7 cm. Vascular: No hyperdense vessel or unexpected calcification. Skull: Normal. Negative for fracture or focal lesion. Sinuses/Orbits: No acute finding. IMPRESSION: No acute finding or change since 2022, as described. Electronically Signed   By: Ronnette Coke M.D.   On: 04/24/2024 10:10    Microbiology:  Results for orders placed or performed during the hospital encounter of 04/24/24  Urine Culture (for pregnant, neutropenic or urologic patients or patients with an indwelling urinary catheter)     Status: None   Collection Time: 04/25/24  2:27 PM   Specimen: Urine, Clean Catch  Result Value Ref Range Status   Specimen Description   Final    URINE, CLEAN CATCH Performed at [facility], [address], [place], [place] [address]    Special Requests   Final    NONE Performed at [facility], [address], [place], [place] [address]    Culture   Final    NO GROWTH Performed at [facility], [address], [place], [place] [address]    Report Status 04/26/2024 FINAL  Final  Urine Culture (for pregnant, neutropenic  or urologic patients or patients with an indwelling urinary catheter)     Status: None   Collection Time: 04/27/24 12:25 AM   Specimen: Urine, Catheterized  Result Value Ref Range Status   Specimen Description   Final    URINE, CATHETERIZED Performed at [facility], [address] [place], [place] [address]    Special Requests   Final    NONE Performed at [facility], [address] [place], [place] [address]    Culture   Final    NO GROWTH Performed at [facility], [address], [place], [place] [address]    Report Status 04/28/2024 FINAL  Final  Surgical PCR screen     Status: Abnormal   Collection Time: 04/27/24  9:42 PM   Specimen: Nasal Mucosa; Nasal Swab  Result Value Ref Range Status   MRSA, PCR NEGATIVE NEGATIVE Final   Staphylococcus aureus POSITIVE (A) NEGATIVE Final    Comment: (NOTE) The Xpert SA Assay (FDA approved for NASAL specimens in patients 46 years of age and older), is one component of a comprehensive surveillance program. It is not intended to diagnose infection nor to guide or monitor treatment. Performed at [facility], [address] [place], [place] [address]     Labs: CBC: Recent Labs  Lab 04/29/24 0400 04/30/24 0340 05/01/24 0338 05/02/24 0337 05/03/24 0305 05/04/24 0324  WBC 6.7 5.9 6.6 5.7 5.9 5.5  NEUTROABS 5.2  --   --   --   --   --   HGB 12.2* 11.7* 12.3* 11.9* 12.0* 12.0*  HCT 39.9 38.5* 39.3 38.3* 37.8* 37.5*  MCV 106.4* 107.5* 104.5* 105.5* 105.3* 103.6*  PLT 169 162 180 174 179 182   Basic Metabolic Panel: Recent Labs  Lab 04/30/24 0340 05/01/24 0338 05/02/24 0337 05/03/24 0305 05/04/24 0324  NA 145 146* 147* 145 145  K 3.5 3.4* 3.3* 3.2* 3.7  CL 115* 117* 114* 113* 113*  CO2 25 22 26 25 23   GLUCOSE 122* 106* 103* 116* 106*  BUN 26* 21 21 21 21   CREATININE 0.99 0.66 0.83 0.70 0.77  CALCIUM  8.0* 7.8* 8.0* 7.8* 8.0*   Liver Function  Tests: Recent Labs  Lab 04/30/24 0340 05/01/24 0338 05/02/24 0337 05/03/24 0305 05/04/24 0324  AST 36 34 39 40 38  ALT 33 29 32 36 34  ALKPHOS 82 95 105 113 125  BILITOT 1.0 1.4* 1.6* 1.7* 1.9*  PROT 4.9* 5.1* 5.3* 5.2* 5.1*  ALBUMIN  2.4* 2.3* 2.5* 2.4* 2.4*   CBG: No results for input(s): "GLUCAP" in the last 168 hours.  Discharge time spent:  less than 30 minutes.  Signed: Cherylle Corwin, MD [facility] 05/05/2024

## 2024-05-05 NOTE — Progress Notes (Signed)
 This pt is discharging to hospice now. The day shift RN and the family stated it is ok to leave with his IV sites in and his catheter in. Messaged Denece Finger.

## 2024-05-05 NOTE — Progress Notes (Signed)
  Daily Progress Note   Patient Name: Wayne Walls       Date: 05/05/2024 DOB: 1933/01/20  Age: 89 y.o. MRN#: [id number] Attending Physician: Oral Billings, MD Primary Care Physician: Shannan Dart., FNP Admit Date: 04/24/2024 Length of Stay: 11 days  Reason for Consultation/Follow-up: Establishing goals of care  Subjective:   Discussed care with team including hospitalist, RN, TOC, and [facility] liaison to coordinate care.   When presenting to bedside, [facility] [place] liaison present at bedside and discussed care with multiple family members.  Patient lying in bed, appears chronically ill and is nonverbal.  Planning on transferring to inpatient hospice at [facility] [place] today. RN to provide medication for comfort prior to discharge. Placed a signed DNR form on patient's chart for transfer.  Objective:   Vital Signs:  BP 132/82 (BP Location: Left Arm)   Pulse 80   Temp 98.6 F (37 C) (Oral)   Resp 17   Ht 6\' 1"  (1.854 m)   Wt 80.3 kg   SpO2 93%   BMI 23.36 kg/m   Physical Exam: General: Frail, cachectic, ill-appearing Cardiovascular: RRR Respiratory: no increased work of breathing noted, not in respiratory distress  Assessment & Plan:   Assessment: 89 year old gentleman, history of dementia, resident at [facility] memory care unit, history of COPD depression, admitted with left femoral neck fracture, orthopedics consulted and patient to go for left hip hemiarthroplasty for displaced left hip femoral neck fracture on 04-28-2024. Palliative care consulted for ongoing goals of care discussions.   Recommendations/Plan: # Complex medical decision making/goals of care:  - Patient unable to participate in complex medical decision-making due to underlying medical status.  -Family opted to prior towards temporal on 05/04/2024.  Patient evaluated for inpatient hospice by [facility] [place] and has been accepted to their facility.  Hopefully transfer  today.  TOC assisting with discharge coordination.  -  Code Status: Limited: Do not attempt resuscitation (DNR) -DNR-LIMITED -Do Not Intubate/DNI    - Placed signed DNR form on patient's chart for transfer.  # Discharge Planning: Hospice facility  Thank you for allowing the palliative care team to participate in the care Wayne Walls.  Wayne Libel, DO Palliative Care Provider PMT # [phone]  If patient remains symptomatic despite maximum doses, please call PMT at [phone] between 0700 and 1900. Outside of these hours, please call attending, as PMT does not have night coverage.

## 2024-05-05 NOTE — TOC Transition Note (Signed)
 Transition of Care [facility]) - Discharge Note   Patient Details  Name: Wayne Walls MRN: [id number] Date of Birth: 11/18/1933  Transition of Care [facility]) CM/SW Contact:  Bari Leys, RN Phone Number: 05/05/2024, 3:25 PM   Clinical Narrative:    DC to [facility] in [place], [place], family at bedside. [facility] for transport. No further TOC needs.     Final next level of care: Hospice Medical Facility ([facility]) Barriers to Discharge: Barriers Resolved   Patient Goals and CMS Choice   CMS Medicare.gov Compare Post Acute Care list provided to:: Patient Represenative (must comment) Donnia Galas (son)) Choice offered to / list presented to : Adult Children [place] ownership interest in [facility].provided to:: Adult Children    Discharge Placement                Patient to be transferred to facility by: [facility] Name of family member notified: Mylan Schwarz (son) Patient and family notified of of transfer: 05/05/24  Discharge Plan and Services Additional resources added to the After Visit Summary for                                       Social Drivers of Health (SDOH) Interventions SDOH Screenings   Food Insecurity: Patient Unable To Answer (04/24/2024)  Housing: Patient Unable To Answer (04/24/2024)  Transportation Needs: Patient Unable To Answer (04/24/2024)  Utilities: Patient Unable To Answer (04/24/2024)  Social Connections: Patient Unable To Answer (04/24/2024)  Tobacco Use: Low Risk  (04/28/2024)     Readmission Risk Interventions    05/05/2024    3:23 PM 04/27/2024    3:14 PM  Readmission Risk Prevention Plan  Transportation Screening Complete Complete  PCP or Specialist Appt within 3-5 Days Complete Complete  HRI or Home Care Consult Complete Complete  Social Work Consult for Recovery Care Planning/Counseling Complete Complete  Palliative Care Screening Complete Complete  Medication Review  Oceanographer) Complete Complete

## 2024-05-05 NOTE — Plan of Care (Signed)

## 2024-05-06 NOTE — Progress Notes (Signed)
 No order written for IV and foley removal but NP on call stated he saw a note from Dr. Joan Mouton saying the IV and foley can stay in for transfer to hospice facility.  Pt transferred to hospice facility in [facility] by [facility].

## 2024-06-02 DEATH — deceased
